# Patient Record
Sex: Female | Born: 1940
Health system: Southern US, Community
[De-identification: ages and names within clinical notes are randomized; demographics above are authoritative.]

## PROBLEM LIST (undated history)

## (undated) DIAGNOSIS — E119 Type 2 diabetes mellitus without complications: Secondary | ICD-10-CM

## (undated) DIAGNOSIS — E785 Hyperlipidemia, unspecified: Secondary | ICD-10-CM

## (undated) DIAGNOSIS — I639 Cerebral infarction, unspecified: Secondary | ICD-10-CM

## (undated) DIAGNOSIS — I10 Essential (primary) hypertension: Secondary | ICD-10-CM

---

## 2021-07-10 DIAGNOSIS — Z23 Encounter for immunization: Secondary | ICD-10-CM | POA: Diagnosis not present

## 2021-09-13 ENCOUNTER — Other Ambulatory Visit: Payer: Self-pay

## 2021-09-13 ENCOUNTER — Emergency Department: Payer: Medicare HMO

## 2021-09-13 ENCOUNTER — Emergency Department
Admission: EM | Admit: 2021-09-13 | Discharge: 2021-09-13 | Disposition: A | Discharge status: Home / Self Care | Payer: Medicare HMO | Attending: Emergency Medicine | Admitting: Emergency Medicine

## 2021-09-13 DIAGNOSIS — R001 Bradycardia, unspecified: Secondary | ICD-10-CM | POA: Diagnosis not present

## 2021-09-13 DIAGNOSIS — R531 Weakness: Secondary | ICD-10-CM | POA: Diagnosis not present

## 2021-09-13 DIAGNOSIS — R4781 Slurred speech: Secondary | ICD-10-CM | POA: Diagnosis not present

## 2021-09-13 DIAGNOSIS — R0689 Other abnormalities of breathing: Secondary | ICD-10-CM | POA: Diagnosis not present

## 2021-09-13 DIAGNOSIS — Z20822 Contact with and (suspected) exposure to covid-19: Secondary | ICD-10-CM | POA: Diagnosis not present

## 2021-09-13 DIAGNOSIS — I10 Essential (primary) hypertension: Secondary | ICD-10-CM | POA: Insufficient documentation

## 2021-09-13 DIAGNOSIS — G9389 Other specified disorders of brain: Secondary | ICD-10-CM | POA: Diagnosis not present

## 2021-09-13 DIAGNOSIS — R0902 Hypoxemia: Secondary | ICD-10-CM | POA: Diagnosis not present

## 2021-09-13 DIAGNOSIS — J3489 Other specified disorders of nose and nasal sinuses: Secondary | ICD-10-CM | POA: Diagnosis not present

## 2021-09-13 DIAGNOSIS — Z9889 Other specified postprocedural states: Secondary | ICD-10-CM | POA: Diagnosis not present

## 2021-09-13 DIAGNOSIS — E119 Type 2 diabetes mellitus without complications: Secondary | ICD-10-CM | POA: Diagnosis not present

## 2021-09-13 DIAGNOSIS — R42 Dizziness and giddiness: Secondary | ICD-10-CM | POA: Insufficient documentation

## 2021-09-13 HISTORY — DX: Essential (primary) hypertension: I10

## 2021-09-13 HISTORY — DX: Type 2 diabetes mellitus without complications: E11.9

## 2021-09-13 LAB — HEPATIC FUNCTION PANEL
ALT: 11 U/L (ref 0–44)
AST: 16 U/L (ref 15–41)
Albumin: 3.2 g/dL — ABNORMAL LOW (ref 3.5–5.0)
Alkaline Phosphatase: 54 U/L (ref 38–126)
Bilirubin, Direct: 0.1 mg/dL (ref 0.0–0.2)
Indirect Bilirubin: 0.6 mg/dL (ref 0.3–0.9)
Total Bilirubin: 0.7 mg/dL (ref 0.3–1.2)
Total Protein: 7.3 g/dL (ref 6.5–8.1)

## 2021-09-13 LAB — BASIC METABOLIC PANEL
Anion gap: 6 (ref 5–15)
BUN: 31 mg/dL — ABNORMAL HIGH (ref 8–23)
CO2: 22 mmol/L (ref 22–32)
Calcium: 8.4 mg/dL — ABNORMAL LOW (ref 8.9–10.3)
Chloride: 110 mmol/L (ref 98–111)
Creatinine, Ser: 2.02 mg/dL — ABNORMAL HIGH (ref 0.44–1.00)
GFR, Estimated: 25 mL/min — ABNORMAL LOW (ref >60)
Glucose, Bld: 187 mg/dL — ABNORMAL HIGH (ref 70–99)
Potassium: 3.4 mmol/L — ABNORMAL LOW (ref 3.5–5.1)
Sodium: 138 mmol/L (ref 135–145)

## 2021-09-13 LAB — URINALYSIS, ROUTINE W REFLEX MICROSCOPIC
Bilirubin Urine: NEGATIVE
Glucose, UA: NEGATIVE mg/dL
Ketones, ur: NEGATIVE mg/dL
Nitrite: NEGATIVE
Protein, ur: >300 mg/dL — AB
Specific Gravity, Urine: 1.02 (ref 1.005–1.030)
pH: 5.5 (ref 5.0–8.0)

## 2021-09-13 LAB — RESP PANEL BY RT-PCR (FLU A&B, COVID) ARPGX2
Influenza A by PCR: NEGATIVE
Influenza B by PCR: NEGATIVE
SARS Coronavirus 2 by RT PCR: NEGATIVE

## 2021-09-13 LAB — CBC
HCT: 34.1 % — ABNORMAL LOW (ref 36.0–46.0)
Hemoglobin: 10.8 g/dL — ABNORMAL LOW (ref 12.0–15.0)
MCH: 29.5 pg (ref 26.0–34.0)
MCHC: 31.7 g/dL (ref 30.0–36.0)
MCV: 93.2 fL (ref 80.0–100.0)
Platelets: 146 10*3/uL — ABNORMAL LOW (ref 150–400)
RBC: 3.66 MIL/uL — ABNORMAL LOW (ref 3.87–5.11)
RDW: 14.9 % (ref 11.5–15.5)
WBC: 7.8 10*3/uL (ref 4.0–10.5)
nRBC: 0 % (ref 0.0–0.2)

## 2021-09-13 LAB — TROPONIN I (HIGH SENSITIVITY): Troponin I (High Sensitivity): 10 ng/L (ref <18)

## 2021-09-13 LAB — LIPASE, BLOOD: Lipase: 47 U/L (ref 11–51)

## 2021-09-13 LAB — CBG MONITORING, ED: Glucose-Capillary: 184 mg/dL — ABNORMAL HIGH (ref 70–99)

## 2021-09-13 MED ORDER — SODIUM CHLORIDE 0.9 % IV BOLUS
500.0000 mL | Freq: Once | INTRAVENOUS | Status: AC
  Administered 2021-09-13: 06:00:00 500 mL via INTRAVENOUS

## 2021-09-13 NOTE — ED Notes (Signed)
Patient transported to CT 

## 2021-09-13 NOTE — ED Provider Notes (Signed)
[facility] Emergency Department Provider Note   ____________________________________________   I have reviewed the triage vital signs and the nursing notes.   HISTORY  Chief Complaint Weakness   History limited by: Not Limited   HPI Alicia Garner is a 80 y.o. female who presents to the emergency department today because of concerns for dizziness and weakness that occurred while the patient was using the restroom this morning.  Patient states she felt okay prior to using the restroom. Family stated that when this happened they did notice some slurred speech. The patient had similar episode roughly 6 months previously. No recent illness. The patient denies any chest pain, shortness of breath or headache. States that she feels better at the time of my exam.   Records reviewed. Per medical record review patient has a history of DM, HTN.  Past Medical History:  Diagnosis Date   Diabetes mellitus without complication ([place])    Hypertension     There are no problems to display for this patient.   History reviewed. No pertinent surgical history.  Prior to Admission medications   Not on File    Allergies Patient has no known allergies.  History reviewed. No pertinent family history.  Social History Social History   Tobacco Use   Smoking status: Never   Smokeless tobacco: Never  Vaping Use   Vaping Use: Never used  Substance Use Topics   Alcohol use: Never   Drug use: Never    Review of Systems Constitutional: No fever/chills Eyes: No visual changes. ENT: No sore throat. Cardiovascular: Denies chest pain. Respiratory: Denies shortness of breath. Gastrointestinal: No abdominal pain.  No nausea, no vomiting.  No diarrhea.   Genitourinary: Negative for dysuria. Musculoskeletal: Negative for back pain. Skin: Negative for rash. Neurological: Positive for dizziness, generalized  weakness.  ____________________________________________   PHYSICAL EXAM:  VITAL SIGNS: ED Triage Vitals  Enc Vitals Group     BP 09/13/21 0428 (!) 154/84     Pulse Rate 09/13/21 0428 93     Resp 09/13/21 0428 18     Temp 09/13/21 0428 98.4 F (36.9 C)     Temp Source 09/13/21 0428 Oral     SpO2 09/13/21 0428 96 %     Weight 09/13/21 0429 160 lb (72.6 kg)     Height 09/13/21 0429 5\' 4"  (1.626 m)     Head Circumference --      Peak Flow --      Pain Score 09/13/21 0429 0   Constitutional: Alert and oriented.  Eyes: Conjunctivae are normal.  ENT      Head: Normocephalic and atraumatic.      Nose: No congestion/rhinnorhea.      Mouth/Throat: Mucous membranes are moist.      Neck: No stridor. Hematological/Lymphatic/Immunilogical: No cervical lymphadenopathy. Cardiovascular: Normal rate, regular rhythm.  No murmurs, rubs, or gallops.  Respiratory: Normal respiratory effort without tachypnea nor retractions. Breath sounds are clear and equal bilaterally. No wheezes/rales/rhonchi. Gastrointestinal: Soft and non tender. No rebound. No guarding.  Genitourinary: Deferred Musculoskeletal: Normal range of motion in all extremities. No lower extremity edema. Neurologic:  Normal speech and language. No gross focal neurologic deficits are appreciated.  Skin:  Skin is warm, dry and intact. No rash noted. Psychiatric: Mood and affect are normal. Speech and behavior are normal. Patient exhibits appropriate insight and judgment.  ____________________________________________    LABS (pertinent positives/negatives)  Trop hs 10 Lipase 47 BMP na 138, k 3.4, glu 187, cr 2.02  CBC wbc 7.8, hgb 10.8, plt 146  ____________________________________________   EKG  I, Nance Pear, attending physician, personally viewed and interpreted this EKG  EKG Time: 0430 Rate: 92 Rhythm: normal sinus rhythm Axis: left axis deviation Intervals: qtc 479 QRS: narrow, LVH ST changes: no st  elevation Impression: abnormal ekg  ____________________________________________    RADIOLOGY  CXR Low lung volumes, no active disease  ____________________________________________   PROCEDURES  Procedures  ____________________________________________   INITIAL IMPRESSION / ASSESSMENT AND PLAN / ED COURSE  Pertinent labs & imaging results that were available during my care of the patient were reviewed by me and considered in my medical decision making (see chart for details).   Patient presented to the emergency department today because of concerns for an episode of lightheadedness dizziness and family stated they noticed some slurred speech.  Had similar episode roughly 6 months ago.  At time of my exam patient states she does feel better.  Blood work here without any concerning abnormalities.  Potassium is very minimally low but not to the level that I would think would explain the patient's symptoms.  Given family reported history of some slurred speech did check an MRI to evaluate for any acute stroke.  This was negative.  Do wonder if patient had an episode of vasovagal syncope.  Troponin was negative.  Awaiting urine at time of signout.  Additionally patient will will be given IV fluids.  After fluids will have patient ambulate and I do think if patient feels okay would be reasonable for patient be discharged to follow-up with primary care.   ____________________________________________   FINAL CLINICAL IMPRESSION(S) / ED DIAGNOSES  Final diagnoses:  Weakness     Note: This dictation was prepared with Dragon dictation. Any transcriptional errors that result from this process are unintentional     Nance Pear, MD 09/13/21 0730
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
## 2021-09-13 NOTE — ED Notes (Signed)
Patient in MRI 

## 2021-09-13 NOTE — Discharge Instructions (Addendum)
Please seek medical attention for any high fevers, chest pain, shortness of breath, change in behavior, persistent vomiting, bloody stool or any other new or concerning symptoms.  

## 2021-09-13 NOTE — ED Provider Notes (Signed)
----------------------------------------- °  9:50 AM on 09/13/2021 ----------------------------------------- Patient is ambulated well.  Urinalysis does not appear to show urinary tract infection.  Urine culture has been added on as a precaution.  Given the patient's reassuring work-up including a negative MRI we will discharge from the emergency department with PCP follow-up.  Discussed with the patient plenty of fluids and rest of neck several days.  Patient agreeable to plan of care.   Harvest Dark, MD 09/13/21 [phone]

## 2021-09-13 NOTE — ED Notes (Signed)
Urine sent to lab- pt walked to toilet with steady gait

## 2021-09-14 LAB — URINE CULTURE: Culture: 40000 — AB

## 2021-10-07 DIAGNOSIS — R55 Syncope and collapse: Secondary | ICD-10-CM | POA: Diagnosis not present

## 2021-10-07 DIAGNOSIS — R42 Dizziness and giddiness: Secondary | ICD-10-CM | POA: Diagnosis not present

## 2021-10-07 DIAGNOSIS — F0390 Unspecified dementia without behavioral disturbance: Secondary | ICD-10-CM | POA: Diagnosis not present

## 2021-10-07 DIAGNOSIS — R8290 Unspecified abnormal findings in urine: Secondary | ICD-10-CM | POA: Diagnosis not present

## 2021-10-07 DIAGNOSIS — E119 Type 2 diabetes mellitus without complications: Secondary | ICD-10-CM | POA: Diagnosis not present

## 2021-10-14 ENCOUNTER — Emergency Department: Payer: Medicare HMO

## 2021-10-14 ENCOUNTER — Encounter: Payer: Self-pay | Admitting: Emergency Medicine

## 2021-10-14 ENCOUNTER — Emergency Department
Admission: EM | Admit: 2021-10-14 | Discharge: 2021-10-14 | Disposition: A | Discharge status: Home / Self Care | Payer: Medicare HMO | Attending: Emergency Medicine | Admitting: Emergency Medicine

## 2021-10-14 ENCOUNTER — Other Ambulatory Visit: Payer: Self-pay

## 2021-10-14 ENCOUNTER — Ambulatory Visit: Admission: EM | Admit: 2021-10-14 | Discharge: 2021-10-14 | Payer: Medicare HMO

## 2021-10-14 DIAGNOSIS — I10 Essential (primary) hypertension: Secondary | ICD-10-CM

## 2021-10-14 DIAGNOSIS — R410 Disorientation, unspecified: Secondary | ICD-10-CM | POA: Diagnosis present

## 2021-10-14 DIAGNOSIS — E1140 Type 2 diabetes mellitus with diabetic neuropathy, unspecified: Secondary | ICD-10-CM | POA: Insufficient documentation

## 2021-10-14 DIAGNOSIS — T59811A Toxic effect of smoke, accidental (unintentional), initial encounter: Secondary | ICD-10-CM | POA: Insufficient documentation

## 2021-10-14 DIAGNOSIS — F0390 Unspecified dementia without behavioral disturbance: Secondary | ICD-10-CM | POA: Diagnosis not present

## 2021-10-14 DIAGNOSIS — E860 Dehydration: Secondary | ICD-10-CM | POA: Diagnosis not present

## 2021-10-14 DIAGNOSIS — N189 Chronic kidney disease, unspecified: Secondary | ICD-10-CM | POA: Diagnosis not present

## 2021-10-14 DIAGNOSIS — J705 Respiratory conditions due to smoke inhalation: Secondary | ICD-10-CM | POA: Insufficient documentation

## 2021-10-14 DIAGNOSIS — I129 Hypertensive chronic kidney disease with stage 1 through stage 4 chronic kidney disease, or unspecified chronic kidney disease: Secondary | ICD-10-CM | POA: Diagnosis not present

## 2021-10-14 DIAGNOSIS — R0602 Shortness of breath: Secondary | ICD-10-CM | POA: Diagnosis not present

## 2021-10-14 LAB — CBC WITH DIFFERENTIAL/PLATELET
Abs Immature Granulocytes: 0.02 10*3/uL (ref 0.00–0.07)
Basophils Absolute: 0 10*3/uL (ref 0.0–0.1)
Basophils Relative: 0 %
Eosinophils Absolute: 0.1 10*3/uL (ref 0.0–0.5)
Eosinophils Relative: 1 %
HCT: 34.5 % — ABNORMAL LOW (ref 36.0–46.0)
Hemoglobin: 11.1 g/dL — ABNORMAL LOW (ref 12.0–15.0)
Immature Granulocytes: 0 %
Lymphocytes Relative: 57 %
Lymphs Abs: 3.9 10*3/uL (ref 0.7–4.0)
MCH: 29.8 pg (ref 26.0–34.0)
MCHC: 32.2 g/dL (ref 30.0–36.0)
MCV: 92.7 fL (ref 80.0–100.0)
Monocytes Absolute: 0.7 10*3/uL (ref 0.1–1.0)
Monocytes Relative: 10 %
Neutro Abs: 2.1 10*3/uL (ref 1.7–7.7)
Neutrophils Relative %: 32 %
Platelets: 168 10*3/uL (ref 150–400)
RBC: 3.72 MIL/uL — ABNORMAL LOW (ref 3.87–5.11)
RDW: 14.7 % (ref 11.5–15.5)
WBC: 6.7 10*3/uL (ref 4.0–10.5)
nRBC: 0 % (ref 0.0–0.2)

## 2021-10-14 LAB — BASIC METABOLIC PANEL
Anion gap: 10 (ref 5–15)
BUN: 35 mg/dL — ABNORMAL HIGH (ref 8–23)
CO2: 22 mmol/L (ref 22–32)
Calcium: 9.1 mg/dL (ref 8.9–10.3)
Chloride: 108 mmol/L (ref 98–111)
Creatinine, Ser: 2.46 mg/dL — ABNORMAL HIGH (ref 0.44–1.00)
GFR, Estimated: 19 mL/min — ABNORMAL LOW (ref >60)
Glucose, Bld: 123 mg/dL — ABNORMAL HIGH (ref 70–99)
Potassium: 3.7 mmol/L (ref 3.5–5.1)
Sodium: 140 mmol/L (ref 135–145)

## 2021-10-14 LAB — COOXEMETRY PANEL
Carboxyhemoglobin: 1.3 % (ref 0.5–1.5)
Methemoglobin: 0.2 % (ref 0.0–1.5)
O2 Saturation: 98.1 %
Total oxygen content: 98.1 mL/dL

## 2021-10-14 LAB — LACTIC ACID, PLASMA
Lactic Acid, Venous: 2.5 mmol/L (ref 0.5–1.9)
Lactic Acid, Venous: 1.7 mmol/L (ref 0.5–1.9)

## 2021-10-14 MED ORDER — LACTATED RINGERS IV BOLUS
1000.0000 mL | Freq: Once | INTRAVENOUS | Status: AC
  Administered 2021-10-14: 21:00:00 1000 mL via INTRAVENOUS
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
## 2021-10-28 DIAGNOSIS — T59811D Toxic effect of smoke, accidental (unintentional), subsequent encounter: Secondary | ICD-10-CM | POA: Diagnosis not present

## 2021-10-28 DIAGNOSIS — R8290 Unspecified abnormal findings in urine: Secondary | ICD-10-CM | POA: Diagnosis not present

## 2021-10-28 DIAGNOSIS — I10 Essential (primary) hypertension: Secondary | ICD-10-CM | POA: Diagnosis not present

## 2021-10-28 DIAGNOSIS — T59811A Toxic effect of smoke, accidental (unintentional), initial encounter: Secondary | ICD-10-CM | POA: Diagnosis not present

## 2021-10-28 DIAGNOSIS — R7989 Other specified abnormal findings of blood chemistry: Secondary | ICD-10-CM | POA: Diagnosis not present

## 2021-12-04 ENCOUNTER — Encounter: Payer: Self-pay | Admitting: Internal Medicine

## 2021-12-04 ENCOUNTER — Emergency Department: Payer: Medicare HMO

## 2021-12-04 ENCOUNTER — Other Ambulatory Visit: Payer: Self-pay

## 2021-12-04 ENCOUNTER — Inpatient Hospital Stay
Admission: EM | Admit: 2021-12-04 | Discharge: 2021-12-08 | DRG: 683 | Disposition: A | Discharge status: Home Health | Payer: Medicare HMO | Attending: Internal Medicine | Admitting: Internal Medicine

## 2021-12-04 DIAGNOSIS — Z66 Do not resuscitate: Secondary | ICD-10-CM

## 2021-12-04 DIAGNOSIS — Z8249 Family history of ischemic heart disease and other diseases of the circulatory system: Secondary | ICD-10-CM | POA: Diagnosis not present

## 2021-12-04 DIAGNOSIS — R0602 Shortness of breath: Secondary | ICD-10-CM | POA: Diagnosis not present

## 2021-12-04 DIAGNOSIS — M2550 Pain in unspecified joint: Secondary | ICD-10-CM | POA: Diagnosis not present

## 2021-12-04 DIAGNOSIS — N184 Chronic kidney disease, stage 4 (severe): Secondary | ICD-10-CM | POA: Diagnosis present

## 2021-12-04 DIAGNOSIS — Z833 Family history of diabetes mellitus: Secondary | ICD-10-CM | POA: Diagnosis not present

## 2021-12-04 DIAGNOSIS — R410 Disorientation, unspecified: Secondary | ICD-10-CM | POA: Diagnosis not present

## 2021-12-04 DIAGNOSIS — Z79899 Other long term (current) drug therapy: Secondary | ICD-10-CM | POA: Diagnosis not present

## 2021-12-04 DIAGNOSIS — N179 Acute kidney failure, unspecified: Secondary | ICD-10-CM | POA: Diagnosis not present

## 2021-12-04 DIAGNOSIS — R945 Abnormal results of liver function studies: Secondary | ICD-10-CM | POA: Diagnosis not present

## 2021-12-04 DIAGNOSIS — R918 Other nonspecific abnormal finding of lung field: Secondary | ICD-10-CM | POA: Diagnosis not present

## 2021-12-04 DIAGNOSIS — Z8673 Personal history of transient ischemic attack (TIA), and cerebral infarction without residual deficits: Secondary | ICD-10-CM | POA: Diagnosis not present

## 2021-12-04 DIAGNOSIS — M1990 Unspecified osteoarthritis, unspecified site: Secondary | ICD-10-CM | POA: Diagnosis present

## 2021-12-04 DIAGNOSIS — N3001 Acute cystitis with hematuria: Secondary | ICD-10-CM | POA: Diagnosis not present

## 2021-12-04 DIAGNOSIS — D631 Anemia in chronic kidney disease: Secondary | ICD-10-CM | POA: Diagnosis present

## 2021-12-04 DIAGNOSIS — M47814 Spondylosis without myelopathy or radiculopathy, thoracic region: Secondary | ICD-10-CM | POA: Diagnosis not present

## 2021-12-04 DIAGNOSIS — R262 Difficulty in walking, not elsewhere classified: Secondary | ICD-10-CM | POA: Diagnosis present

## 2021-12-04 DIAGNOSIS — M109 Gout, unspecified: Secondary | ICD-10-CM | POA: Diagnosis present

## 2021-12-04 DIAGNOSIS — Z7984 Long term (current) use of oral hypoglycemic drugs: Secondary | ICD-10-CM

## 2021-12-04 DIAGNOSIS — B954 Other streptococcus as the cause of diseases classified elsewhere: Secondary | ICD-10-CM | POA: Diagnosis present

## 2021-12-04 DIAGNOSIS — N189 Chronic kidney disease, unspecified: Secondary | ICD-10-CM | POA: Diagnosis not present

## 2021-12-04 DIAGNOSIS — E785 Hyperlipidemia, unspecified: Secondary | ICD-10-CM | POA: Diagnosis present

## 2021-12-04 DIAGNOSIS — E860 Dehydration: Secondary | ICD-10-CM | POA: Diagnosis not present

## 2021-12-04 DIAGNOSIS — D638 Anemia in other chronic diseases classified elsewhere: Secondary | ICD-10-CM | POA: Diagnosis not present

## 2021-12-04 DIAGNOSIS — Z888 Allergy status to other drugs, medicaments and biological substances status: Secondary | ICD-10-CM

## 2021-12-04 DIAGNOSIS — R000 Tachycardia, unspecified: Secondary | ICD-10-CM

## 2021-12-04 DIAGNOSIS — I10 Essential (primary) hypertension: Secondary | ICD-10-CM

## 2021-12-04 DIAGNOSIS — D649 Anemia, unspecified: Secondary | ICD-10-CM | POA: Diagnosis not present

## 2021-12-04 DIAGNOSIS — E1122 Type 2 diabetes mellitus with diabetic chronic kidney disease: Secondary | ICD-10-CM | POA: Diagnosis present

## 2021-12-04 DIAGNOSIS — M7121 Synovial cyst of popliteal space [Baker], right knee: Secondary | ICD-10-CM | POA: Diagnosis not present

## 2021-12-04 DIAGNOSIS — K5900 Constipation, unspecified: Secondary | ICD-10-CM | POA: Diagnosis not present

## 2021-12-04 DIAGNOSIS — I129 Hypertensive chronic kidney disease with stage 1 through stage 4 chronic kidney disease, or unspecified chronic kidney disease: Secondary | ICD-10-CM | POA: Diagnosis not present

## 2021-12-04 DIAGNOSIS — K760 Fatty (change of) liver, not elsewhere classified: Secondary | ICD-10-CM | POA: Diagnosis present

## 2021-12-04 DIAGNOSIS — E1169 Type 2 diabetes mellitus with other specified complication: Secondary | ICD-10-CM

## 2021-12-04 DIAGNOSIS — R531 Weakness: Secondary | ICD-10-CM | POA: Diagnosis not present

## 2021-12-04 DIAGNOSIS — M7989 Other specified soft tissue disorders: Secondary | ICD-10-CM | POA: Diagnosis not present

## 2021-12-04 DIAGNOSIS — R7989 Other specified abnormal findings of blood chemistry: Secondary | ICD-10-CM

## 2021-12-04 HISTORY — DX: Cerebral infarction, unspecified: I63.9

## 2021-12-04 HISTORY — DX: Hyperlipidemia, unspecified: E78.5

## 2021-12-04 LAB — CK: Total CK: 178 U/L (ref 38–234)

## 2021-12-04 LAB — CBC WITH DIFFERENTIAL/PLATELET
Abs Immature Granulocytes: 0.02 10*3/uL (ref 0.00–0.07)
Basophils Absolute: 0 10*3/uL (ref 0.0–0.1)
Basophils Relative: 0 %
Eosinophils Absolute: 0 10*3/uL (ref 0.0–0.5)
Eosinophils Relative: 0 %
HCT: 34.4 % — ABNORMAL LOW (ref 36.0–46.0)
Hemoglobin: 10.9 g/dL — ABNORMAL LOW (ref 12.0–15.0)
Immature Granulocytes: 0 %
Lymphocytes Relative: 24 %
Lymphs Abs: 1.9 10*3/uL (ref 0.7–4.0)
MCH: 29.2 pg (ref 26.0–34.0)
MCHC: 31.7 g/dL (ref 30.0–36.0)
MCV: 92.2 fL (ref 80.0–100.0)
Monocytes Absolute: 0.5 10*3/uL (ref 0.1–1.0)
Monocytes Relative: 6 %
Neutro Abs: 5.5 10*3/uL (ref 1.7–7.7)
Neutrophils Relative %: 70 %
Platelets: 233 10*3/uL (ref 150–400)
RBC: 3.73 MIL/uL — ABNORMAL LOW (ref 3.87–5.11)
RDW: 14.2 % (ref 11.5–15.5)
WBC: 7.9 10*3/uL (ref 4.0–10.5)
nRBC: 0 % (ref 0.0–0.2)

## 2021-12-04 LAB — URIC ACID: Uric Acid, Serum: 8.4 mg/dL — ABNORMAL HIGH (ref 2.5–7.1)

## 2021-12-04 LAB — URINALYSIS, COMPLETE (UACMP) WITH MICROSCOPIC
Bilirubin Urine: NEGATIVE
Glucose, UA: NEGATIVE mg/dL
Ketones, ur: NEGATIVE mg/dL
Nitrite: NEGATIVE
Protein, ur: 100 mg/dL — AB
Specific Gravity, Urine: 1.014 (ref 1.005–1.030)
pH: 5 (ref 5.0–8.0)

## 2021-12-04 LAB — COMPREHENSIVE METABOLIC PANEL
ALT: 173 U/L — ABNORMAL HIGH (ref 0–44)
AST: 92 U/L — ABNORMAL HIGH (ref 15–41)
Albumin: 2.8 g/dL — ABNORMAL LOW (ref 3.5–5.0)
Alkaline Phosphatase: 258 U/L — ABNORMAL HIGH (ref 38–126)
Anion gap: 12 (ref 5–15)
BUN: 50 mg/dL — ABNORMAL HIGH (ref 8–23)
CO2: 22 mmol/L (ref 22–32)
Calcium: 8.9 mg/dL (ref 8.9–10.3)
Chloride: 103 mmol/L (ref 98–111)
Creatinine, Ser: 3.37 mg/dL — ABNORMAL HIGH (ref 0.44–1.00)
GFR, Estimated: 13 mL/min — ABNORMAL LOW (ref >60)
Glucose, Bld: 220 mg/dL — ABNORMAL HIGH (ref 70–99)
Potassium: 3.9 mmol/L (ref 3.5–5.1)
Sodium: 137 mmol/L (ref 135–145)
Total Bilirubin: 0.8 mg/dL (ref 0.3–1.2)
Total Protein: 8.8 g/dL — ABNORMAL HIGH (ref 6.5–8.1)

## 2021-12-04 LAB — GLUCOSE, CAPILLARY
Glucose-Capillary: 191 mg/dL — ABNORMAL HIGH (ref 70–99)
Glucose-Capillary: 234 mg/dL — ABNORMAL HIGH (ref 70–99)

## 2021-12-04 LAB — LACTIC ACID, PLASMA: Lactic Acid, Venous: 1.1 mmol/L (ref 0.5–1.9)

## 2021-12-04 MED ORDER — SODIUM CHLORIDE 0.9 % IV SOLN
1000.0000 mL | Freq: Once | INTRAVENOUS | Status: AC
  Administered 2021-12-04: 1000 mL via INTRAVENOUS

## 2021-12-04 MED ORDER — METHYLPREDNISOLONE SODIUM SUCC 40 MG IJ SOLR
40.0000 mg | INTRAMUSCULAR | Status: AC
  Administered 2021-12-04: 40 mg via INTRAVENOUS
  Filled 2021-12-04: qty 1

## 2021-12-04 MED ORDER — SODIUM CHLORIDE 0.9 % IV SOLN
1.0000 g | INTRAVENOUS | Status: DC
  Administered 2021-12-04 – 2021-12-08 (×5): 1 g via INTRAVENOUS
  Filled 2021-12-04: qty 10
  Filled 2021-12-04: qty 1
  Filled 2021-12-04 (×2): qty 10
  Filled 2021-12-04: qty 1

## 2021-12-04 MED ORDER — PREDNISONE 20 MG PO TABS
20.0000 mg | ORAL_TABLET | Freq: Every day | ORAL | Status: DC
  Administered 2021-12-05 – 2021-12-07 (×3): 20 mg via ORAL
  Filled 2021-12-04 (×4): qty 1

## 2021-12-04 MED ORDER — ACETAMINOPHEN 325 MG PO TABS: 650.0000 mg | ORAL_TABLET | Freq: Four times a day (QID) | ORAL | Status: DC | PRN

## 2021-12-04 MED ORDER — AMLODIPINE BESYLATE 5 MG PO TABS
10.0000 mg | ORAL_TABLET | Freq: Every day | ORAL | Status: DC
  Administered 2021-12-05 – 2021-12-08 (×4): 10 mg via ORAL
  Filled 2021-12-04 (×4): qty 2

## 2021-12-04 MED ORDER — ONDANSETRON HCL 4 MG/2ML IJ SOLN: 4.0000 mg | Freq: Four times a day (QID) | INTRAMUSCULAR | Status: DC | PRN

## 2021-12-04 MED ORDER — COLCHICINE 0.6 MG PO TABS
0.6000 mg | ORAL_TABLET | Freq: Once | ORAL | Status: AC
  Administered 2021-12-04: 0.6 mg via ORAL
  Filled 2021-12-04: qty 1

## 2021-12-04 MED ORDER — ACETAMINOPHEN 650 MG RE SUPP: 650.0000 mg | Freq: Four times a day (QID) | RECTAL | Status: DC | PRN

## 2021-12-04 MED ORDER — HEPARIN SODIUM (PORCINE) 5000 UNIT/ML IJ SOLN
5000.0000 [IU] | Freq: Three times a day (TID) | INTRAMUSCULAR | Status: DC
  Administered 2021-12-04 – 2021-12-08 (×11): 5000 [IU] via SUBCUTANEOUS
  Filled 2021-12-04 (×11): qty 1

## 2021-12-04 MED ORDER — LINAGLIPTIN 5 MG PO TABS
5.0000 mg | ORAL_TABLET | Freq: Every day | ORAL | Status: DC
  Administered 2021-12-05 – 2021-12-08 (×4): 5 mg via ORAL
  Filled 2021-12-04 (×4): qty 1

## 2021-12-04 MED ORDER — INSULIN ASPART 100 UNIT/ML IJ SOLN
0.0000 [IU] | Freq: Every day | INTRAMUSCULAR | Status: DC
  Administered 2021-12-04 – 2021-12-05 (×2): 2 [IU] via SUBCUTANEOUS
  Administered 2021-12-06 – 2021-12-07 (×2): 3 [IU] via SUBCUTANEOUS
  Filled 2021-12-04 (×4): qty 1

## 2021-12-04 MED ORDER — ASPIRIN EC 81 MG PO TBEC
81.0000 mg | DELAYED_RELEASE_TABLET | Freq: Every day | ORAL | Status: DC
  Administered 2021-12-04 – 2021-12-08 (×5): 81 mg via ORAL
  Filled 2021-12-04 (×5): qty 1

## 2021-12-04 MED ORDER — INSULIN ASPART 100 UNIT/ML IJ SOLN
0.0000 [IU] | Freq: Three times a day (TID) | INTRAMUSCULAR | Status: DC
  Administered 2021-12-04: 1 [IU] via SUBCUTANEOUS
  Administered 2021-12-05 (×2): 2 [IU] via SUBCUTANEOUS
  Administered 2021-12-05 – 2021-12-06 (×2): 1 [IU] via SUBCUTANEOUS
  Administered 2021-12-06 – 2021-12-07 (×2): 2 [IU] via SUBCUTANEOUS
  Filled 2021-12-04 (×7): qty 1

## 2021-12-04 MED ORDER — SODIUM CHLORIDE 0.9 % IV SOLN
INTRAVENOUS | Status: DC
Start: 2021-12-04 — End: 2021-12-06

## 2021-12-04 MED ORDER — ONDANSETRON HCL 4 MG PO TABS: 4.0000 mg | ORAL_TABLET | Freq: Four times a day (QID) | ORAL | Status: DC | PRN

## 2021-12-04 MED ORDER — COLCHICINE 0.3 MG HALF TABLET
0.3000 mg | ORAL_TABLET | Freq: Every evening | ORAL | Status: DC
  Filled 2021-12-04: qty 1

## 2021-12-04 NOTE — ED Notes (Signed)
I&O cath patient for urine ?

## 2021-12-04 NOTE — Assessment & Plan Note (Deleted)
Patient made a DNR ?

## 2021-12-04 NOTE — ED Triage Notes (Signed)
BIB EMS from home fo rincreased weakness x 5 days. Home health care person on scene called EMS. Pt is oriented at baseline due to HX of dementia. denies any pain. Wheelchair bound at home.  ?194 BGL - diabetic ?98.7 T ?149/89 ?101 ?96% on RA. ?

## 2021-12-04 NOTE — Assessment & Plan Note (Addendum)
Patient does have a history of arthritis but with uric acid level being 8.4 could be gout.  Patient given Solu-Medrol on admission and given 20 mg of prednisone for the last 2 days.  Will taper down to 10 mg of prednisone.  Colchicine dosed every 3 days secondary to kidney function. ?

## 2021-12-04 NOTE — Assessment & Plan Note (Addendum)
Continue Norvasc.  Hold Cozaar with acute kidney injury.  Added Toprol-XL with fast heart rate with ambulation. ?

## 2021-12-04 NOTE — Assessment & Plan Note (Signed)
Hold Crestor at this point.  Continue sliding scale insulin.  Continue Januvia substitute while here. ?

## 2021-12-04 NOTE — ED Notes (Signed)
Informed RN bed assigned 

## 2021-12-04 NOTE — Assessment & Plan Note (Addendum)
Acute kidney injury on chronic kidney disease stage IV.  Baseline creatinine around 2.46.  Creatinine on admission 3.37.  Creatinine has come down to 2.59. ?

## 2021-12-04 NOTE — Progress Notes (Signed)
Admission profile updated. ?

## 2021-12-04 NOTE — Assessment & Plan Note (Addendum)
Continue to hold Crestor.  Ultrasound right upper quadrant shows fatty infiltration and gallbladder normal.  Hepatitis profile is negative.  LFTs still elevated today. ?

## 2021-12-04 NOTE — H&P (Signed)
?History and Physical  ? ? ?Patient: Alicia Garner VZD:[id number] DOB: 1941-05-18 ?DOA: 12/04/2021 ?DOS: the patient was seen and examined on 12/04/2021 ?PCP: [facility]  ?Patient coming from: Home ? ?Chief Complaint:  ?Chief Complaint  ?Patient presents with  ? Weakness  ? ?HPI: Alicia Garner is a 81 y.o. female with medical history significant of type 2 diabetes mellitus, essential hypertension, chronic kidney disease arthritis and prior history of stroke.  She presents with 8 days of trouble walking.  Daughter states that she has been having some swelling in her joints and some joint pain.  She has been having decreased appetite.  Not eating or drinking very much.  The patient is a poor historian and states she was brought in by her family.  Hospitalist group was contacted for admission secondary to worsening kidney function than her usual.  Creatinine up at 3.37. ? ?Review of Systems: Review of Systems  ?Constitutional:  Positive for malaise/fatigue. Negative for chills and fever.  ?HENT:  Negative for sore throat.   ?Eyes:  Negative for blurred vision.  ?Respiratory:  Negative for shortness of breath.   ?Cardiovascular:  Negative for chest pain.  ?Gastrointestinal:  Negative for abdominal pain, constipation, diarrhea, nausea and vomiting.  ?Genitourinary:  Negative for dysuria and hematuria.  ?Musculoskeletal:  Positive for joint pain.  ?Skin:  Negative for rash.  ?Neurological:  Negative for dizziness.  ?Psychiatric/Behavioral:  Negative for depression.    ?Past Medical History:  ?Diagnosis Date  ? Diabetes mellitus without complication ([place])   ? Hyperlipidemia   ? Hypertension   ? Stroke [facility])   ? ?History reviewed. No pertinent surgical history. ?Social History:  reports that she has never smoked. She has never used smokeless tobacco. She reports that she does not drink alcohol and does not use drugs. ? ?Allergies  ?Allergen Reactions  ? Lisinopril Swelling  ? ? ?Family History  ?Problem Relation Age of  Onset  ? Hypertension Mother   ? Diabetes Mother   ? Diabetes Father   ? Hypertension Father   ? ? ?Prior to Admission medications   ?Medication Sig Start Date End Date Taking? Authorizing Provider  ?amLODipine (NORVASC) 10 MG tablet Take 10 mg by mouth daily. 10/10/21  Yes [provider]  ?Cholecalciferol 25 MCG (1000 UT) capsule Take by mouth. 12/22/18  Yes [provider]  ?losartan (COZAAR) 50 MG tablet Take 50 mg by mouth daily. 10/10/21  Yes [provider]  ?rosuvastatin (CRESTOR) 20 MG tablet Take by mouth. 09/18/21 09/18/22 Yes [provider]  ?sitaGLIPtin (JANUVIA) 25 MG tablet Take 1 tablet (25 mg total) by mouth once daily For diabetes 04/30/21  Yes [provider]  ?Medication reconciliation still undergoing. ? ?Physical Exam: ?Vitals:  ? 12/04/21 1130 12/04/21 1200 12/04/21 1230 12/04/21 1300  ?BP: (!) 143/80 131/77 (!) 144/97 134/75  ?Pulse: 94 95 95 90  ?Resp: 16 19 19 15   ?Temp:      ?TempSrc:      ?SpO2: 98% 98% 99% 96%  ?Weight:      ?Height:      ? ?Physical Exam ?HENT:  ?   Head: Normocephalic.  ?   Mouth/Throat:  ?   Pharynx: No oropharyngeal exudate.  ?Eyes:  ?   General: Lids are normal.  ?   Conjunctiva/sclera: Conjunctivae normal.  ?Cardiovascular:  ?   Rate and Rhythm: Normal rate and regular rhythm.  ?   Heart sounds: Normal heart sounds, S1 normal and S2 normal.  ?  Pulmonary:  ?   Breath sounds: No decreased breath sounds, wheezing, rhonchi or rales.  ?Abdominal:  ?   Palpations: Abdomen is soft.  ?   Tenderness: There is no abdominal tenderness.  ?Musculoskeletal:  ?   Right ankle: Swelling present.  ?   Left ankle: Swelling present.  ?Skin: ?   General: Skin is warm.  ?   Findings: No rash.  ?Neurological:  ?   Mental Status: She is alert.  ?   Comments: Difficulty with straight leg raise.  ?  ?Data Reviewed: ?Laboratory and radiological data reviewed during this hospital course.  Urine analysis positive.  EKG interpreted by me normal sinus  rhythm 95 bpm LVH, LAFB.  Chest x-ray negative.  Hemoglobin 10.9 BUN 15 creatinine 3.37 AST and ALT slightly elevated at 92 and 173, alkaline phosphatase up at 258.  Total bilirubin normal range. ? ?Assessment and Plan: ?* Acute kidney injury superimposed on CKD ([place]) ?Acute kidney injury on chronic kidney disease stage IV.  Baseline creatinine around 2.46.  Creatinine on admission 3.37.  We will give gentle IV fluid hydration. ? ?Acute cystitis with hematuria ?Send off urine culture and start IV Rocephin. ? ?Elevated liver function tests ?Hold Crestor and check LFTs tomorrow. ? ?Pain in joint, multiple sites ?We will give a dose of Solu-Medrol, physical therapy consultation.  Add on a CPK and uric acid. ? ?Type 2 diabetes mellitus with hyperlipidemia ([place]) ?Hold Crestor at this point.  Continue sliding scale insulin.  Continue Januvia substitute while here. ? ?Essential hypertension ?Continue Norvasc.  Hold Cozaar with acute kidney injury. ? ?DNR (do not resuscitate) ?Patient made a DNR ? ? ?Advance Care Planning:   Code Status: DNR  ? ?Family Communication: Spoke with daughter at the bedside ? ?Severity of Illness: ?The appropriate patient status for this patient is INPATIENT. Inpatient status is judged to be reasonable and necessary in order to provide the required intensity of service to ensure the patient's safety. The patient's presenting symptoms, physical exam findings, and initial radiographic and laboratory data in the context of their chronic comorbidities is felt to place them at high risk for further clinical deterioration. Furthermore, it is not anticipated that the patient will be medically stable for discharge from the hospital within 2 midnights of admission.  ? ?* I certify that at the point of admission it is my clinical judgment that the patient will require inpatient hospital care spanning beyond 2 midnights from the point of admission due to high intensity of service, high risk for further  deterioration and high frequency of surveillance required.* ? ?Author: ?Loletha Grayer, MD ?12/04/2021 2:40 PM ? ?For on call review [email or web address].  ?

## 2021-12-04 NOTE — Assessment & Plan Note (Addendum)
Streptococcus anginosus growing out of urine culture.  Continue Rocephin.   ?

## 2021-12-04 NOTE — ED Provider Notes (Signed)
? ?[facility] ?Provider Note ? ? ? Event Date/Time  ? First MD Initiated Contact with Patient 12/04/21 1110   ?  (approximate) ? ? ?History  ? ?Weakness ? ? ?HPI ? ?Alicia Garner is a 81 y.o. female with a history of diabetes, hypertension, CVA who presents with weakness.  Caregiver reports the patient typically is able to ambulate to the bathroom but over the last several days has been unable to get out of the bed.  No fevers reported but has noted foul-smelling urine.  No new acute weakness ?  ? ? ?Physical Exam  ? ?Triage Vital Signs: ?ED Triage Vitals  ?Enc Vitals Group  ?   BP 12/04/21 1110 (!) 143/78  ?   Pulse Rate 12/04/21 1107 100  ?   Resp 12/04/21 1107 18  ?   Temp 12/04/21 1107 99 ?F (37.2 ?C)  ?   Temp Source 12/04/21 1107 Oral  ?   SpO2 12/04/21 1107 99 %  ?   Weight 12/04/21 1108 72.6 kg (160 lb 0.9 oz)  ?   Height 12/04/21 1108 1.626 m (5\' 4" )  ?   Head Circumference --   ?   Peak Flow --   ?   Pain Score --   ?   Pain Loc --   ?   Pain Edu? --   ?   Excl. in [place]? --   ? ? ?Most recent vital signs: ?Vitals:  ? 12/04/21 1330 12/04/21 1430  ?BP: 132/80 (!) 136/91  ?Pulse: 90 88  ?Resp: 16 16  ?Temp:  99 ?F (37.2 ?C)  ?SpO2: 98% 98%  ? ? ? ?General: Awake, no distress.  ?CV:  Good peripheral perfusion.  ?Resp:  Normal effort.  ?Abd:  No distention.  ?Other:   ? ? ?ED Results / Procedures / Treatments  ? ?Labs ?(all labs ordered are listed, but only abnormal results are displayed) ?Labs Reviewed  ?COMPREHENSIVE METABOLIC PANEL - Abnormal; Notable for the following components:  ?    Result Value  ? Glucose, Bld 220 (*)   ? BUN 50 (*)   ? Creatinine, Ser 3.37 (*)   ? Total Protein 8.8 (*)   ? Albumin 2.8 (*)   ? AST 92 (*)   ? ALT 173 (*)   ? Alkaline Phosphatase 258 (*)   ? GFR, Estimated 13 (*)   ? All other components within normal limits  ?CBC WITH DIFFERENTIAL/PLATELET - Abnormal; Notable for the following components:  ? RBC 3.73 (*)   ? Hemoglobin 10.9 (*)   ? HCT 34.4 (*)   ? All  other components within normal limits  ?URINALYSIS, COMPLETE (UACMP) WITH MICROSCOPIC - Abnormal; Notable for the following components:  ? Color, Urine YELLOW (*)   ? APPearance CLOUDY (*)   ? Hgb urine dipstick SMALL (*)   ? Protein, ur 100 (*)   ? Leukocytes,Ua LARGE (*)   ? Bacteria, UA FEW (*)   ? All other components within normal limits  ?URIC ACID - Abnormal; Notable for the following components:  ? Uric Acid, Serum 8.4 (*)   ? All other components within normal limits  ?URINE CULTURE  ?LACTIC ACID, PLASMA  ?CK  ?HEMOGLOBIN A1C  ? ? ? ?EKG ? ?ED ECG REPORT ?I, Lavonia Drafts, the attending physician, personally viewed and interpreted this ECG. ? ?Date: 12/04/2021 ? ?Rhythm: normal sinus rhythm ?QRS Axis: normal ?Intervals: normal ?ST/T Wave abnormalities: normal ?Narrative Interpretation: no evidence of acute  ischemia ? ? ? ?RADIOLOGY ?Chest x-ray viewed interpreted by me, no acute abnormality ? ? ? ?PROCEDURES: ? ?Critical Care performed:  ? ?Procedures ? ? ?MEDICATIONS ORDERED IN ED: ?Medications  ?methylPREDNISolone sodium succinate (SOLU-MEDROL) 40 mg/mL injection 40 mg (has no administration in time range)  ?cefTRIAXone (ROCEPHIN) 1 g in sodium chloride 0.9 % 100 mL IVPB (has no administration in time range)  ?amLODipine (NORVASC) tablet 10 mg (has no administration in time range)  ?linagliptin (TRADJENTA) tablet 5 mg (has no administration in time range)  ?aspirin EC tablet 81 mg (has no administration in time range)  ?insulin aspart (novoLOG) injection 0-6 Units (has no administration in time range)  ?insulin aspart (novoLOG) injection 0-5 Units (has no administration in time range)  ?heparin injection 5,000 Units (has no administration in time range)  ?acetaminophen (TYLENOL) tablet 650 mg (has no administration in time range)  ?  Or  ?acetaminophen (TYLENOL) suppository 650 mg (has no administration in time range)  ?ondansetron (ZOFRAN) tablet 4 mg (has no administration in time range)  ?  Or   ?ondansetron (ZOFRAN) injection 4 mg (has no administration in time range)  ?0.9 %  sodium chloride infusion (has no administration in time range)  ?0.9 %  sodium chloride infusion (1,000 mLs Intravenous New Bag/Given 12/04/21 1430)  ? ? ? ?IMPRESSION / MDM / ASSESSMENT AND PLAN / ED COURSE  ?I reviewed the triage vital signs and the nursing notes. ? ? ?Patient presents with generalized weakness as detailed above.  Overall well-appearing and in no acute distress, will obtain labs, urine, chest x-ray ? ?Lab work is notable for elevated BUN of 50, creatinine of 3.37, this is significantly increased from her baseline of about 2 ? ? ?Lactic acid is normal, white blood cell count is normal. ? ?Patient will require admission for acute on chronic kidney disease, IV fluids have been started.  I have discussed with the hospitalist for admission ? ?  ? ? ?FINAL CLINICAL IMPRESSION(S) / ED DIAGNOSES  ? ?Final diagnoses:  ?AKI (acute kidney injury) ([place])  ?Generalized weakness  ?Dehydration  ? ? ? ?Rx / DC Orders  ? ?ED Discharge Orders   ? ? None  ? ?  ? ? ? ?Note:  This document was prepared using Dragon voice recognition software and may include unintentional dictation errors. ?  ?Lavonia Drafts, MD ?12/04/21 1505 ? ?

## 2021-12-05 DIAGNOSIS — M2550 Pain in unspecified joint: Secondary | ICD-10-CM | POA: Diagnosis not present

## 2021-12-05 DIAGNOSIS — D638 Anemia in other chronic diseases classified elsewhere: Secondary | ICD-10-CM

## 2021-12-05 DIAGNOSIS — N179 Acute kidney failure, unspecified: Secondary | ICD-10-CM | POA: Diagnosis not present

## 2021-12-05 DIAGNOSIS — N3001 Acute cystitis with hematuria: Secondary | ICD-10-CM | POA: Diagnosis not present

## 2021-12-05 DIAGNOSIS — R7989 Other specified abnormal findings of blood chemistry: Secondary | ICD-10-CM | POA: Diagnosis not present

## 2021-12-05 DIAGNOSIS — D649 Anemia, unspecified: Secondary | ICD-10-CM

## 2021-12-05 LAB — GLUCOSE, CAPILLARY
Glucose-Capillary: 170 mg/dL — ABNORMAL HIGH (ref 70–99)
Glucose-Capillary: 221 mg/dL — ABNORMAL HIGH (ref 70–99)
Glucose-Capillary: 224 mg/dL — ABNORMAL HIGH (ref 70–99)
Glucose-Capillary: 210 mg/dL — ABNORMAL HIGH (ref 70–99)

## 2021-12-05 LAB — COMPREHENSIVE METABOLIC PANEL
ALT: 178 U/L — ABNORMAL HIGH (ref 0–44)
AST: 107 U/L — ABNORMAL HIGH (ref 15–41)
Albumin: 2.2 g/dL — ABNORMAL LOW (ref 3.5–5.0)
Alkaline Phosphatase: 249 U/L — ABNORMAL HIGH (ref 38–126)
Anion gap: 9 (ref 5–15)
BUN: 51 mg/dL — ABNORMAL HIGH (ref 8–23)
CO2: 22 mmol/L (ref 22–32)
Calcium: 8.9 mg/dL (ref 8.9–10.3)
Chloride: 109 mmol/L (ref 98–111)
Creatinine, Ser: 2.99 mg/dL — ABNORMAL HIGH (ref 0.44–1.00)
GFR, Estimated: 15 mL/min — ABNORMAL LOW (ref >60)
Glucose, Bld: 191 mg/dL — ABNORMAL HIGH (ref 70–99)
Potassium: 4.5 mmol/L (ref 3.5–5.1)
Sodium: 140 mmol/L (ref 135–145)
Total Bilirubin: 0.5 mg/dL (ref 0.3–1.2)
Total Protein: 7.6 g/dL (ref 6.5–8.1)

## 2021-12-05 LAB — CBC
HCT: 29.8 % — ABNORMAL LOW (ref 36.0–46.0)
Hemoglobin: 9.6 g/dL — ABNORMAL LOW (ref 12.0–15.0)
MCH: 29.4 pg (ref 26.0–34.0)
MCHC: 32.2 g/dL (ref 30.0–36.0)
MCV: 91.1 fL (ref 80.0–100.0)
Platelets: 207 10*3/uL (ref 150–400)
RBC: 3.27 MIL/uL — ABNORMAL LOW (ref 3.87–5.11)
RDW: 13.9 % (ref 11.5–15.5)
WBC: 7.9 10*3/uL (ref 4.0–10.5)
nRBC: 0 % (ref 0.0–0.2)

## 2021-12-05 LAB — IRON AND TIBC
Iron: 34 ug/dL (ref 28–170)
Saturation Ratios: 23 % (ref 10.4–31.8)
TIBC: 147 ug/dL — ABNORMAL LOW (ref 250–450)
UIBC: 113 ug/dL

## 2021-12-05 LAB — FERRITIN: Ferritin: 695 ng/mL — ABNORMAL HIGH (ref 11–307)

## 2021-12-05 MED ORDER — COLCHICINE 0.6 MG PO TABS
0.3000 mg | ORAL_TABLET | ORAL | Status: DC
  Administered 2021-12-07: 0.3 mg via ORAL
  Filled 2021-12-05: qty 0.5

## 2021-12-05 NOTE — Evaluation (Addendum)
Physical Therapy Evaluation ?Patient Details ?Name: Alicia Garner ?MRN: [id number] ?DOB: 1941/09/15 ?Today's Date: 12/05/2021 ? ?History of Present Illness ? Pepper Kerrick is a 81 y.o. female with medical history significant of type 2 diabetes mellitus, essential hypertension, chronic kidney disease arthritis and prior history of stroke. Patient came to hospital due to 8 days of trouble walking. Patient is poor historian. Patient admited for acute kidney injury superimposed on CKD stage IV as well as acute cystitis with hematuria and generalized weakness. ?  ?Clinical Impression ? Patient working with OT at edge of bed upon arrival. Proceeded with Co-treat with OT during transfers away from bed and with ambulation due to complexity of condition and safety with transfers. Patient with dementia at baseline and history obtained from daughter Elwin Sleight who is at bedside throughout session. Patient lives with her daughter who works nights. Patient usually sleeps during the night and is assisted by her daughter during the day. She has had no falls in the last 6 months but has gotten weaker and unable to ambulate without assistance in the last week. Prior to hospitalization she was able to ambulate in the home with no AD or with her rollator and use the bathroom. She needed assistance with ADLs and IADLs. She has a low bed and low toilet. Upon PT eval, patient required min A +2 for lines management and safety to complete sit <> stand transfer from bed to chair. She needs assistance to prevent falls when on her feet due to backwards lean. She required cuing for AD/body placement. She ambulated ~ 35 feet with min A +2 (PT assisting patient with balance and RW use, OT with close chair follow and managing lines). Patient takes very small steps and slow gait and it at risk for falling backwards. It appears that patient has experienced a decline in functional mobility and independence and would benefit from short term rehab prior to return  home or would need constant attendance at home with physical assist for transfers and gait. Patient would benefit from skilled physical therapy to address impairments and functional limitations (see PT Problem List below) to work towards stated goals and return to PLOF or maximal functional independence.  ? ?   ? ?Recommendations for follow up therapy are one component of a multi-disciplinary discharge planning process, led by the attending physician.  Recommendations may be updated based on patient status, additional functional criteria and insurance authorization. ? ?Follow Up Recommendations Skilled nursing-short term rehab (<3 hours/day) ? ?  ?Assistance Recommended at Discharge Frequent or constant Supervision/Assistance  ?Patient can return home with the following ? A lot of help with walking and/or transfers;A lot of help with bathing/dressing/bathroom;Assist for transportation;Assistance with cooking/housework ? ?  ?Equipment Recommendations BSC/3in1;Rolling walker (2 wheels)  ?Recommendations for Other Services ?    ?  ?Functional Status Assessment Patient has had a recent decline in their functional status and demonstrates the ability to make significant improvements in function in a reasonable and predictable amount of time.  ? ?  ?Precautions / Restrictions Precautions ?Precautions: Fall ?Restrictions ?Weight Bearing Restrictions: No  ? ?  ? ?Mobility ? Bed Mobility ?  ?  ?  ?  ?  ?  ?  ?General bed mobility comments: patient sitting at EOB with OT upon PT arrival. ?  ? ?Transfers ?Overall transfer level: Needs assistance ?Equipment used: Rolling walker (2 wheels) ?Transfers: Sit to/from Stand ?Sit to Stand: Min assist, +2 safety/equipment ?  ?  ?  ?  ?  ?  General transfer comment: sit <> stand bed to chair with min A+2 for safely and management of lines. Patient demo backwards lean and very small steps. Requires assistance moving RW at times. ?  ? ?Ambulation/Gait ?Ambulation/Gait assistance: Min  assist ?Gait Distance (Feet): 35 Feet ?Assistive device: Rolling walker (2 wheels) ?Gait Pattern/deviations: Decreased stride length, Leaning posteriorly ?  ?  ?  ?General Gait Details: Patient ambulated about 35 feet with min A +2 with one person assisting patient to maintain balance and advance RW at times and 2nd person managing lines and with very close chair follow due to backwards instabiltiy. Patient with weight backwards and at risk for losing balance backwards at any moment and with intermittant assistance from PT to prevent backwards fall. Pateint ambulates very slowly with stooped posture and very short step length, Has difficulty backing up with heavy cuing to touch back of legs to chair before sitting. ? ?Stairs ?  ?  ?  ?  ?  ? ?Wheelchair Mobility ?  ? ?Modified Rankin (Stroke Patients Only) ?  ? ?  ? ?Balance Overall balance assessment: Needs assistance ?Sitting-balance support: Feet supported, Bilateral upper extremity supported ?Sitting balance-Leahy Scale: Poor ?Sitting balance - Comments: patient starts to lean backwards when sitting edge of bed without cuing. ?Postural control: Posterior lean ?Standing balance support: During functional activity, Reliant on assistive device for balance, Bilateral upper extremity supported ?Standing balance-Leahy Scale: Poor ?Standing balance comment: Patient leans backwards while standing/ambulating at RW without cuing. ?  ?  ?  ?  ?  ?  ?  ?  ?  ?  ?  ?   ? ? ? ?Pertinent Vitals/Pain Pain Assessment ?Pain Assessment: Faces ?Faces Pain Scale: No hurt  ? ? ?Home Living Family/patient expects to be discharged to:: Unsure ?Living Arrangements: Children ?Available Help at Discharge: Family;Available PRN/intermittently ?Type of Home: House ?Home Access: Stairs to enter ?  ?Entrance Stairs-Number of Steps: 2 ?  ?Home Layout: One level ?Home Equipment: Rollator (4 wheels) ?Additional Comments: Patient lives with daughter who works nights.  ?  ?Prior Function Prior  Level of Function : Needs assist ?  ?  ?  ?  ?  ?  ?Mobility Comments: Patient ambulates in the home to the bathrom independently using Rollator or no AD. She uses a rollator when going out into the community. ?ADLs Comments: Help with ADLs and IADLs. ?  ? ? ?Hand Dominance  ?   ? ?  ?Extremity/Trunk Assessment  ? Upper Extremity Assessment ?Upper Extremity Assessment: Defer to OT evaluation ?  ? ?Lower Extremity Assessment ?Lower Extremity Assessment: Generalized weakness ?  ? ?Cervical / Trunk Assessment ?Cervical / Trunk Assessment: Kyphotic  ?Communication  ? Communication: No difficulties  ?Cognition Arousal/Alertness: Awake/alert ?Behavior During Therapy: [facility] for tasks assessed/performed ?Overall Cognitive Status: History of cognitive impairments - at baseline ?  ?  ?  ?  ?  ?  ?  ?  ?  ?  ?  ?  ?  ?  ?  ?  ?General Comments: Daughter Elwin Sleight provides history. ?  ?  ? ?  ?General Comments General comments (skin integrity, edema, etc.): HR up to 145 bpm during mobility. ? ?  ?Exercises    ? ?Assessment/Plan  ?  ?PT Assessment Patient needs continued PT services  ?PT Problem List Decreased strength;Cardiopulmonary status limiting activity;Decreased coordination;Decreased cognition;Decreased activity tolerance;Decreased knowledge of use of DME;Decreased balance;Decreased safety awareness;Decreased mobility ? ?   ?  ?PT Treatment Interventions DME  instruction;Balance training;Gait training;Neuromuscular re-education;Cognitive remediation;Stair training;Functional mobility training;Patient/family education;Therapeutic activities;Therapeutic exercise   ? ?PT Goals (Current goals can be found in the Care Plan section)  ?Acute Rehab PT Goals ?Patient Stated Goal: to get better ?PT Goal Formulation: With patient/family ?Time For Goal Achievement: 12/19/21 ?Potential to Achieve Goals: Good ? ?  ?Frequency Min 2X/week ?  ? ? ?Co-evaluation PT/OT/SLP Co-Evaluation/Treatment: Yes ?Reason for Co-Treatment: For  patient/therapist safety;To address functional/ADL transfers ?  ?  ?  ? ? ?  ?AM-PAC PT "6 Clicks" Mobility  ?Outcome Measure Help needed turning from your back to your side while in a flat bed without using bedrails?: A Lit

## 2021-12-05 NOTE — Progress Notes (Signed)
?  Progress Note ? ? ?Patient: Alicia Garner PPI:[id number] DOB: [birth year] DOA: 12/04/2021     1 ?DOS: the patient was seen and examined on 12/05/2021 ?  ? ? ?Assessment and Plan: ?* Acute kidney injury superimposed on CKD ([place]) ?Acute kidney injury on chronic kidney disease stage IV.  Baseline creatinine around 2.46.  Creatinine on admission 3.37.  We will give gentle IV fluid hydration.  Creatinine has come down to 2.99. ? ?Acute cystitis with hematuria ?Send off urine culture and continue IV Rocephin. ? ?Elevated liver function tests ?Hold Crestor.  LFTs still elevated alkaline phosphatase 249 AST 107 and ALT 178.  Will check right upper quadrant sonogram tomorrow morning.  Check hepatitis profiles. ? ?Pain in joint, multiple sites ?Patient does have a history of arthritis but with uric acid level being 8.4 could be gout.  Patient given Solu-Medrol yesterday and will continue prednisone on a daily basis for right now.  Patient given 1 dose of colchicine yesterday we will continue on a daily basis for now.  Physical therapy recommending rehab. ? ?Type 2 diabetes mellitus with hyperlipidemia ([place]) ?Hold Crestor at this point.  Continue sliding scale insulin.  Continue Januvia substitute while here. ? ?Essential hypertension ?Continue Norvasc.  Hold Cozaar with acute kidney injury. ? ?DNR (do not resuscitate) ?DNR ? ?Anemia, unspecified ?We will add on iron studies ? ? ? ? ?  ? ?Subjective: Patient still has some joint pain.  Still with some weakness.  Was not walking for 8 days prior to coming into the hospital.  Poor appetite. ? ?Physical Exam: ?Vitals:  ? 12/04/21 1948 12/05/21 0400 12/05/21 0800 12/05/21 1136  ?BP: (!) 175/90 (!) 149/85 139/89 136/78  ?Pulse: 93 80 73 68  ?Resp:      ?Temp: 99.2 ?F (37.3 ?C) 99 ?F (37.2 ?C) 97.7 ?F (36.5 ?C) 98.2 ?F (36.8 ?C)  ?TempSrc: Oral  Oral Oral  ?SpO2: 100% 100% 100% 100%  ?Weight:      ?Height:      ? ?Physical Exam ?HENT:  ?   Head: Normocephalic.  ?   Mouth/Throat:  ?    Pharynx: No oropharyngeal exudate.  ?Eyes:  ?   General: Lids are normal.  ?   Conjunctiva/sclera: Conjunctivae normal.  ?Cardiovascular:  ?   Rate and Rhythm: Normal rate and regular rhythm.  ?   Heart sounds: Normal heart sounds, S1 normal and S2 normal.  ?Pulmonary:  ?   Breath sounds: No decreased breath sounds, wheezing, rhonchi or rales.  ?Abdominal:  ?   Palpations: Abdomen is soft.  ?   Tenderness: There is no abdominal tenderness.  ?Musculoskeletal:  ?   Right ankle: Swelling present.  ?   Left ankle: Swelling present.  ?Skin: ?   General: Skin is warm.  ?   Findings: No rash.  ?Neurological:  ?   Mental Status: She is alert.  ?   Comments: Difficulty with straight leg raise.  ?  ?Data Reviewed: ?Creatinine down to 2.99, alkaline phosphatase 249, AST 107, ALT 178, total bilirubin 0.5, hemoglobin 9.6 ? ?Family Communication: Spoke with daughter at the bedside ? ?Disposition: ?Status is: Inpatient ?Remains inpatient appropriate because: Physical therapy recommending rehab and treating joint pains with prednisone and colchicine and treating UTI with IV antibiotics and treating acute kidney injury with IV fluids. ? ?Planned Discharge Destination: Rehab ? ?Author: ?Loletha Grayer, MD ?12/05/2021 2:44 PM ? ?For on call review [email or web address].  ?

## 2021-12-05 NOTE — TOC Initial Note (Signed)
Transition of Care (TOC) - Initial/Assessment Note  ? ? ?Patient Details  ?Name: Alicia Garner ?MRN: [id number] ?Date of Birth: [birth year] ? ?Transition of Care (TOC) CM/SW Contact:    ?Quinne Pires E Narissa Beaufort, LCSW ?Phone Number: ?12/05/2021, 3:20 PM ? ?Clinical Narrative:                Call to daughter [name] to discuss SNF rec. ?Patient is from home with [name] who provides transportation. PCP is [facility] in [place] but [name] is trying to switch her to one closer to home. [facility] [place].  ?She stated patient does not want SNF so she will take patient back home, is agreeable to [place], OT, RN, and Aide services. No agency preference. Confirmed home address. Referral made to [facility] with [facility] for [facility]. ?[name] is also agreeable to recs for 3 in 1 and RW. DME ordered through [facility] for delivery to bedside tomorrow 3/19. ? ?Expected Discharge Plan: [place] ?Barriers to Discharge: Continued Medical Work up ? ? ?Patient Goals and CMS Choice ?Patient states their goals for this hospitalization and ongoing recovery are:: home with home health ?CMS Medicare.gov Compare Post Acute Care list provided to:: Patient Represenative (must comment) ?Choice offered to / list presented to : Adult Children ? ?Expected Discharge Plan and Services ?Expected Discharge Plan: [place] ?  ?  ?  ?Living arrangements for the past 2 months: [place] ?                ?DME Arranged: 3-N-1, Walker rolling ?DME Agency: [facility] ?Date DME Agency Contacted: 12/05/21 ?  ?Representative spoke with at DME Agency: Maudie Mercury ?HH Arranged: PT, OT, RN, Nurse's Aide ?[place] Agency: [place] ?Date HH Agency Contacted: 12/05/21 ?  ?Representative spoke with at [place]: Tommi Rumps ? ?Prior Living Arrangements/Services ?Living arrangements for the past 2 months: [place] ?Lives with:: Adult Children ?Patient language and need for interpreter reviewed:: Yes ?Do you  feel safe going back to the place where you live?: Yes      ?Need for Family Participation in Patient Care: Yes (Comment) ?Care giver support system in place?: Yes (comment) ?Current home services: DME ?Criminal Activity/Legal Involvement Pertinent to Current Situation/Hospitalization: No - Comment as needed ? ?Activities of Daily Living ?Home Assistive Devices/Equipment: Gilford Rile (specify type), Dentures (specify type), Eyeglasses, Shower chair with back, Grab bars around toilet, Grab bars in shower ?ADL Screening (condition at time of admission) ?Patient's cognitive ability adequate to safely complete daily activities?: Yes ?Is the patient deaf or have difficulty hearing?: No ?Does the patient have difficulty seeing, even when wearing glasses/contacts?: No ?Does the patient have difficulty concentrating, remembering, or making decisions?: No ?Patient able to express need for assistance with ADLs?: Yes ?Does the patient have difficulty dressing or bathing?: No ?Independently performs ADLs?: No ?Communication: Independent ?Dressing (OT): Independent ?Grooming: Independent ?Feeding: Independent ?Bathing: Needs assistance ?Is this a change from baseline?: Pre-admission baseline ?Toileting: Dependent ?Is this a change from baseline?: Pre-admission baseline ?In/Out Bed: Dependent ?Is this a change from baseline?: Pre-admission baseline ?Walks in Home: Needs assistance ?Is this a change from baseline?: Pre-admission baseline ?Does the patient have difficulty walking or climbing stairs?: Yes ?Weakness of Legs: Both ?Weakness of Arms/Hands: Both ? ?Permission Sought/Granted ?Permission sought to share information with : Customer service manager ?Permission granted to share information with : Yes, Verbal Permission Granted ?   ? Permission granted to share info  w AGENCY: HH, DME agencies ?   ?   ? ?Emotional Assessment ?  ?  ?  ?Orientation: : Fluctuating Orientation (Suspected and/or reported Sundowners) ?Alcohol /  Substance Use: Not Applicable ?Psych Involvement: No (comment) ? ?Admission diagnosis:  Dehydration [E86.0] ?Generalized weakness [R53.1] ?AKI (acute kidney injury) ([place]) [N17.9] ?Acute kidney injury superimposed on CKD ([place]) [N17.9, N18.9] ?Patient Active Problem List  ? Diagnosis Date Noted  ? Anemia, unspecified 12/05/2021  ? Acute kidney injury superimposed on CKD ([place]) 12/04/2021  ? Acute cystitis with hematuria 12/04/2021  ? Elevated liver function tests 12/04/2021  ? Pain in joint, multiple sites 12/04/2021  ? Type 2 diabetes mellitus with hyperlipidemia ([place]) 12/04/2021  ? Essential hypertension 12/04/2021  ? DNR (do not resuscitate) 12/04/2021  ? ?PCP:  [facility] ?Pharmacy:   ?[id number] [place] [place] ?[place] [address] ?Phone: [phone] Fax: [phone] ? ? ? ? ?Social Determinants of Health (SDOH) Interventions ?  ? ?Readmission Risk Interventions ?Readmission Risk Prevention Plan 12/05/2021  ?Transportation Screening Complete  ?PCP or Specialist Appt within 5-7 Days Complete  ?Home Care Screening Complete  ?Medication Review (RN CM) Complete  ? ? ? ?

## 2021-12-05 NOTE — Evaluation (Signed)
Occupational Therapy Evaluation ?Patient Details ?Name: Alicia Garner ?MRN: [id number] ?DOB: Jun 24, 1941 ?Today's Date: 12/05/2021 ? ? ?History of Present Illness Alicia Garner is a 81 y.o. female with medical history significant of type 2 diabetes mellitus, essential hypertension, chronic kidney disease arthritis and prior history of stroke. Patient came to hospital due to 8 days of trouble walking. Patient is poor historian. Patient admited for acute kidney injury superimposed on CKD stage IV as well as acute cystitis with hematuria and generalized weakness.  ? ?Clinical Impression ?  ?[name] was seen for OT evaluation this date, PT joining for end of session. Prior to hospital admission, pt was MOD I for mobility using 4WW as needed. Pt lives with daughter who works night shift, reports assisting with bathing/dressing. Pt presents to acute OT demonstrating impaired ADL performance and functional mobility 2/2 decreased activity tolerance and functional strength/ROM/balance deficits. Pt currently requires MOD A for brief mgmt in standing. MIN A + RW sit<>stand, posterior LOB noted. MIN A x2 + RW for functional mobility ~20 ft, +2 for chair follow and lines mgmt. Requires BUE support standing, unable to progress to standing grooming tasks. Pt would benefit from skilled OT to address noted impairments and functional limitations (see below for any additional details). Upon hospital discharge, recommend STR to maximize pt safety and return to PLOF. ?   ? ?Recommendations for follow up therapy are one component of a multi-disciplinary discharge planning process, led by the attending physician.  Recommendations may be updated based on patient status, additional functional criteria and insurance authorization.  ? ?Follow Up Recommendations ? Skilled nursing-short term rehab (<3 hours/day)  ?  ?Assistance Recommended at Discharge Frequent or constant Supervision/Assistance  ?Patient can return home with the following A lot of  help with walking and/or transfers;A lot of help with bathing/dressing/bathroom;Help with stairs or ramp for entrance ? ?  ?Functional Status Assessment ? Patient has had a recent decline in their functional status and demonstrates the ability to make significant improvements in function in a reasonable and predictable amount of time.  ?Equipment Recommendations ? BSC/3in1  ?  ?Recommendations for Other Services   ? ? ?  ?Precautions / Restrictions Precautions ?Precautions: Fall ?Restrictions ?Weight Bearing Restrictions: No  ? ?  ? ?Mobility Bed Mobility ?Overal bed mobility: Needs Assistance ?Bed Mobility: Supine to Sit ?  ?  ?Supine to sit: Min assist, HOB elevated ?  ?  ?  ?  ? ?Transfers ?Overall transfer level: Needs assistance ?Equipment used: Rolling walker (2 wheels) ?Transfers: Sit to/from Stand ?Sit to Stand: Min assist ?  ?  ?  ?  ?  ?General transfer comment: posterior lean ?  ? ?  ?Balance Overall balance assessment: Needs assistance ?Sitting-balance support: Feet supported, Bilateral upper extremity supported ?Sitting balance-Leahy Scale: Poor ?  ?  ?Standing balance support: During functional activity, Reliant on assistive device for balance, Bilateral upper extremity supported ?Standing balance-Leahy Scale: Poor ?  ?  ?  ?  ?  ?  ?  ?  ?  ?  ?  ?  ?   ? ?ADL either performed or assessed with clinical judgement  ? ?ADL Overall ADL's : Needs assistance/impaired ?  ?  ?  ?  ?  ?  ?  ?  ?  ?  ?  ?  ?  ?  ?  ?  ?  ?  ?  ?General ADL Comments: MOD A for brief mgmt in standing. MIN A RW for ADL  t/f. Requires BUE support standing, unable to progress to standing grooming tasks  ? ? ? ? ?Pertinent Vitals/Pain Pain Assessment ?Pain Assessment: No/denies pain  ? ? ? ?Hand Dominance Right ?  ?Extremity/Trunk Assessment Upper Extremity Assessment ?Upper Extremity Assessment: Generalized weakness ?  ?Lower Extremity Assessment ?Lower Extremity Assessment: Generalized weakness ?  ?Cervical / Trunk  Assessment ?Cervical / Trunk Assessment: Kyphotic ?  ?Communication Communication ?Communication: No difficulties ?  ?Cognition Arousal/Alertness: Awake/alert ?Behavior During Therapy: [facility] for tasks assessed/performed ?Overall Cognitive Status: History of cognitive impairments - at baseline ?  ?  ?  ?  ?  ?  ?  ?  ?  ?  ?  ?  ?  ?  ?  ?  ?General Comments: States name and location accurately. Follows step by step cueing ?  ?  ?General Comments  MAX HR 145 during mobility ? ?  ?   ?   ? ? ?Home Living Family/patient expects to be discharged to:: Private residence ?Living Arrangements: Children ?Available Help at Discharge: Family;Available PRN/intermittently ?Type of Home: House ?Home Access: Stairs to enter ?Entrance Stairs-Number of Steps: 2 ?  ?Home Layout: One level ?  ?  ?  ?  ?Bathroom Toilet: Standard ?  ?  ?Home Equipment: Rollator (4 wheels);Grab bars - toilet;Shower seat ?  ?Additional Comments: Daughter works nights ?  ? ?  ?Prior Functioning/Environment Prior Level of Function : Needs assist ?  ?  ?  ?  ?  ?  ?Mobility Comments: Patient ambulates in the home to the bathrom independently using Rollator or no AD. She uses a rollator when going out into the community. ?ADLs Comments: uses depends at baseline, assist for dressing/bathing ?  ? ?  ?  ?OT Problem List: Decreased strength;Decreased range of motion;Decreased activity tolerance;Impaired balance (sitting and/or standing);Decreased safety awareness ?  ?   ?OT Treatment/Interventions: Self-care/ADL training;Therapeutic exercise;Energy conservation;DME and/or AE instruction;Therapeutic activities;Patient/family education;Balance training  ?  ?OT Goals(Current goals can be found in the care plan section) Acute Rehab OT Goals ?Patient Stated Goal: to walk better ?OT Goal Formulation: With patient/family ?Time For Goal Achievement: 12/19/21 ?Potential to Achieve Goals: Good ?ADL Goals ?Pt Will Perform Grooming: with set-up;with supervision;standing ?Pt  Will Perform Lower Body Dressing: with min guard assist;sit to/from stand ?Pt Will Transfer to Toilet: with modified independence;ambulating;bedside commode  ?OT Frequency: Min 3X/week ?  ? ?Co-evaluation PT/OT/SLP Co-Evaluation/Treatment: Yes ?Reason for Co-Treatment: For patient/therapist safety;To address functional/ADL transfers ?PT goals addressed during session: Mobility/safety with mobility;Balance ?OT goals addressed during session: ADL's and self-care ?  ? ?  ?AM-PAC OT "6 Clicks" Daily Activity     ?Outcome Measure Help from another person eating meals?: None ?Help from another person taking care of personal grooming?: A Little ?Help from another person toileting, which includes using toliet, bedpan, or urinal?: A Lot ?Help from another person bathing (including washing, rinsing, drying)?: A Lot ?Help from another person to put on and taking off regular upper body clothing?: A Little ?Help from another person to put on and taking off regular lower body clothing?: A Lot ?6 Click Score: 16 ?  ?End of Session Equipment Utilized During Treatment: Rolling walker (2 wheels) ? ?Activity Tolerance: Patient tolerated treatment well ?Patient left: in chair;with call bell/phone within reach;with chair alarm set;with family/visitor present ? ?OT Visit Diagnosis: Other abnormalities of gait and mobility (R26.89);Muscle weakness (generalized) (M62.81)  ?              ?Time: 6948-5462 ?  OT Time Calculation (min): 31 min ?Charges:  OT General Charges ?$OT Visit: 1 Visit ?OT Evaluation ?$OT Eval Low Complexity: 1 Low ?OT Treatments ?$Self Care/Home Management : 23-37 mins ? ?Dessie Coma, M.S. OTR/L  ?12/05/21, 1:09 PM  ?ascom [phone] ? ?

## 2021-12-05 NOTE — Assessment & Plan Note (Deleted)
We will add on iron studies ?

## 2021-12-05 NOTE — Assessment & Plan Note (Signed)
DNR

## 2021-12-06 ENCOUNTER — Inpatient Hospital Stay: Payer: Medicare HMO

## 2021-12-06 DIAGNOSIS — R7989 Other specified abnormal findings of blood chemistry: Secondary | ICD-10-CM | POA: Diagnosis not present

## 2021-12-06 DIAGNOSIS — M2550 Pain in unspecified joint: Secondary | ICD-10-CM | POA: Diagnosis not present

## 2021-12-06 DIAGNOSIS — N179 Acute kidney failure, unspecified: Secondary | ICD-10-CM | POA: Diagnosis not present

## 2021-12-06 DIAGNOSIS — R531 Weakness: Secondary | ICD-10-CM

## 2021-12-06 DIAGNOSIS — N3001 Acute cystitis with hematuria: Secondary | ICD-10-CM | POA: Diagnosis not present

## 2021-12-06 DIAGNOSIS — D638 Anemia in other chronic diseases classified elsewhere: Secondary | ICD-10-CM

## 2021-12-06 LAB — CBC
HCT: 28.9 % — ABNORMAL LOW (ref 36.0–46.0)
Hemoglobin: 9.4 g/dL — ABNORMAL LOW (ref 12.0–15.0)
MCH: 29.5 pg (ref 26.0–34.0)
MCHC: 32.5 g/dL (ref 30.0–36.0)
MCV: 90.6 fL (ref 80.0–100.0)
Platelets: 236 10*3/uL (ref 150–400)
RBC: 3.19 MIL/uL — ABNORMAL LOW (ref 3.87–5.11)
RDW: 13.9 % (ref 11.5–15.5)
WBC: 10.3 10*3/uL (ref 4.0–10.5)
nRBC: 0 % (ref 0.0–0.2)

## 2021-12-06 LAB — COMPREHENSIVE METABOLIC PANEL
ALT: 279 U/L — ABNORMAL HIGH (ref 0–44)
AST: 206 U/L — ABNORMAL HIGH (ref 15–41)
Albumin: 2.3 g/dL — ABNORMAL LOW (ref 3.5–5.0)
Alkaline Phosphatase: 257 U/L — ABNORMAL HIGH (ref 38–126)
Anion gap: 10 (ref 5–15)
BUN: 60 mg/dL — ABNORMAL HIGH (ref 8–23)
CO2: 19 mmol/L — ABNORMAL LOW (ref 22–32)
Calcium: 8.5 mg/dL — ABNORMAL LOW (ref 8.9–10.3)
Chloride: 109 mmol/L (ref 98–111)
Creatinine, Ser: 2.76 mg/dL — ABNORMAL HIGH (ref 0.44–1.00)
GFR, Estimated: 17 mL/min — ABNORMAL LOW (ref >60)
Glucose, Bld: 148 mg/dL — ABNORMAL HIGH (ref 70–99)
Potassium: 4.2 mmol/L (ref 3.5–5.1)
Sodium: 138 mmol/L (ref 135–145)
Total Bilirubin: 0.5 mg/dL (ref 0.3–1.2)
Total Protein: 7.1 g/dL (ref 6.5–8.1)

## 2021-12-06 LAB — GLUCOSE, CAPILLARY
Glucose-Capillary: 120 mg/dL — ABNORMAL HIGH (ref 70–99)
Glucose-Capillary: 173 mg/dL — ABNORMAL HIGH (ref 70–99)
Glucose-Capillary: 248 mg/dL — ABNORMAL HIGH (ref 70–99)
Glucose-Capillary: 256 mg/dL — ABNORMAL HIGH (ref 70–99)

## 2021-12-06 LAB — URINE CULTURE: Culture: 70000 — AB

## 2021-12-06 LAB — HEPATITIS PANEL, ACUTE
HCV Ab: NONREACTIVE
Hep A IgM: NONREACTIVE
Hep B C IgM: NONREACTIVE
Hepatitis B Surface Ag: NONREACTIVE

## 2021-12-06 MED ORDER — POLYETHYLENE GLYCOL 3350 17 G PO PACK
17.0000 g | PACK | Freq: Every day | ORAL | Status: DC
  Administered 2021-12-06 – 2021-12-08 (×3): 17 g via ORAL
  Filled 2021-12-06 (×3): qty 1

## 2021-12-06 MED ORDER — SODIUM BICARBONATE 650 MG PO TABS
650.0000 mg | ORAL_TABLET | Freq: Two times a day (BID) | ORAL | Status: DC
  Administered 2021-12-06 – 2021-12-08 (×5): 650 mg via ORAL
  Filled 2021-12-06 (×5): qty 1

## 2021-12-06 NOTE — Assessment & Plan Note (Addendum)
Family would like to take home with home health ?

## 2021-12-06 NOTE — Progress Notes (Signed)
?Progress Note ? ? ?Patient: Alicia Garner FGH:[id number] DOB: 11/02/1940 DOA: 12/04/2021     2 ?DOS: the patient was seen and examined on 12/06/2021 ?  ? ? ?Assessment and Plan: ?* Acute kidney injury superimposed on CKD ([place]) ?Acute kidney injury on chronic kidney disease stage IV.  Baseline creatinine around 2.46.  Creatinine on admission 3.37.  Creatinine has come down to 2.76.  We will discontinue IV fluids ? ?Acute cystitis with hematuria ?Streptococcus anginosus growing out of urine culture.  Continue Rocephin.  Await sensitivities. ? ?Elevated liver function tests ?Continue to hold Crestor.  Ultrasound right upper quadrant shows fatty infiltration and gallbladder normal.  Hepatitis profile is negative.  AST and ALT rising at 206 and 279 respectively.  Total bilirubin 0.5 and alkaline phosphatase stable at 257.  Continue to monitor. ? ?Pain in joint, multiple sites ?Patient does have a history of arthritis but with uric acid level being 8.4 could be gout.  Patient given Solu-Medrol on admission we will continue 20 mg of prednisone daily until joint pains are better.  Colchicine dosed every 3 days secondary to kidney function. ? ?Type 2 diabetes mellitus with hyperlipidemia ([place]) ?Hold Crestor at this point.  Continue sliding scale insulin.  Continue Januvia substitute while here. ? ?Essential hypertension ?Continue Norvasc.  Hold Cozaar with acute kidney injury. ? ?DNR (do not resuscitate) ?DNR ? ?Weakness ?Physical therapy recommending rehab but family would like to take the patient home with home health ? ?Anemia of chronic disease ?Hemoglobin 9.4.  Ferritin up at 695. ? ? ? ? ?  ? ?Subjective: Patient feeling a little bit better.  Ate about a third of her breakfast this morning.  Still feeling weak.  Still having some joint pains.  Admitted with acute kidney injury on chronic kidney disease. ? ?Physical Exam: ?Vitals:  ? 12/05/21 1136 12/05/21 1923 12/06/21 0335 12/06/21 0807  ?BP: 136/78 139/78 (!) 147/91  136/81  ?Pulse: 68 86 80 (!) 109  ?Resp:  16 20   ?Temp: 98.2 ?F (36.8 ?C)  98.3 ?F (36.8 ?C) 98.4 ?F (36.9 ?C)  ?TempSrc: Oral     ?SpO2: 100% 100% 100% 100%  ?Weight:      ?Height:      ? ?Physical Exam ?HENT:  ?   Head: Normocephalic.  ?   Mouth/Throat:  ?   Pharynx: No oropharyngeal exudate.  ?Eyes:  ?   General: Lids are normal.  ?   Conjunctiva/sclera: Conjunctivae normal.  ?Cardiovascular:  ?   Rate and Rhythm: Normal rate and regular rhythm.  ?   Heart sounds: Normal heart sounds, S1 normal and S2 normal.  ?Pulmonary:  ?   Breath sounds: No decreased breath sounds, wheezing, rhonchi or rales.  ?Abdominal:  ?   Palpations: Abdomen is soft.  ?   Tenderness: There is no abdominal tenderness.  ?Musculoskeletal:  ?   Right ankle: Swelling present. Decreased range of motion.  ?   Left ankle: Swelling present. Decreased range of motion.  ?Skin: ?   General: Skin is warm.  ?   Findings: No rash.  ?Neurological:  ?   Mental Status: She is alert.  ?   Comments: Difficulty with straight leg raise.  ?  ?Data Reviewed: ?Creatinine 2.76 today, AST up at 206 ALT up at 279 hemoglobin 9.4 ? ?Family Communication: Spoke with daughter at the bedside ? ?Disposition: ?Status is: Inpatient ?Remains inpatient appropriate because: Still awaiting urine culture finalization and would like the patient to be a little  bit stronger prior to disposition.  Creatinine getting closer to baseline but not at baseline yet. ? ?Planned Discharge Destination: Home with Home Health ? ?Author: ?Loletha Grayer, MD ?12/06/2021 1:43 PM ? ?For on call review [email or web address].  ?

## 2021-12-06 NOTE — Assessment & Plan Note (Signed)
Hemoglobin 9.4.  Ferritin up at 695. ?

## 2021-12-07 ENCOUNTER — Inpatient Hospital Stay: Payer: Medicare HMO

## 2021-12-07 DIAGNOSIS — K5900 Constipation, unspecified: Secondary | ICD-10-CM

## 2021-12-07 DIAGNOSIS — N179 Acute kidney failure, unspecified: Secondary | ICD-10-CM | POA: Diagnosis not present

## 2021-12-07 DIAGNOSIS — R000 Tachycardia, unspecified: Secondary | ICD-10-CM

## 2021-12-07 DIAGNOSIS — R7989 Other specified abnormal findings of blood chemistry: Secondary | ICD-10-CM | POA: Diagnosis not present

## 2021-12-07 DIAGNOSIS — N3001 Acute cystitis with hematuria: Secondary | ICD-10-CM | POA: Diagnosis not present

## 2021-12-07 LAB — COMPREHENSIVE METABOLIC PANEL
ALT: 274 U/L — ABNORMAL HIGH (ref 0–44)
AST: 131 U/L — ABNORMAL HIGH (ref 15–41)
Albumin: 2.2 g/dL — ABNORMAL LOW (ref 3.5–5.0)
Alkaline Phosphatase: 236 U/L — ABNORMAL HIGH (ref 38–126)
Anion gap: 7 (ref 5–15)
BUN: 60 mg/dL — ABNORMAL HIGH (ref 8–23)
CO2: 22 mmol/L (ref 22–32)
Calcium: 8.5 mg/dL — ABNORMAL LOW (ref 8.9–10.3)
Chloride: 112 mmol/L — ABNORMAL HIGH (ref 98–111)
Creatinine, Ser: 2.59 mg/dL — ABNORMAL HIGH (ref 0.44–1.00)
GFR, Estimated: 18 mL/min — ABNORMAL LOW (ref >60)
Glucose, Bld: 165 mg/dL — ABNORMAL HIGH (ref 70–99)
Potassium: 4.5 mmol/L (ref 3.5–5.1)
Sodium: 141 mmol/L (ref 135–145)
Total Bilirubin: 0.5 mg/dL (ref 0.3–1.2)
Total Protein: 7 g/dL (ref 6.5–8.1)

## 2021-12-07 LAB — CBC
HCT: 28.9 % — ABNORMAL LOW (ref 36.0–46.0)
Hemoglobin: 9.4 g/dL — ABNORMAL LOW (ref 12.0–15.0)
MCH: 29.9 pg (ref 26.0–34.0)
MCHC: 32.5 g/dL (ref 30.0–36.0)
MCV: 92 fL (ref 80.0–100.0)
Platelets: 220 10*3/uL (ref 150–400)
RBC: 3.14 MIL/uL — ABNORMAL LOW (ref 3.87–5.11)
RDW: 13.8 % (ref 11.5–15.5)
WBC: 9.7 10*3/uL (ref 4.0–10.5)
nRBC: 0 % (ref 0.0–0.2)

## 2021-12-07 LAB — GLUCOSE, CAPILLARY
Glucose-Capillary: 122 mg/dL — ABNORMAL HIGH (ref 70–99)
Glucose-Capillary: 148 mg/dL — ABNORMAL HIGH (ref 70–99)
Glucose-Capillary: 224 mg/dL — ABNORMAL HIGH (ref 70–99)
Glucose-Capillary: 272 mg/dL — ABNORMAL HIGH (ref 70–99)

## 2021-12-07 LAB — D-DIMER, QUANTITATIVE: D-Dimer, Quant: 8.98 ug/mL-FEU — ABNORMAL HIGH (ref 0.00–0.50)

## 2021-12-07 LAB — HEMOGLOBIN A1C
Hgb A1c MFr Bld: 7.2 % — ABNORMAL HIGH (ref 4.8–5.6)
Mean Plasma Glucose: 160 mg/dL

## 2021-12-07 MED ORDER — METOPROLOL TARTRATE 5 MG/5ML IV SOLN
5.0000 mg | INTRAVENOUS | Status: DC | PRN
Start: 2021-12-07 — End: 2021-12-08

## 2021-12-07 MED ORDER — PREDNISONE 10 MG PO TABS
10.0000 mg | ORAL_TABLET | Freq: Every day | ORAL | Status: DC
  Administered 2021-12-08: 10 mg via ORAL
  Filled 2021-12-07: qty 1

## 2021-12-07 MED ORDER — LACTULOSE 10 GM/15ML PO SOLN
30.0000 g | Freq: Once | ORAL | Status: AC
  Administered 2021-12-07: 30 g via ORAL
  Filled 2021-12-07: qty 60

## 2021-12-07 MED ORDER — METOPROLOL SUCCINATE ER 50 MG PO TB24
50.0000 mg | ORAL_TABLET | Freq: Every day | ORAL | Status: DC
  Administered 2021-12-07 – 2021-12-08 (×2): 50 mg via ORAL
  Filled 2021-12-07 (×2): qty 1

## 2021-12-07 NOTE — Progress Notes (Addendum)
PT Cancellation Note ? ?Patient Details ?Name: Alicia Garner ?MRN: [id number] ?DOB: 10-Mar-1941 ? ? ?Cancelled Treatment:    Reason Eval/Treat Not Completed: Other (comment);Patient at procedure or test/unavailable ? ? ?Jonnie Kind, SPT ?12/07/2021, 3:07 PM ?

## 2021-12-07 NOTE — Progress Notes (Signed)
?Progress Note ? ? ?Patient: Alicia Garner BWL:[id number] DOB: Jul 29, 1941 DOA: 12/04/2021     3 ?DOS: the patient was seen and examined on 12/07/2021 ?  ? ?Assessment and Plan: ?* Acute kidney injury superimposed on CKD ([place]) ?Acute kidney injury on chronic kidney disease stage IV.  Baseline creatinine around 2.46.  Creatinine on admission 3.37.  Creatinine has come down to 2.59. ? ?Tachycardia ?Heart rate went up to 140 with physical therapy.  Came down in the 80s with rest.  Toprol XL started.  Watch overnight.  We will get an ultrasound of the lower extremities.  Send off a D-dimer. ? ?Acute cystitis with hematuria ?Streptococcus anginosus growing out of urine culture.  Continue Rocephin.   ? ?Elevated liver function tests ?Continue to hold Crestor.  Ultrasound right upper quadrant shows fatty infiltration and gallbladder normal.  Hepatitis profile is negative.  LFTs still elevated today. ? ?Pain in joint, multiple sites ?Patient does have a history of arthritis but with uric acid level being 8.4 could be gout.  Patient given Solu-Medrol on admission and given 20 mg of prednisone for the last 2 days.  Will taper down to 10 mg of prednisone.  Colchicine dosed every 3 days secondary to kidney function. ? ?Type 2 diabetes mellitus with hyperlipidemia ([place]) ?Hold Crestor at this point.  Continue sliding scale insulin.  Continue Januvia substitute while here. ? ?Essential hypertension ?Continue Norvasc.  Hold Cozaar with acute kidney injury.  Added Toprol-XL with fast heart rate with ambulation. ? ?DNR (do not resuscitate) ?DNR ? ?Constipation ?Dose of lactulose given today on MiraLAX daily already. ? ?Weakness ?Family would like to take home with home health ? ?Anemia of chronic disease ?Hemoglobin 9.4.  Ferritin up at 695. ? ? ? ? ?  ? ?Subjective: Patient feels her joint pains are little bit better.  Still with some decreased appetite.  Still with some weakness.  Has some constipation.  He had with acute kidney injury on  chronic kidney disease. ? ?Physical Exam: ?Vitals:  ? 12/06/21 1645 12/06/21 1942 12/07/21 0421 12/07/21 0815  ?BP: 132/79 (!) 139/92 (!) 156/87 (!) 157/81  ?Pulse: (!) 108 98 74 80  ?Resp:  20 20 16   ?Temp: 97.9 ?F (36.6 ?C) 98.1 ?F (36.7 ?C) (!) 97.5 ?F (36.4 ?C) 98.3 ?F (36.8 ?C)  ?TempSrc:  Oral Oral Oral  ?SpO2: 100% 100% 100% 98%  ?Weight:      ?Height:      ? ?Physical Exam ?HENT:  ?   Head: Normocephalic.  ?   Mouth/Throat:  ?   Pharynx: No oropharyngeal exudate.  ?Eyes:  ?   General: Lids are normal.  ?   Conjunctiva/sclera: Conjunctivae normal.  ?Cardiovascular:  ?   Rate and Rhythm: Normal rate and regular rhythm.  ?   Heart sounds: Normal heart sounds, S1 normal and S2 normal.  ?Pulmonary:  ?   Breath sounds: No decreased breath sounds, wheezing, rhonchi or rales.  ?Abdominal:  ?   Palpations: Abdomen is soft.  ?   Tenderness: There is no abdominal tenderness.  ?Musculoskeletal:  ?   Right ankle: Swelling present. Decreased range of motion.  ?   Left ankle: Swelling present. Decreased range of motion.  ?Skin: ?   General: Skin is warm.  ?   Findings: No rash.  ?Neurological:  ?   Mental Status: She is alert.  ?   Comments: Difficulty with straight leg raise.  ?  ?Data Reviewed: ?Creatinine 2.59 today, AST 131, ALT  274, hemoglobin 9.4 ? ?Family Communication: Spoke with daughter at the bedside ? ?Disposition: ?Status is: Inpatient ?Remains inpatient appropriate because: With faster heart rate with ambulation today we will try to control heart rate a little bit better. ? ?Planned Discharge Destination: Home with Home Health ? ?Author: ?Loletha Grayer, MD ?12/07/2021 1:54 PM ? ?For on call review [email or web address].  ?

## 2021-12-07 NOTE — TOC Progression Note (Signed)
Transition of Care (TOC) - Progression Note  ? ? ?Patient Details  ?Name: Alicia Garner ?MRN: [id number] ?Date of Birth: [birth year] ? ?Transition of Care (TOC) CM/SW Contact  ?Beverly Sessions, RN ?Phone Number: ?12/07/2021, 2:22 PM ? ?Clinical Narrative:    ? ?Confirmed with Danielle at [facility] that [facility] and RW were delivered to room  ? ?Expected Discharge Plan: [place] ?Barriers to Discharge: Continued Medical Work up ? ?Expected Discharge Plan and Services ?Expected Discharge Plan: [place] ?  ?  ?  ?Living arrangements for the past 2 months: [place] ?                ?DME Arranged: 3-N-1, Walker rolling ?DME Agency: [facility] ?Date DME Agency Contacted: 12/05/21 ?  ?Representative spoke with at DME Agency: Maudie Mercury ?HH Arranged: PT, OT, RN, Nurse's Aide ?[place] Agency: [place] ?Date HH Agency Contacted: 12/05/21 ?  ?Representative spoke with at [place]: Tommi Rumps ? ? ?Social Determinants of Health (SDOH) Interventions ?  ? ?Readmission Risk Interventions ?Readmission Risk Prevention Plan 12/05/2021  ?Transportation Screening Complete  ?PCP or Specialist Appt within 5-7 Days Complete  ?Home Care Screening Complete  ?Medication Review (RN CM) Complete  ? ? ?

## 2021-12-07 NOTE — Care Management Important Message (Signed)
Important Message ? ?Patient Details  ?Name: Alicia Garner ?MRN: [id number] ?Date of Birth: 10-28-1940 ? ? ?Medicare Important Message Given:  Yes ? ? ? ? ?Dannette Barbara ?12/07/2021, 11:09 AM ?

## 2021-12-07 NOTE — Assessment & Plan Note (Addendum)
Heart rate went up to 140 with physical therapy.  Came down in the 80s with rest.  Toprol XL started.  Watch overnight.  We will get an ultrasound of the lower extremities.  Send off a D-dimer. ?

## 2021-12-07 NOTE — Progress Notes (Addendum)
Mobility Specialist - Progress Note ? ? 12/07/21 1400  ?Mobility  ?Activity Contraindicated/medical hold  ? ? ? ?Per discussion with OT, pt's HR jumped to 140s with minimal exertion. Pt receiving [place] at this time. Will attempt session another date/time as appropriate.  ? ? ?Kathee Delton ?Mobility Specialist ?12/07/21, 2:46 PM ? ? ? ? ?

## 2021-12-07 NOTE — Progress Notes (Signed)
Mobility Specialist - Progress Note ? ? 12/07/21 1600  ?Mobility  ?Activity Ambulated with assistance in room  ?Level of Assistance Standby assist, set-up cues, supervision of patient - no hands on  ?Assistive Device Front wheel walker  ?Distance Ambulated (ft) 20 ft  ?Activity Response Tolerated well  ?$Mobility charge 1 Mobility  ? ? ?Pre-mobility: 78 HR, 96% SpO2 ?During mobility: 97 HR, 95% SpO2 ?Post-mobility: 110 HR, 96%  SpO2 ? ? ?Pt lying in bed upon arrival, utilizing RA. Min encouragement needed for OOB activity this date. Pt's daughter entered at beginning of session. Pt achieved EOB with minA, does present with fear of falling this date. Pt ambulated in room with supervision, no LOB. Slow gait with short steps. HR monitored throughout with max of 110 bpm. VC for hand placement to stand and retro-stepping to return EOB. Noted anterior lean (daughter reports pt using rollator PTA). Pt does attempt sitting before safe to do so. Fatigued post-activity. Pt returned supine with alarm set, needs in reach.  ? ? ?Kathee Delton ?Mobility Specialist ?12/07/21, 4:51 PM ? ?

## 2021-12-07 NOTE — Progress Notes (Signed)
Occupational Therapy Treatment ?Patient Details ?Name: Alicia Garner ?MRN: [id number] ?DOB: Mar 30, 1941 ?Today's Date: 12/07/2021 ? ? ?History of present illness Alicia Garner is a 81 y.o. female with medical history significant of type 2 diabetes mellitus, essential hypertension, chronic kidney disease arthritis and prior history of stroke. Patient came to hospital due to 8 days of trouble walking. Patient is poor historian. Patient admited for acute kidney injury superimposed on CKD stage IV as well as acute cystitis with hematuria and generalized weakness. ?  ?OT comments ? Pt in bed upon OT arrival, visiting with daughter at bedside.  Pt agreeable to ADL completion with OT, and completed supine to sit with HOB elevated and sequencing and hand placement cues to reach EOB.  Pt's HR jumped from low 80s to hight 140s with transition from supine to sit and scoot toward EOB.  Assisted pt to returned to supine with mod A to lift legs.  HR lowered to low 80s upon return to bed.  Elevated HOB to complete grooming activities at bed level; see below for details.  HR remained in low 80s for grooming activities at bed level.  Daughter having concerns about being able to meet pt's physical needs at home at pt's current level as daughter also has 2 young children of her own whom she cares for.  OT reinforced therapy recommendation for SNF and daughter was in agreement with this plan.  OT left pt in bed, all needs met, all needed supplies within reach, and bed alarm set.  Will continue to follow in the acute setting to work towards OT goals and poc.   ? ?Recommendations for follow up therapy are one component of a multi-disciplinary discharge planning process, led by the attending physician.  Recommendations may be updated based on patient status, additional functional criteria and insurance authorization. ?   ?Follow Up Recommendations ? Skilled nursing-short term rehab (<3 hours/day)  ?  ?Assistance Recommended at Discharge Frequent  or constant Supervision/Assistance  ?Patient can return home with the following ? A lot of help with walking and/or transfers;A lot of help with bathing/dressing/bathroom;Help with stairs or ramp for entrance ?  ?Equipment Recommendations ? BSC/3in1  ?  ?Recommendations for Other Services   ? ?  ?Precautions / Restrictions Precautions ?Precautions: Fall ?Restrictions ?Weight Bearing Restrictions: No ?Other Position/Activity Restrictions: was unable to tolerate OOB activity this day d/t HR jumped from low 80s to high 140s with transition from supine to sit.  ? ? ?  ? ?Mobility Bed Mobility ?Overal bed mobility: Needs Assistance ?Bed Mobility: Supine to Sit, Sit to Supine, Rolling ?Rolling: Mod assist ?  ?Supine to sit: Supervision, HOB elevated ?Sit to supine: Mod assist ?  ?General bed mobility comments: extra time and cues for sequencing and hand positioning to utilize bed rail during transition supine to sit; assisted mod A sit to supine d/t quick jump in HR ?Patient Response: Cooperative ? ?Transfers ?  ?  ?  ?  ?  ?  ?  ?  ?  ?General transfer comment: no OOB transfer attempts today d/t elevated HR ?  ?  ?Balance Overall balance assessment: Needs assistance ?Sitting-balance support: Feet supported, Bilateral upper extremity supported ?Sitting balance-Leahy Scale: Poor ?  ?  ?  ?  ?Standing balance comment: not attempted today d/t elevated HR ?  ?  ?  ?  ?  ?  ?  ?  ?  ?  ?  ?   ? ?ADL either performed or assessed with clinical  judgement  ? ?ADL Overall ADL's : Needs assistance/impaired ?  ?  ?Grooming: Wash/dry hands;Wash/dry face;Oral care;Bed level ?Grooming Details (indicate cue type and reason): had planned to attempt standing at sink but HR was too elevated, so completed in supine with HOB elevated ?  ?  ?  ?  ?  ?  ?  ?  ?  ?  ?  ?  ?  ?  ?  ?  ?  ? ?Extremity/Trunk Assessment Upper Extremity Assessment ?Upper Extremity Assessment: Generalized weakness ?  ?Lower Extremity Assessment ?Lower Extremity  Assessment: Generalized weakness ?  ?Cervical / Trunk Assessment ?Cervical / Trunk Assessment: Kyphotic ?  ? ?Vision Patient Visual Report: No change from baseline ?  ?  ?   ?  ?   ?  ? ?Cognition Arousal/Alertness: Awake/alert ?Behavior During Therapy: [facility] for tasks assessed/performed ?Overall Cognitive Status: History of cognitive impairments - at baseline ?  ?  ?  ?  ?  ?  ?  ?  ?  ?  ?  ?  ?  ?  ?  ?  ?General Comments: Daughter present and pt was unable to accurately verbalize who her daughter was, stating that she was her sister. ?  ?  ?   ?   ? ?  ?   ? ? ?  ?General Comments HR sitting EOB was high 140s; returned to supine and HR decreased to low 80s at rest, HOB elevated  ? ? ?Pertinent Vitals/ Pain       Pain Assessment ?Pain Assessment: No/denies pain ?Pain Score: 0-No pain ? ?Home Living   ?  ?  ?  ?  ?  ?  ?  ?  ?  ?  ?  ?  ?  ?  ?  ?  ?  ?  ? ?  ?Prior Functioning/Environment    ?  ?  ?  ?   ? ?Frequency ? Min 3X/week  ? ? ? ? ?  ?Progress Toward Goals ? ?OT Goals(current goals can now be found in the care plan section) ? Progress towards OT goals: OT to reassess next treatment ? ?Acute Rehab OT Goals ?Patient Stated Goal: To walk better ?OT Goal Formulation: With patient/family ?Time For Goal Achievement: 12/19/21 ?Potential to Achieve Goals: Good  ?Plan Discharge plan remains appropriate   ? ? ? ? ?   ?  ?  ?  ?  ? ?  ?AM-PAC OT "6 Clicks" Daily Activity     ?Outcome Measure ? ? Help from another person eating meals?: None ?Help from another person taking care of personal grooming?: A Little ?Help from another person toileting, which includes using toliet, bedpan, or urinal?: A Lot ?Help from another person bathing (including washing, rinsing, drying)?: A Lot ?Help from another person to put on and taking off regular upper body clothing?: A Little ?Help from another person to put on and taking off regular lower body clothing?: A Lot ?6 Click Score: 16 ? ?  ?End of Session   ? ?OT Visit Diagnosis:  Other abnormalities of gait and mobility (R26.89);Muscle weakness (generalized) (M62.81) ?  ?Activity Tolerance Treatment limited secondary to medical complications (Comment) (elevated HR high 140s) ?  ?Patient Left in bed;with call bell/phone within reach;with bed alarm set;with family/visitor present ?  ?Nurse Communication Other (comment) (RN present for elevated HR during bed mobility; RN stated she would message MD) ?  ? ?   ? ?Time: 1478-2956 ?OT Time Calculation (  min): 31 min ? ?Charges: OT General Charges ?$OT Visit: 1 Visit ?OT Treatments ?$Self Care/Home Management : 23-37 mins ? ?Leta Speller, MS, OTR/L ? ? ?Darleene Cleaver ?12/07/2021, 12:07 PM ?

## 2021-12-07 NOTE — Assessment & Plan Note (Signed)
Dose of lactulose given today on MiraLAX daily already. ?

## 2021-12-07 NOTE — Progress Notes (Signed)
Inpatient Diabetes Program Recommendations ? ?AACE/ADA: New Consensus Statement on Inpatient Glycemic Control (2015) ? ?Target Ranges:  Prepandial:   less than 140 mg/dL ?     Peak postprandial:   less than 180 mg/dL (1-2 hours) ?     Critically ill patients:  140 - 180 mg/dL  ? ? Latest Reference Range & Units 12/05/21 07:57 12/05/21 11:55 12/05/21 16:03 12/05/21 20:13  ?Glucose-Capillary 70 - 99 mg/dL 170 (H) ? ?1 unit Novolog ? 221 (H) ? ?2 units Novolog ? 224 (H) ? ?2 units Novolog ? 210 (H) ? ?2 units Novolog ?  ? ? Latest Reference Range & Units 12/06/21 08:06 12/06/21 11:26 12/06/21 16:43 12/06/21 21:51  ?Glucose-Capillary 70 - 99 mg/dL 120 (H) 173 (H) ? ?1 unit Novolog 248 (H) ? ?2 units Novolog 256 (H) ? ?3 units Novolog  ?(H): Data is abnormally high ? ? ? ?Home DM Meds: Januvia 25 mg daily  ? ? ?Current orders: Novolog 0-6 units TID ac/hs ?Tradjenta 5 mg daily ? ? ? ? ?MD- Note patient getting Prednisone 20 mg Daily.  Having elevated CBGs in the late afternoon likely due to the Prednisone. ? ?Please consider adding low dose Novolog Meal Coverage: ? ?Novolog 2 units TID with meals ? ?HOLD if pt eats <50% meals ? ? ? ?--Will follow patient during hospitalization-- ? ?Wyn Quaker RN, MSN, CDE ?Diabetes Coordinator ?Inpatient Glycemic Control Team ?Team Pager: [phone] (8a-5p) ? ? ? ? ? ?

## 2021-12-08 ENCOUNTER — Inpatient Hospital Stay: Payer: Medicare HMO

## 2021-12-08 ENCOUNTER — Other Ambulatory Visit: Payer: Self-pay | Admitting: Radiology

## 2021-12-08 DIAGNOSIS — N179 Acute kidney failure, unspecified: Secondary | ICD-10-CM | POA: Diagnosis not present

## 2021-12-08 DIAGNOSIS — R000 Tachycardia, unspecified: Secondary | ICD-10-CM | POA: Diagnosis not present

## 2021-12-08 DIAGNOSIS — R7989 Other specified abnormal findings of blood chemistry: Secondary | ICD-10-CM | POA: Diagnosis not present

## 2021-12-08 DIAGNOSIS — N3001 Acute cystitis with hematuria: Secondary | ICD-10-CM | POA: Diagnosis not present

## 2021-12-08 LAB — BASIC METABOLIC PANEL
Anion gap: 6 (ref 5–15)
BUN: 56 mg/dL — ABNORMAL HIGH (ref 8–23)
CO2: 23 mmol/L (ref 22–32)
Calcium: 8.6 mg/dL — ABNORMAL LOW (ref 8.9–10.3)
Chloride: 110 mmol/L (ref 98–111)
Creatinine, Ser: 2.29 mg/dL — ABNORMAL HIGH (ref 0.44–1.00)
GFR, Estimated: 21 mL/min — ABNORMAL LOW (ref >60)
Glucose, Bld: 122 mg/dL — ABNORMAL HIGH (ref 70–99)
Potassium: 4.4 mmol/L (ref 3.5–5.1)
Sodium: 139 mmol/L (ref 135–145)

## 2021-12-08 LAB — GLUCOSE, CAPILLARY
Glucose-Capillary: 120 mg/dL — ABNORMAL HIGH (ref 70–99)
Glucose-Capillary: 132 mg/dL — ABNORMAL HIGH (ref 70–99)

## 2021-12-08 MED ORDER — CEPHALEXIN 500 MG PO CAPS
500.0000 mg | ORAL_CAPSULE | Freq: Two times a day (BID) | ORAL | 0 refills | Status: AC
Start: 2021-12-09 — End: 2021-12-11

## 2021-12-08 MED ORDER — PREDNISONE 5 MG PO TABS: ORAL_TABLET | ORAL | 0 refills | Status: DC

## 2021-12-08 MED ORDER — COLCHICINE 0.6 MG PO TABS: 0.3000 mg | ORAL_TABLET | ORAL | 0 refills | Status: DC

## 2021-12-08 MED ORDER — POLYETHYLENE GLYCOL 3350 17 G PO PACK: 17.0000 g | PACK | Freq: Every day | ORAL | 0 refills | Status: AC | PRN

## 2021-12-08 MED ORDER — METOPROLOL SUCCINATE ER 25 MG PO TB24: 25.0000 mg | ORAL_TABLET | Freq: Every day | ORAL | 0 refills | Status: DC

## 2021-12-08 MED ORDER — TECHNETIUM TO 99M ALBUMIN AGGREGATED
4.1600 | Freq: Once | INTRAVENOUS | Status: AC | PRN
  Administered 2021-12-08: 4.16 via INTRAVENOUS

## 2021-12-08 MED ORDER — SODIUM BICARBONATE 650 MG PO TABS: 650.0000 mg | ORAL_TABLET | Freq: Two times a day (BID) | ORAL | 0 refills | Status: DC

## 2021-12-08 NOTE — TOC Transition Note (Signed)
Transition of Care (TOC) - CM/SW Discharge Note ? ? ?Patient Details  ?Name: Alicia Garner ?MRN: [id number] ?Date of Birth: October 22, 1940 ? ?Transition of Care (TOC) CM/SW Contact:  ?Beverly Sessions, RN ?Phone Number: ?12/08/2021, 2:11 PM ? ? ?Clinical Narrative:    ? ?Patient to discharge home today ?Daughter to transport at discharge ?Cory with [facility] notified of discharge ? ?  ?Barriers to Discharge: Continued Medical Work up ? ? ?Patient Goals and CMS Choice ?Patient states their goals for this hospitalization and ongoing recovery are:: home with home health ?CMS Medicare.gov Compare Post Acute Care list provided to:: Patient Represenative (must comment) ?Choice offered to / list presented to : Adult Children ? ?Discharge Placement ?  ?           ?  ?  ?  ?  ? ?Discharge Plan and Services ?  ?  ?           ?DME Arranged: 3-N-1, Walker rolling ?DME Agency: [employer] ?Date DME Agency Contacted: 12/05/21 ?  ?Representative spoke with at DME Agency: Maudie Mercury ?HH Arranged: PT, OT, RN, Nurse's Aide ?[place] Agency: [place] ?Date HH Agency Contacted: 12/05/21 ?  ?Representative spoke with at [place]: Tommi Rumps ? ?Social Determinants of Health (SDOH) Interventions ?  ? ? ?Readmission Risk Interventions ?Readmission Risk Prevention Plan 12/05/2021  ?Transportation Screening Complete  ?PCP or Specialist Appt within 5-7 Days Complete  ?Home Care Screening Complete  ?Medication Review (RN CM) Complete  ? ? ? ? ? ?

## 2021-12-08 NOTE — Progress Notes (Addendum)
Occupational Therapy Treatment ?Patient Details ?Name: Alicia Garner ?MRN: [id number] ?DOB: 18-Feb-1941 ?Today's Date: 12/08/2021 ? ? ?History of present illness Alicia Garner is a 81 y.o. female with medical history significant of type 2 diabetes mellitus, essential hypertension, chronic kidney disease arthritis and prior history of stroke. Patient came to hospital due to 8 days of trouble walking. Patient is poor historian. Patient admited for acute kidney injury superimposed on CKD stage IV as well as acute cystitis with hematuria and generalized weakness. ?  ?OT comments ? Ms Ra was seen for OT treatment on this date. Upon arrival to room pt reclined in bed, family at bedsdie, pt agreeable to tx. Pt requires MIN A + RW sit<>stand - multiple attempts without assist and unable to achieve lift off ultimately requiring MIN A. MIN A + RW for ~30 ft functional mobility - repeated cues for RW mgmt ultimately requiring MIN A to manage RW for safety. Max HE 91 bpm with mobility. SETUP + SUPERVISION seated grooming tasks. Family in room demonstrates good awareness of proper technique for safe transfer and RW use. Pt making good progress toward goals. Will continue to follow POC. Discharge recommendation remains appropriate, pt may d/c home safely with assist for all OOB mobility.  ?  ? ?Recommendations for follow up therapy are one component of a multi-disciplinary discharge planning process, led by the attending physician.  Recommendations may be updated based on patient status, additional functional criteria and insurance authorization. ?   ?Follow Up Recommendations ? Skilled nursing-short term rehab (<3 hours/day)  ?  ?Assistance Recommended at Discharge Frequent or constant Supervision/Assistance  ?Patient can return home with the following ? A lot of help with walking and/or transfers;A lot of help with bathing/dressing/bathroom;Help with stairs or ramp for entrance ?  ?Equipment Recommendations ? BSC/3in1  ?   ?Recommendations for Other Services   ? ?  ?Precautions / Restrictions Precautions ?Precautions: Fall ?Restrictions ?Weight Bearing Restrictions: No  ? ? ?  ? ?Mobility Bed Mobility ?Overal bed mobility: Needs Assistance ?Bed Mobility: Supine to Sit, Sit to Supine ?  ?  ?Supine to sit: Supervision ?Sit to supine: Supervision ?  ?  ?  ? ?Transfers ?Overall transfer level: Needs assistance ?Equipment used: Rolling walker (2 wheels) ?Transfers: Sit to/from Stand ?Sit to Stand: Min assist ?  ?  ?  ?  ?  ?General transfer comment: multiple attempts without assist and unable to achieve lift off ultimately requiring MIN A. ?  ?  ?Balance Overall balance assessment: Needs assistance ?Sitting-balance support: No upper extremity supported, Feet supported ?Sitting balance-Leahy Scale: Fair ?  ?  ?Standing balance support: During functional activity, Reliant on assistive device for balance, Bilateral upper extremity supported ?Standing balance-Leahy Scale: Fair ?Standing balance comment: constant cueing for safe RW use ?  ?  ?  ?  ?  ?  ?  ?  ?  ?  ?  ?   ? ?ADL either performed or assessed with clinical judgement  ? ?ADL Overall ADL's : Needs assistance/impaired ?  ?  ?  ?  ?  ?  ?  ?  ?  ?  ?  ?  ?  ?  ?  ?  ?  ?  ?  ?General ADL Comments: MIN A + RW for ADL t/f - repeated cues for RW mgmt. SETUP + SUPERVISION seated grooming tasks ?  ? ? ? ?Cognition Arousal/Alertness: Awake/alert ?Behavior During Therapy: [facility] for tasks assessed/performed ?Overall Cognitive Status: History of cognitive  impairments - at baseline ?  ?  ?  ?  ?  ?  ?  ?  ?  ?  ?  ?  ?  ?  ?  ?  ?General Comments: requires constant cues for safe RW use and sequencing transfers ?  ?  ?   ?Pertinent Vitals/ Pain       Pain Assessment ?Pain Assessment: No/denies pain ? ? ?Frequency ? Min 3X/week  ? ? ? ? ?  ?Progress Toward Goals ? ?OT Goals(current goals can now be found in the care plan section) ? Progress towards OT goals: Progressing toward goals ? ?Acute  Rehab OT Goals ?Patient Stated Goal: to go home ?OT Goal Formulation: With patient/family ?Time For Goal Achievement: 12/19/21 ?Potential to Achieve Goals: Good ?ADL Goals ?Pt Will Perform Grooming: with set-up;with supervision;standing ?Pt Will Perform Lower Body Dressing: with min guard assist;sit to/from stand ?Pt Will Transfer to Toilet: with modified independence;ambulating;bedside commode  ?Plan Discharge plan remains appropriate;Frequency remains appropriate   ? ?Co-evaluation ? ? ?   ?  ?  ?  ?  ? ?  ?AM-PAC OT "6 Clicks" Daily Activity     ?Outcome Measure ? ? Help from another person eating meals?: None ?Help from another person taking care of personal grooming?: A Little ?Help from another person toileting, which includes using toliet, bedpan, or urinal?: A Little ?Help from another person bathing (including washing, rinsing, drying)?: A Lot ?Help from another person to put on and taking off regular upper body clothing?: A Little ?Help from another person to put on and taking off regular lower body clothing?: A Lot ?6 Click Score: 17 ? ?  ?End of Session Equipment Utilized During Treatment: Rolling walker (2 wheels) ? ?OT Visit Diagnosis: Other abnormalities of gait and mobility (R26.89);Muscle weakness (generalized) (M62.81) ?  ?Activity Tolerance Patient tolerated treatment well ?  ?Patient Left in bed;with call bell/phone within reach;with nursing/sitter in room;with family/visitor present ?  ?Nurse Communication   ?  ? ?   ? ?Time: 6222-9798 ?OT Time Calculation (min): 12 min ? ?Charges: OT General Charges ?$OT Visit: 1 Visit ?OT Treatments ?$Self Care/Home Management : 8-22 mins ? ?Dessie Coma, M.S. OTR/L  ?12/08/21, 1:42 PM  ?ascom [phone] ? ?

## 2021-12-08 NOTE — Progress Notes (Signed)
Physical Therapy Treatment ?Patient Details ?Name: Alicia Garner ?MRN: [id number] ?DOB: [birth year] ?Today's Date: 12/08/2021 ? ? ?History of Present Illness Miral Hoopes is a 81 y.o. female with medical history significant of type 2 diabetes mellitus, essential hypertension, chronic kidney disease arthritis and prior history of stroke. Patient came to hospital due to 8 days of trouble walking. Patient is poor historian. Patient admited for acute kidney injury superimposed on CKD stage IV as well as acute cystitis with hematuria and generalized weakness. ? ?  ?PT Comments  ? ? Pt was resting in bed w/ family at bedside. She is willing to work w/ PT today, but when asked who is in the room w/ [place] states that her daughters are "they're my sisters". Pt is able to perform bed mobility w/ SUPERVISION to sit EOB. Once seated EOB she required minA using RW to progress to standing and was able to ambulate ~58ft w/ CGA using RW. Noticeable fatigue due to knee buckling once Pt standing at bedside prior to transferring back to supine. minA was provided for LE transfer back into bed due to weakness and increase fatigue following session. Pt will benefit from continued skilled PT to increase LE strength/endurance, improve mobility/gait, and restore PLOF. Current discharge recommendation remains appropriate due to the level of assistance required by the patient to ensure safety and improve overall function. ?  ?Recommendations for follow up therapy are one component of a multi-disciplinary discharge planning process, led by the attending physician.  Recommendations may be updated based on patient status, additional functional criteria and insurance authorization. ? ?Follow Up Recommendations ? Skilled nursing-short term rehab (<3 hours/day) ?  ?  ?Assistance Recommended at Discharge Frequent or constant Supervision/Assistance  ?Patient can return home with the following Assist for transportation;Assistance with cooking/housework;A little  help with walking and/or transfers;A little help with bathing/dressing/bathroom ?  ?Equipment Recommendations ? BSC/3in1;Rolling walker (2 wheels)  ?  ?Recommendations for Other Services   ? ? ?  ?Precautions / Restrictions Precautions ?Precautions: Fall ?Restrictions ?Weight Bearing Restrictions: No  ?  ? ?Mobility ? Bed Mobility ?Overal bed mobility: Needs Assistance ?Bed Mobility: Supine to Sit, Sit to Supine ?  ?  ?Supine to sit: Supervision, HOB elevated ?Sit to supine: Min assist ?  ?General bed mobility comments: minA for LE transfer back into bed ?  ? ?Transfers ?Overall transfer level: Needs assistance ?Equipment used: Rolling walker (2 wheels) ?Transfers: Sit to/from Stand ?Sit to Stand: Min assist ?  ?  ?  ?  ?  ?  ?  ? ?Ambulation/Gait ?Ambulation/Gait assistance: Min guard ?Gait Distance (Feet): 45 Feet ?Assistive device: Rolling walker (2 wheels) ?Gait Pattern/deviations: Decreased stride length, Decreased step length - right, Step-through pattern, Step-to pattern ?Gait velocity: decreased ?  ?  ?General Gait Details: requiring verbal cues to stay w/in RW BOS and "standing tall" ? ? ?Stairs ?  ?  ?  ?  ?  ? ? ?Wheelchair Mobility ?  ? ?Modified Rankin (Stroke Patients Only) ?  ? ? ?  ?Balance Overall balance assessment: Needs assistance ?Sitting-balance support: Feet supported, Bilateral upper extremity supported ?Sitting balance-Leahy Scale: Fair ?  ?  ?Standing balance support: During functional activity, Reliant on assistive device for balance, Bilateral upper extremity supported ?Standing balance-Leahy Scale: Poor ?  ?  ?  ?  ?  ?  ?  ?  ?  ?  ?  ?  ?  ? ?  ?Cognition Arousal/Alertness: Awake/alert ?Behavior During Therapy: [facility] for tasks assessed/performed ?  Overall Cognitive Status: History of cognitive impairments - at baseline ?  ?  ?  ?  ?  ?  ?  ?  ?  ?  ?  ?  ?  ?  ?  ?  ?General Comments: Both daughters present and pt was unable to accurately verbalize who her daughters were, stating that  they were her sisters ?  ?  ? ?  ?Exercises   ? ?  ?General Comments   ?  ?  ? ?Pertinent Vitals/Pain Pain Assessment ?Pain Assessment: No/denies pain  ? ? ?Home Living   ?  ?  ?  ?  ?  ?  ?  ?  ?  ?   ?  ?Prior Function    ?  ?  ?   ? ?PT Goals (current goals can now be found in the care plan section) Progress towards PT goals: Progressing toward goals ? ?  ?Frequency ? ? ? Min 2X/week ? ? ? ?  ?PT Plan Current plan remains appropriate  ? ? ?Co-evaluation   ?  ?  ?  ?  ? ?  ?AM-PAC PT "6 Clicks" Mobility   ?Outcome Measure ? Help needed turning from your back to your side while in a flat bed without using bedrails?: A Little ?Help needed moving from lying on your back to sitting on the side of a flat bed without using bedrails?: A Little ?Help needed moving to and from a bed to a chair (including a wheelchair)?: A Little ?Help needed standing up from a chair using your arms (e.g., wheelchair or bedside chair)?: A Little ?Help needed to walk in hospital room?: A Lot ?Help needed climbing 3-5 steps with a railing? : A Lot ?6 Click Score: 16 ? ?  ?End of Session Equipment Utilized During Treatment: Gait belt ?Activity Tolerance: Patient tolerated treatment well ?Patient left: in bed;with call bell/phone within reach;with bed alarm set;with family/visitor present ?Nurse Communication: Mobility status ?PT Visit Diagnosis: Difficulty in walking, not elsewhere classified (R26.2);Unsteadiness on feet (R26.81);Muscle weakness (generalized) (M62.81);Other abnormalities of gait and mobility (R26.89) ?  ? ? ?Time: 7408-1448 ?PT Time Calculation (min) (ACUTE ONLY): 17 min ? ?Charges:             ?          ? ?Jonnie Kind, SPT ?12/08/2021, 12:42 PM ? ?

## 2021-12-08 NOTE — Discharge Summary (Signed)
?Physician Discharge Summary ?  ?Patient: Alicia Garner MRN: [id number] DOB: 19-Jun-1941  ?Admit date:     12/04/2021  ?Discharge date: 12/08/21  ?Discharge Physician: Loletha Grayer  ? ?PCP: [facility]  ? ?Recommendations at discharge:  ? ?Follow-up PCP 5 days ?Follow-up your kidney doctor ? ?Discharge Diagnoses: ?Principal Problem: ?  Acute kidney injury superimposed on CKD ([place]) ?Active Problems: ?  Acute cystitis with hematuria ?  Tachycardia ?  Elevated liver function tests ?  Pain in joint, multiple sites ?  Type 2 diabetes mellitus with hyperlipidemia ([place]) ?  Essential hypertension ?  DNR (do not resuscitate) ?  Anemia of chronic disease ?  Weakness ?  Constipation ? ? ? ?Hospital Course: ?The patient was admitted to the hospital on 12/04/2021.  She was discharged on 12/08/2021.  Came in with weakness and not walking for the past 8 days prior to coming in.  She was having swelling and pain in her joints and decreased appetite and not eating or drinking very well.  Her creatinine was more elevated than usual at 3.37.  Patient was given IV fluid hydration.  Patient was given 1 dose of Solu-Medrol and started on prednisone.  Her joint pain has improved and she was able to ambulate with physical therapy.  She did have Streptococcus anginosus growing out of the urine culture.  She was given Rocephin while here and will give a few more days of Keflex upon going home.  With her elevated liver function test her Crestor was held.  Ultrasound of the right upper quadrant shows fatty infiltration of the liver.  Hepatitis profiles are negative. ? ?Assessment and Plan: ?* Acute kidney injury superimposed on CKD ([place]) ?Acute kidney injury on chronic kidney disease stage IV.  Baseline creatinine around 2.46.  Creatinine on admission 3.37.  Creatinine has come down to 2.29.  She was also started on sodium bicarb. ? ?Tachycardia ?Heart rate went up to 140 with physical therapy yesterday.  I started Toprol-XL.  Today  with ambulation heart rate only went up to 110.  D-dimer was high, so I ordered ultrasound and VQ scan.  Ultrasound the lower extremity negative.  VQ scan negative. ? ?Acute cystitis with hematuria ?Streptococcus anginosus growing out of urine culture.  Rocephin given in the hospital.  Switch over to Keflex upon discharge. ? ?Elevated liver function tests ?Continue to hold Crestor.  Ultrasound right upper quadrant shows fatty infiltration and gallbladder normal.  Hepatitis profile is negative.  LFTs still elevated with AST 131 and ALT 274.  Continue to hold Crestor upon disposition.  Recommend rechecking liver function test as outpatient. ? ?Pain in joint, multiple sites ?Patient does have a history of arthritis but with uric acid level being 8.4 could be gout.  Patient given Solu-Medrol on admission and a prednisone taper given upon disposition colchicine dosed every 3 days secondary to kidney function. ? ?Type 2 diabetes mellitus with hyperlipidemia ([place]) ?Hold Crestor at this point.  Continue sliding scale insulin.  Continue Januvia substitute while here. ? ?Essential hypertension ?Continue Norvasc and Toprol.  Can go back on losartan as outpatient ? ?DNR (do not resuscitate) ?DNR ? ?Constipation ?As needed MiraLAX ? ?Weakness ?Family would like to take home with home health ? ?Anemia of chronic disease ?Hemoglobin 9.4.  Ferritin up at 695. ? ? ? ? ?  ? ? ?Consultants: None ?Procedures performed: None ?Disposition: Home health ?Diet recommendation:  ?Cardiac and Carb modified diet ?DISCHARGE MEDICATION: ?Allergies as of 12/08/2021   ? ?  Reactions  ? Lisinopril Swelling  ? ?  ? ?  ?Medication List  ?  ? ?STOP taking these medications   ? ?rosuvastatin 20 MG tablet ?Commonly known as: CRESTOR ?  ? ?  ? ?TAKE these medications   ? ?amLODipine 10 MG tablet ?Commonly known as: NORVASC ?Take 10 mg by mouth daily. ?  ?cephALEXin 500 MG capsule ?Commonly known as: KEFLEX ?Take 1 capsule (500 mg total) by mouth 2 (two)  times daily for 2 days. ?Start taking on: December 09, 2021 ?  ?Cholecalciferol 25 MCG (1000 UT) capsule ?Take by mouth. ?  ?colchicine 0.6 MG tablet ?Take 0.5 tablets (0.3 mg total) by mouth every 3 (three) days. ?Start taking on: December 10, 2021 ?  ?losartan 50 MG tablet ?Commonly known as: COZAAR ?Take 50 mg by mouth daily. ?  ?metoprolol succinate 25 MG 24 hr tablet ?Commonly known as: TOPROL-XL ?Take 1 tablet (25 mg total) by mouth daily. Take with or immediately following a meal. ?Start taking on: December 09, 2021 ?  ?polyethylene glycol 17 g packet ?Commonly known as: MIRALAX / GLYCOLAX ?Take 17 g by mouth daily as needed for moderate constipation. ?  ?predniSONE 5 MG tablet ?Commonly known as: DELTASONE ?2 tabs po daily for two days, then 1 tab po daily for two days then 0.5 tabs po daily for two days ?  ?sitaGLIPtin 25 MG tablet ?Commonly known as: JANUVIA ?Take 1 tablet (25 mg total) by mouth once daily For diabetes ?  ?sodium bicarbonate 650 MG tablet ?Take 1 tablet (650 mg total) by mouth 2 (two) times daily. ?  ? ?  ? ?  ?  ? ? ?  ?Durable Medical Equipment  ?(From admission, onward)  ?  ? ? ?  ? ?  Start     Ordered  ? 12/05/21 1527  For home use only DME 3 n 1  Once       ? 12/05/21 1527  ? 12/05/21 1527  For home use only DME Walker rolling  Once       ?Question Answer Comment  ?Walker: With 5 Inch Wheels   ?Patient needs a walker to treat with the following condition Unsteady gait when walking   ?  ? 12/05/21 1527  ? ?  ?  ? ?  ? ? ?Discharge Exam: ?Filed Weights  ? 12/04/21 1108  ?Weight: 72.6 kg  ? ?Physical Exam ?HENT:  ?   Head: Normocephalic.  ?   Mouth/Throat:  ?   Pharynx: No oropharyngeal exudate.  ?Eyes:  ?   General: Lids are normal.  ?   Conjunctiva/sclera: Conjunctivae normal.  ?Cardiovascular:  ?   Rate and Rhythm: Normal rate and regular rhythm.  ?   Heart sounds: Normal heart sounds, S1 normal and S2 normal.  ?Pulmonary:  ?   Breath sounds: No decreased breath sounds, wheezing, rhonchi or  rales.  ?Abdominal:  ?   Palpations: Abdomen is soft.  ?   Tenderness: There is no abdominal tenderness.  ?Musculoskeletal:  ?   Right ankle: Swelling present.  ?   Left ankle: Swelling present.  ?Skin: ?   General: Skin is warm.  ?   Findings: No rash.  ?Neurological:  ?   Mental Status: She is alert.  ?   Comments: Difficulty with straight leg raise.  ?  ? ?Condition at discharge: stable ? ?The results of significant diagnostics from this hospitalization (including imaging, microbiology, ancillary and laboratory) are listed below for reference.  ? ?  Imaging Studies: ?DG Chest 2 View ? ?Result Date: 12/08/2021 ?CLINICAL DATA:  Shortness of breath EXAM: CHEST - 2 VIEW COMPARISON:  12/04/2021 FINDINGS: There is poor inspiration. Transverse diameter of heart is increased. There are no signs of pulmonary edema or focal pulmonary consolidation. There is no significant pleural effusion or pneumothorax. IMPRESSION: No active cardiopulmonary disease. Electronically Signed   By: Elmer Picker M.D.   On: 12/08/2021 08:16  ? ?NM Pulmonary Perfusion ? ?Result Date: 12/08/2021 ?CLINICAL DATA:  Elevated D-dimer.  Concern for pulmonary embolism. EXAM: NUCLEAR MEDICINE PERFUSION LUNG SCAN TECHNIQUE: Perfusion images were obtained in multiple projections after intravenous injection of radiopharmaceutical. RADIOPHARMACEUTICALS:  4.2 mCi Tc-3m MAA COMPARISON:  Chest radiograph 12/08/2021 FINDINGS: No wedge-shaped peripheral perfusion defects to suggest acute pulmonary embolism. Normal perfusion pattern. IMPRESSION: No evidence acute pulmonary embolism. Electronically Signed   By: Suzy Bouchard M.D.   On: 12/08/2021 11:44  ? ?US Venous Img Lower Bilateral (DVT) ? ?Result Date: 12/07/2021 ?CLINICAL DATA:  Lower extremity swelling EXAM: BILATERAL LOWER EXTREMITY VENOUS DOPPLER ULTRASOUND TECHNIQUE: Gray-scale sonography with graded compression, as well as color Doppler and duplex ultrasound were performed to evaluate the lower  extremity deep venous systems from the level of the common femoral vein and including the common femoral, femoral, profunda femoral, popliteal and calf veins including the posterior tibial, peroneal and gastr

## 2021-12-08 NOTE — Progress Notes (Signed)
Pt discharged per MD order. IV removed. Discharge instructions reviewed with pt. Pt verbalized understanding. All questions answered to pt satisfaction. Pt taken to care in wheelchair with all DME via staff.  ?

## 2021-12-22 DIAGNOSIS — R7989 Other specified abnormal findings of blood chemistry: Secondary | ICD-10-CM | POA: Diagnosis not present

## 2021-12-22 DIAGNOSIS — E119 Type 2 diabetes mellitus without complications: Secondary | ICD-10-CM | POA: Diagnosis not present

## 2021-12-22 DIAGNOSIS — R748 Abnormal levels of other serum enzymes: Secondary | ICD-10-CM | POA: Diagnosis not present

## 2021-12-22 DIAGNOSIS — E1122 Type 2 diabetes mellitus with diabetic chronic kidney disease: Secondary | ICD-10-CM | POA: Diagnosis not present

## 2021-12-22 DIAGNOSIS — R569 Unspecified convulsions: Secondary | ICD-10-CM | POA: Diagnosis not present

## 2021-12-22 DIAGNOSIS — N183 Chronic kidney disease, stage 3 unspecified: Secondary | ICD-10-CM | POA: Diagnosis not present

## 2022-02-03 DIAGNOSIS — F0390 Unspecified dementia without behavioral disturbance: Secondary | ICD-10-CM | POA: Diagnosis not present

## 2022-02-03 DIAGNOSIS — I680 Cerebral amyloid angiopathy: Secondary | ICD-10-CM | POA: Diagnosis not present

## 2022-02-03 DIAGNOSIS — M109 Gout, unspecified: Secondary | ICD-10-CM | POA: Diagnosis not present

## 2022-02-03 DIAGNOSIS — M25522 Pain in left elbow: Secondary | ICD-10-CM | POA: Diagnosis not present

## 2022-02-03 DIAGNOSIS — E1122 Type 2 diabetes mellitus with diabetic chronic kidney disease: Secondary | ICD-10-CM | POA: Diagnosis not present

## 2022-02-03 DIAGNOSIS — S91301A Unspecified open wound, right foot, initial encounter: Secondary | ICD-10-CM | POA: Diagnosis not present

## 2022-02-03 DIAGNOSIS — N189 Chronic kidney disease, unspecified: Secondary | ICD-10-CM | POA: Diagnosis not present

## 2022-02-18 DIAGNOSIS — E11621 Type 2 diabetes mellitus with foot ulcer: Secondary | ICD-10-CM | POA: Diagnosis not present

## 2022-02-18 DIAGNOSIS — L97411 Non-pressure chronic ulcer of right heel and midfoot limited to breakdown of skin: Secondary | ICD-10-CM | POA: Diagnosis not present

## 2022-02-18 DIAGNOSIS — Z87891 Personal history of nicotine dependence: Secondary | ICD-10-CM | POA: Diagnosis not present

## 2022-03-18 DIAGNOSIS — Z09 Encounter for follow-up examination after completed treatment for conditions other than malignant neoplasm: Secondary | ICD-10-CM | POA: Diagnosis not present

## 2022-03-18 DIAGNOSIS — Z872 Personal history of diseases of the skin and subcutaneous tissue: Secondary | ICD-10-CM | POA: Diagnosis not present

## 2022-04-16 ENCOUNTER — Other Ambulatory Visit: Payer: Self-pay

## 2022-04-16 NOTE — Patient Outreach (Signed)
[facility]) Care Management  04/16/2022  Alicia Garner Mar 28, 1941 [id number]   Telephone Screen    Outreach call to patient to introduce [facility] services and assess care needs as part of benefit of PCP office and insurance plan. No answer. RN CM left HIPAA compliant voicemail message along with contact info.    Plan: RN CM will make outreach attempt to patient within 4 business days. RN CM will send unsuccessful outreach letter to patient.   Enzo Montgomery, RN,BSN,CCM [facility] Telephonic Care Management Coordinator Direct Phone: [phone] Toll Free: [phone] Fax: [phone]

## 2022-04-20 ENCOUNTER — Other Ambulatory Visit: Payer: Self-pay

## 2022-04-20 NOTE — Patient Outreach (Signed)
[facility]) Care Management  04/20/2022  Memory Alicia Garner Jan 05, 1941 [id number]   Telephone Screen       Outreach call to patient to introduce [facility] services and assess care needs as part of benefit of PCP office and insurance plan. Spoke with caregiver/daughter-[name]. She reports tat patient is doing well and has made signs of improvement. She confirms that there are no issues regarding meds. Patient has [employer] and Medicaid. No issues with transportation-family takes patient to appt. Discussed that patient is eligible and should be assigned case worker through [employer] program and instructed caregiver on how to reach out to them for any needs/concerns. She voiced understanding.       Plan: RN CM will close case.   Enzo Montgomery, RN,BSN,CCM [place] [facility] Telephonic Care Management Coordinator Direct Phone: [phone] Toll Free: [phone] Fax: [phone]

## 2022-10-11 ENCOUNTER — Observation Stay: Payer: Medicare HMO

## 2022-10-11 ENCOUNTER — Inpatient Hospital Stay
Admission: EM | Admit: 2022-10-11 | Discharge: 2022-10-13 | DRG: 065 | Disposition: A | Discharge status: Home Health | Payer: Medicare HMO | Attending: Internal Medicine | Admitting: Internal Medicine

## 2022-10-11 ENCOUNTER — Emergency Department: Payer: Medicare HMO

## 2022-10-11 ENCOUNTER — Other Ambulatory Visit: Payer: Self-pay

## 2022-10-11 ENCOUNTER — Encounter: Payer: Self-pay | Admitting: Internal Medicine

## 2022-10-11 DIAGNOSIS — Z888 Allergy status to other drugs, medicaments and biological substances status: Secondary | ICD-10-CM | POA: Diagnosis not present

## 2022-10-11 DIAGNOSIS — I10 Essential (primary) hypertension: Secondary | ICD-10-CM | POA: Diagnosis present

## 2022-10-11 DIAGNOSIS — I6300 Cerebral infarction due to thrombosis of unspecified precerebral artery: Secondary | ICD-10-CM | POA: Diagnosis not present

## 2022-10-11 DIAGNOSIS — I6389 Other cerebral infarction: Secondary | ICD-10-CM | POA: Diagnosis not present

## 2022-10-11 DIAGNOSIS — F03A0 Unspecified dementia, mild, without behavioral disturbance, psychotic disturbance, mood disturbance, and anxiety: Secondary | ICD-10-CM | POA: Diagnosis not present

## 2022-10-11 DIAGNOSIS — R0902 Hypoxemia: Secondary | ICD-10-CM | POA: Diagnosis not present

## 2022-10-11 DIAGNOSIS — E1169 Type 2 diabetes mellitus with other specified complication: Secondary | ICD-10-CM | POA: Diagnosis not present

## 2022-10-11 DIAGNOSIS — E1122 Type 2 diabetes mellitus with diabetic chronic kidney disease: Secondary | ICD-10-CM | POA: Diagnosis not present

## 2022-10-11 DIAGNOSIS — Z79899 Other long term (current) drug therapy: Secondary | ICD-10-CM

## 2022-10-11 DIAGNOSIS — I6381 Other cerebral infarction due to occlusion or stenosis of small artery: Principal | ICD-10-CM | POA: Diagnosis present

## 2022-10-11 DIAGNOSIS — Z7982 Long term (current) use of aspirin: Secondary | ICD-10-CM | POA: Diagnosis not present

## 2022-10-11 DIAGNOSIS — Z8249 Family history of ischemic heart disease and other diseases of the circulatory system: Secondary | ICD-10-CM | POA: Diagnosis not present

## 2022-10-11 DIAGNOSIS — I129 Hypertensive chronic kidney disease with stage 1 through stage 4 chronic kidney disease, or unspecified chronic kidney disease: Secondary | ICD-10-CM | POA: Diagnosis present

## 2022-10-11 DIAGNOSIS — I6339 Cerebral infarction due to thrombosis of other cerebral artery: Secondary | ICD-10-CM | POA: Diagnosis not present

## 2022-10-11 DIAGNOSIS — I639 Cerebral infarction, unspecified: Secondary | ICD-10-CM | POA: Diagnosis not present

## 2022-10-11 DIAGNOSIS — I771 Stricture of artery: Secondary | ICD-10-CM | POA: Diagnosis not present

## 2022-10-11 DIAGNOSIS — N184 Chronic kidney disease, stage 4 (severe): Secondary | ICD-10-CM | POA: Diagnosis present

## 2022-10-11 DIAGNOSIS — I7789 Other specified disorders of arteries and arterioles: Secondary | ICD-10-CM | POA: Diagnosis not present

## 2022-10-11 DIAGNOSIS — D631 Anemia in chronic kidney disease: Secondary | ICD-10-CM | POA: Diagnosis present

## 2022-10-11 DIAGNOSIS — R29710 NIHSS score 10: Secondary | ICD-10-CM | POA: Diagnosis present

## 2022-10-11 DIAGNOSIS — R29818 Other symptoms and signs involving the nervous system: Secondary | ICD-10-CM | POA: Diagnosis not present

## 2022-10-11 DIAGNOSIS — G8194 Hemiplegia, unspecified affecting left nondominant side: Secondary | ICD-10-CM | POA: Diagnosis present

## 2022-10-11 DIAGNOSIS — I6523 Occlusion and stenosis of bilateral carotid arteries: Secondary | ICD-10-CM | POA: Diagnosis not present

## 2022-10-11 DIAGNOSIS — Z833 Family history of diabetes mellitus: Secondary | ICD-10-CM | POA: Diagnosis not present

## 2022-10-11 DIAGNOSIS — G319 Degenerative disease of nervous system, unspecified: Secondary | ICD-10-CM | POA: Diagnosis not present

## 2022-10-11 DIAGNOSIS — I672 Cerebral atherosclerosis: Secondary | ICD-10-CM | POA: Diagnosis not present

## 2022-10-11 DIAGNOSIS — R0600 Dyspnea, unspecified: Secondary | ICD-10-CM | POA: Diagnosis not present

## 2022-10-11 DIAGNOSIS — E785 Hyperlipidemia, unspecified: Secondary | ICD-10-CM | POA: Diagnosis not present

## 2022-10-11 DIAGNOSIS — R531 Weakness: Secondary | ICD-10-CM | POA: Diagnosis not present

## 2022-10-11 LAB — COMPREHENSIVE METABOLIC PANEL
ALT: 11 U/L (ref 0–44)
AST: 17 U/L (ref 15–41)
Albumin: 3.5 g/dL (ref 3.5–5.0)
Alkaline Phosphatase: 61 U/L (ref 38–126)
Anion gap: 9 (ref 5–15)
BUN: 35 mg/dL — ABNORMAL HIGH (ref 8–23)
CO2: 20 mmol/L — ABNORMAL LOW (ref 22–32)
Calcium: 8.9 mg/dL (ref 8.9–10.3)
Chloride: 114 mmol/L — ABNORMAL HIGH (ref 98–111)
Creatinine, Ser: 1.98 mg/dL — ABNORMAL HIGH (ref 0.44–1.00)
GFR, Estimated: 25 mL/min — ABNORMAL LOW (ref >60)
Glucose, Bld: 118 mg/dL — ABNORMAL HIGH (ref 70–99)
Potassium: 4.1 mmol/L (ref 3.5–5.1)
Sodium: 143 mmol/L (ref 135–145)
Total Bilirubin: 0.5 mg/dL (ref 0.3–1.2)
Total Protein: 7.6 g/dL (ref 6.5–8.1)

## 2022-10-11 LAB — CBC
HCT: 34.2 % — ABNORMAL LOW (ref 36.0–46.0)
Hemoglobin: 10.9 g/dL — ABNORMAL LOW (ref 12.0–15.0)
MCH: 30.3 pg (ref 26.0–34.0)
MCHC: 31.9 g/dL (ref 30.0–36.0)
MCV: 95 fL (ref 80.0–100.0)
Platelets: 145 10*3/uL — ABNORMAL LOW (ref 150–400)
RBC: 3.6 MIL/uL — ABNORMAL LOW (ref 3.87–5.11)
RDW: 15.6 % — ABNORMAL HIGH (ref 11.5–15.5)
WBC: 6 10*3/uL (ref 4.0–10.5)
nRBC: 0 % (ref 0.0–0.2)

## 2022-10-11 LAB — DIFFERENTIAL
Abs Immature Granulocytes: 0.01 10*3/uL (ref 0.00–0.07)
Basophils Absolute: 0 10*3/uL (ref 0.0–0.1)
Basophils Relative: 0 %
Eosinophils Absolute: 0 10*3/uL (ref 0.0–0.5)
Eosinophils Relative: 0 %
Immature Granulocytes: 0 %
Lymphocytes Relative: 39 %
Lymphs Abs: 2.3 10*3/uL (ref 0.7–4.0)
Monocytes Absolute: 0.4 10*3/uL (ref 0.1–1.0)
Monocytes Relative: 7 %
Neutro Abs: 3.2 10*3/uL (ref 1.7–7.7)
Neutrophils Relative %: 54 %

## 2022-10-11 LAB — PROTIME-INR
INR: 1.1 (ref 0.8–1.2)
Prothrombin Time: 14.3 seconds (ref 11.4–15.2)

## 2022-10-11 LAB — VITAMIN B12: Vitamin B-12: 302 pg/mL (ref 180–914)

## 2022-10-11 LAB — APTT: aPTT: 30 seconds (ref 24–36)

## 2022-10-11 LAB — CBG MONITORING, ED: Glucose-Capillary: 91 mg/dL (ref 70–99)

## 2022-10-11 LAB — ETHANOL: Alcohol, Ethyl (B): <10 mg/dL (ref <10)

## 2022-10-11 MED ORDER — HEPARIN SODIUM (PORCINE) 5000 UNIT/ML IJ SOLN
5000.0000 [IU] | Freq: Three times a day (TID) | INTRAMUSCULAR | Status: DC
  Administered 2022-10-12 – 2022-10-13 (×4): 5000 [IU] via SUBCUTANEOUS
  Filled 2022-10-11 (×4): qty 1

## 2022-10-11 MED ORDER — SENNOSIDES-DOCUSATE SODIUM 8.6-50 MG PO TABS: 1.0000 | ORAL_TABLET | Freq: Every evening | ORAL | Status: DC | PRN

## 2022-10-11 MED ORDER — SODIUM CHLORIDE 0.9% FLUSH: 3.0000 mL | Freq: Once | INTRAVENOUS | Status: DC

## 2022-10-11 MED ORDER — ACETAMINOPHEN 650 MG RE SUPP: 650.0000 mg | RECTAL | Status: DC | PRN

## 2022-10-11 MED ORDER — ROSUVASTATIN CALCIUM 20 MG PO TABS
20.0000 mg | ORAL_TABLET | Freq: Every day | ORAL | Status: DC
  Administered 2022-10-12: 20 mg via ORAL
  Filled 2022-10-11: qty 1

## 2022-10-11 MED ORDER — INSULIN ASPART 100 UNIT/ML IJ SOLN: 0.0000 [IU] | Freq: Every day | INTRAMUSCULAR | Status: DC

## 2022-10-11 MED ORDER — DICLOFENAC SODIUM 1 % EX GEL: 2.0000 g | Freq: Four times a day (QID) | CUTANEOUS | Status: DC

## 2022-10-11 MED ORDER — STROKE: EARLY STAGES OF RECOVERY BOOK
Freq: Once | Status: DC
  Filled 2022-10-11: qty 1

## 2022-10-11 MED ORDER — ASPIRIN 81 MG PO TBEC
81.0000 mg | DELAYED_RELEASE_TABLET | Freq: Every day | ORAL | Status: DC
  Administered 2022-10-12 – 2022-10-13 (×2): 81 mg via ORAL
  Filled 2022-10-11 (×2): qty 1

## 2022-10-11 MED ORDER — POLYETHYLENE GLYCOL 3350 17 G PO PACK: 17.0000 g | PACK | Freq: Every day | ORAL | Status: DC | PRN

## 2022-10-11 MED ORDER — INSULIN ASPART 100 UNIT/ML IJ SOLN
0.0000 [IU] | Freq: Three times a day (TID) | INTRAMUSCULAR | Status: DC
  Administered 2022-10-12: 2 [IU] via SUBCUTANEOUS
  Filled 2022-10-11: qty 1

## 2022-10-11 MED ORDER — ACETAMINOPHEN 160 MG/5ML PO SOLN: 650.0000 mg | ORAL | Status: DC | PRN

## 2022-10-11 MED ORDER — HYDRALAZINE HCL 20 MG/ML IJ SOLN: 5.0000 mg | Freq: Three times a day (TID) | INTRAMUSCULAR | Status: DC | PRN

## 2022-10-11 MED ORDER — ACETAMINOPHEN 325 MG PO TABS: 650.0000 mg | ORAL_TABLET | ORAL | Status: DC | PRN

## 2022-10-11 NOTE — Assessment & Plan Note (Addendum)
MRI with acute infarct of right posterior basal ganglia. Neurology was consulted-pending recommendations Completed stroke workup with normal echocardiogram, only showing grade 1 diastolic dysfunction.  A1c of 6.1 and lipid panel normal.  B12 at 302 PT is recommending SNF. -Start her on Plavix as she was already on aspirin-will need DAPT for 3 weeks followed by Plavix only -Continue with statin

## 2022-10-11 NOTE — Assessment & Plan Note (Addendum)
A1c of 6.1 - Home Sitagliptin not resumed on admission - Rosuvastatin 20 mg daily resumed - Insulin SSI with at bedtime coverage ordered

## 2022-10-11 NOTE — H&P (Signed)
History and Physical   Alicia Garner ZWC:[id number] DOB: January 09, 1941 DOA: 10/11/2022  PCP: [facility]  Patient coming from: Home  I have personally briefly reviewed patient's old medical records in [place].  Chief Concern: Left-sided weakness and difficulty walking  HPI: Alicia Garner is an 82 year old female with history of hypertension, hyperlipidemia, anemia of chronic kidney disease, CKD stage IV, who presents emergency department for chief concerns of left-sided weakness and difficulty walking per daughter.  Initial vitals in the emergency department showed temperature of 98.2, respiration rate of 16, heart rate 95, blood pressure 134/81, SpO2 95% on room air.  Serum sodium is 143, potassium 4.1, chloride 114, bicarb 20, BUN 35, serum creatinine 1.98, EGFR 25, nonfasting blood glucose 118, WBC 6.0, hemoglobin 10.9, platelets of 145.  CT of the head without contrast: Was read as no acute intracranial findings.  Advance chronic microvascular ischemic change and mild cerebral volume loss.  ED treatment: None ----------------------- At bedside, she is able to tell me her first and last name. She was not able to tell her age (she says she's 4), current year, current month (she says it's June). She was able to tell me she's in the hospital. Initially, patient states that it is her sister in the room. Per daughter, this is patient's baseline.  On Sunday, she woke up, patient could hardly stand up. He rdaughter got a walker, and patient was noted to lean on the walker towards her right side. Daughter noted that as patient was ambulating with the walker, she was dragging her right leg.   On Saturday, patient was at her baseline self, ambulating easily without a walker.   Social history: She lives at home with her daughter, Alicia Garner. She denies history tobacco, etoh, and recreational drug use. She is retired and formerly worked in [employer].  ROS: Unable to complete as  patient has moderate to advanced dementia  ED Course: Discussed with emergency medicine provider, patient requiring hospitalization for chief concerns of left-sided weakness and difficulty walking.  Assessment/Plan  Principal Problem:   Left-sided weakness Active Problems:   Type 2 diabetes mellitus with hyperlipidemia ([name])   Essential hypertension   Weakness   CKD (chronic kidney disease) stage 4, GFR 15-29 ml/min ([name])   Assessment and Plan:  * Left-sided weakness Workup in progress, differential diagnosis include TIA versus stroke versus B12 deficiency - MRI of the brain without contrast has been ordered - Check B12 - If MRI is positive for new acute stroke, will order complete echo and consult neurology - AM team to consult neurology pending MRI - Permissive hypertension in place, pending MRI - Frequent neurochecks - Admit to telemetry medical, observation  Type 2 diabetes mellitus with hyperlipidemia ([name]) - Home Sitagliptin not resumed on admission - Rosuvastatin 20 mg daily resumed - Insulin SSI with at bedtime coverage ordered  Essential hypertension - Home amlodipine 10 mg daily, losartan 50 mg daily, metoprolol succinate 25 mg daily not resumed on admission pending MRI of the brain - Hydralazine 5 mg IV every 3 hours as needed for SBP greater than 180, 4 days ordered  CKD (chronic kidney disease) stage 4, GFR 15-29 ml/min ([name]) - At baseline  Weakness - Fall precautions - Check B12  Chart reviewed.   DVT prophylaxis: Heparin 5000 units subcutaneous every 8 hours Code Status: full code, discussed with Alicia Garner, daughter at bedside Diet: N.p.o. except for sips with meds Family Communication: Updated Alicia Garner, how is one of patient's healthcare power of  attorney. Alicia Garner states the other HPOA is an elder sister. Disposition Plan: Pending clinical course, pending MRI Consults called: None at this time Admission status: Telemetry medical, observation  Past Medical  History:  Diagnosis Date   Diabetes mellitus without complication ([place])    Hyperlipidemia    Hypertension    Stroke [facility])    History reviewed. No pertinent surgical history.  Social History:  reports that she has never smoked. She has never used smokeless tobacco. She reports that she does not drink alcohol and does not use drugs.  Allergies  Allergen Reactions   Lisinopril Swelling   Family History  Problem Relation Age of Onset   Hypertension Mother    Diabetes Mother    Diabetes Father    Hypertension Father    Family history: Family history reviewed and not pertinent.  Prior to Admission medications   Medication Sig Start Date End Date Taking? Authorizing Provider  amLODipine (NORVASC) 10 MG tablet Take 10 mg by mouth daily. 10/10/21  Yes [provider]  aspirin EC 81 MG tablet Take 81 mg by mouth daily. Swallow whole.   Yes [provider]  Cholecalciferol 25 MCG (1000 UT) capsule Take by mouth. 12/22/18  Yes [provider]  colchicine 0.6 MG tablet Take 0.5 tablets (0.3 mg total) by mouth every 3 (three) days. 12/10/21  Yes Wieting, Richard, MD  diclofenac Sodium (VOLTAREN) 1 % GEL Apply 2 g topically 4 (four) times daily.   Yes [provider]  losartan (COZAAR) 50 MG tablet Take 50 mg by mouth daily. 10/10/21  Yes [provider]  polyethylene glycol (MIRALAX / GLYCOLAX) 17 g packet Take 17 g by mouth daily as needed for moderate constipation. 12/08/21  Yes Wieting, Richard, MD  rosuvastatin (CRESTOR) 20 MG tablet Take 20 mg by mouth daily. 01/20/22 01/20/23 Yes [provider]  sitaGLIPtin (JANUVIA) 25 MG tablet Take 1 tablet (25 mg total) by mouth once daily For diabetes 04/30/21  Yes [provider]  metoprolol succinate (TOPROL-XL) 25 MG 24 hr tablet Take 1 tablet (25 mg total) by mouth daily. Take with or immediately following a meal. Patient not taking: Reported on 10/11/2022 12/09/21   Loletha Grayer, MD   predniSONE (DELTASONE) 5 MG tablet 2 tabs po daily for two days, then 1 tab po daily for two days then 0.5 tabs po daily for two days Patient not taking: Reported on 10/11/2022 12/08/21   Loletha Grayer, MD  sodium bicarbonate 650 MG tablet Take 1 tablet (650 mg total) by mouth 2 (two) times daily. Patient not taking: Reported on 10/11/2022 12/08/21   Loletha Grayer, MD   Physical Exam: Vitals:   10/11/22 1201 10/11/22 1459 10/11/22 1616  BP: 134/81    Pulse: 95    Resp: 16    Temp: 98.2 F (36.8 C)  98.1 F (36.7 C)  TempSrc: Oral  Oral  SpO2: 95%    Weight:  72.6 kg   Height:  5\' 4"  (1.626 m)    Constitutional: appears age-appropriate, NAD, calm, comfortable Eyes: PERRL, lids and conjunctivae normal HENMT: Mucous membranes are moist. Posterior pharynx clear of any exudate or lesions. Age-appropriate dentition. Hearing appropriate. Right sided mouth edge droop Neck: normal, supple, no masses, no thyromegaly Respiratory: clear to auscultation bilaterally, no wheezing, no crackles. Normal respiratory effort. No accessory muscle use.  Cardiovascular: Regular rate and rhythm, no murmurs / rubs / gallops. No extremity edema. 2+ pedal pulses. No carotid bruits.  Abdomen: no tenderness, no  masses palpated, no hepatosplenomegaly. Bowel sounds positive.  Musculoskeletal: no clubbing / cyanosis. No joint deformity upper and lower extremities. Good ROM, no contractures, no atrophy. Normal muscle tone.  Skin: no rashes, lesions, ulcers. No induration Neurologic: Sensation intact. Strength 5/5 in all 4.  Psychiatric: Normal judgment and insight. Alert and oriented x self and location. Normal mood.   EKG: independently reviewed, showing sinus rhythm with rate of 96, QTc 459  Chest x-ray on Admission: I personally reviewed and I agree with radiologist reading as below.  DG Chest Port 1 View  Result Date: 10/11/2022 CLINICAL DATA:  Dyspnea on exertion EXAM: PORTABLE CHEST 1 VIEW COMPARISON:   12/08/2021 FINDINGS: The heart size and mediastinal contours are within normal limits. Both lungs are clear. The visualized skeletal structures are unremarkable. IMPRESSION: No active disease. Electronically Signed   By: Donavan Foil M.D.   On: 10/11/2022 19:00   CT HEAD WO CONTRAST  Result Date: 10/11/2022 CLINICAL DATA:  Neuro deficit, acute, stroke suspected EXAM: CT HEAD WITHOUT CONTRAST TECHNIQUE: Contiguous axial images were obtained from the base of the skull through the vertex without intravenous contrast. RADIATION DOSE REDUCTION: This exam was performed according to the departmental dose-optimization program which includes automated exposure control, adjustment of the mA and/or kV according to patient size and/or use of iterative reconstruction technique. COMPARISON:  09/13/2021 FINDINGS: Brain: No evidence of acute infarction, hemorrhage, hydrocephalus, extra-axial collection or mass lesion/mass effect. Extensive low-density changes within the periventricular and subcortical white matter compatible with chronic microvascular ischemic change. Mild diffuse cerebral volume loss. Vascular: Atherosclerotic calcifications involving the large vessels of the skull base. No unexpected hyperdense vessel. Skull: Normal. Negative for fracture or focal lesion. Sinuses/Orbits: No acute finding. Other: None. IMPRESSION: 1. No acute intracranial findings. 2. Advanced chronic microvascular ischemic change and mild cerebral volume loss. Electronically Signed   By: Davina Poke D.O.   On: 10/11/2022 12:59    Labs on Admission: I have personally reviewed following labs  CBC: Recent Labs  Lab 10/11/22 1207  WBC 6.0  NEUTROABS 3.2  HGB 10.9*  HCT 34.2*  MCV 95.0  PLT 818*   Basic Metabolic Panel: Recent Labs  Lab 10/11/22 1207  NA 143  K 4.1  CL 114*  CO2 20*  GLUCOSE 118*  BUN 35*  CREATININE 1.98*  CALCIUM 8.9   GFR: Estimated Creatinine Clearance: 22.1 mL/min (A) (by C-G formula based  on SCr of 1.98 mg/dL (H)).  Liver Function Tests: Recent Labs  Lab 10/11/22 1207  AST 17  ALT 11  ALKPHOS 61  BILITOT 0.5  PROT 7.6  ALBUMIN 3.5   Coagulation Profile: Recent Labs  Lab 10/11/22 1207  INR 1.1   Urine analysis:    Component Value Date/Time   COLORURINE YELLOW (A) 12/04/2021 1351   APPEARANCEUR CLOUDY (A) 12/04/2021 1351   LABSPEC 1.014 12/04/2021 1351   PHURINE 5.0 12/04/2021 1351   GLUCOSEU NEGATIVE 12/04/2021 1351   HGBUR SMALL (A) 12/04/2021 1351   BILIRUBINUR NEGATIVE 12/04/2021 1351   KETONESUR NEGATIVE 12/04/2021 1351   PROTEINUR 100 (A) 12/04/2021 1351   NITRITE NEGATIVE 12/04/2021 1351   LEUKOCYTESUR LARGE (A) 12/04/2021 1351   This document was prepared using Dragon Voice Recognition software and may include unintentional dictation errors.  Dr. Tobie Poet [facility]  If 7PM-7AM, please contact overnight-coverage provider If 7AM-7PM, please contact day coverage provider [email or web address]  10/11/2022, 7:13 PM

## 2022-10-11 NOTE — Hospital Course (Addendum)
Ms. Gelene Recktenwald is an 82 year old female with history of hypertension, hyperlipidemia, anemia of chronic kidney disease, CKD stage IV, who presents emergency department for chief concerns of left-sided weakness and difficulty walking per daughter.  Initial vitals in the emergency department showed temperature of 98.2, respiration rate of 16, heart rate 95, blood pressure 134/81, SpO2 95% on room air.  Serum sodium is 143, potassium 4.1, chloride 114, bicarb 20, BUN 35, serum creatinine 1.98, EGFR 25, nonfasting blood glucose 118, WBC 6.0, hemoglobin 10.9, platelets of 145.  CT of the head without contrast: Was read as no acute intracranial findings.  Advance chronic microvascular ischemic change and mild cerebral volume loss.  ED treatment: None  1/23: Blood pressure mildly elevated at 164/86.  MRI with acute ischemic nonhemorrhagic posterior right basal ganglia infarct.  Neurology was consulted and patient will complete stroke workup.  MRI also shows age-related cerebellar atrophy with advanced chronic microvascular ischemic disease with innumerable chronic microhemorrhages, most pronounced about the brainstem and deep gray nuclei.  Overall pattern favored to reflect changes of chronic poorly controlled hypertension, although amyloid angiopathy remains a consideration.  Echocardiogram with normal EF and grade 1 diastolic dysfunction, otherwise normal study. Lipid panel normal.  B12 at 302, would like to see above 400, started on p.o. supplement. Also started on Plavix, patient was on aspirin at home.  DAPT for 3 weeks followed by Plavix only. PT is recommending SNF.  1/24: Hemodynamically stable, mildly elevated blood pressure at 142/74, Crestor dose was increased to 40 by neurology.  We can slowly add her antihypertensives to normalize her blood pressure over the next week.  Adding home amlodipine at a lower dose of 5 mg daily.  Patient was seen and examined today.  No new complaints.  She was  insisting on going home.  Daughter agrees to provide extra help along with home health services which were ordered.  She does not want to go to a rehab facility.  [facility] home health services ordered.  She is being discharged on DAPT with aspirin and Plavix for 3 weeks followed by Plavix only.  Patient need to see outpatient neurology for her stroke workup.  Patient was instructed to hold lisinopril for 3 days and restart amlodipine from tomorrow.  Patient has to follow-up with outpatient neurology for stroke follow-up.  Patient will continue with the rest of her home medications and need to have a close follow-up with providers for further management.

## 2022-10-11 NOTE — Assessment & Plan Note (Addendum)
-  Home amlodipine 10 mg daily, losartan 50 mg daily, metoprolol succinate 25 mg daily not resumed on admission for permissive hypertension. - Hydralazine 5 mg IV every 3 hours as needed for SBP greater than 180, 4 days ordered

## 2022-10-11 NOTE — ED Triage Notes (Addendum)
Pt to ED via [facility] from home. Family called dueto pt having left sided weakness and trouble walking. Pt was fine going to bed Saturday. Pt started having weakness Sunday. Symptoms are still present today. Family called PCP and advised to come to ED.   Pt alert to self only. Pt with hx CVA and TIAs- no known deficits.   Ems VS:  BP 154/88 Cbg 119 HR 75 99% RA

## 2022-10-11 NOTE — Assessment & Plan Note (Signed)
-  At baseline 

## 2022-10-11 NOTE — Assessment & Plan Note (Addendum)
-  Fall precautions - B12 at 302, preferably should be above 400 -Starting on B12 supplement -Check vitamin D levels -PT/OT recommending SNF

## 2022-10-11 NOTE — ED Provider Notes (Signed)
[facility] Provider Note    Event Date/Time   First MD Initiated Contact with Patient 10/11/22 1458     (approximate)   History   Weakness   HPI  Alicia Garner is a 82 y.o. female with a history of chronic kidney disease, diabetes, CVA who presents with left-sided weakness.  Daughter reports yesterday morning patient woke up, could not get out of bed on her own which is atypical, patient is typically able to ambulate by herself occasionally with a walker.  Patient cannot bear weight on the left leg and her left arm was weak as well.  This continued until today.  Called her family doctor who recommend ED evaluation.  Patient has a history of mild dementia, history as per daughter     Physical Exam   Triage Vital Signs: ED Triage Vitals  Enc Vitals Group     BP 10/11/22 1201 134/81     Pulse Rate 10/11/22 1201 95     Resp 10/11/22 1201 16     Temp 10/11/22 1201 98.2 F (36.8 C)     Temp Source 10/11/22 1201 Oral     SpO2 10/11/22 1201 95 %     Weight 10/11/22 1459 72.6 kg (160 lb 0.9 oz)     Height 10/11/22 1459 1.626 m (5\' 4" )     Head Circumference --      Peak Flow --      Pain Score 10/11/22 1203 0     Pain Loc --      Pain Edu? --      Excl. in [place]? --     Most recent vital signs: Vitals:   10/11/22 1201 10/11/22 1616  BP: 134/81   Pulse: 95   Resp: 16   Temp: 98.2 F (36.8 C) 98.1 F (36.7 C)  SpO2: 95%      General: Awake, no distress.  CV:  Good peripheral perfusion.  Resp:  Normal effort.  bd:  No distention.  Other:  Cranial nerves II through XII appear normal, patient does seem to have drift on the left arm and some mild weakness with flexion of the left arm, patient is able to lift the left leg against gravity   ED Results / Procedures / Treatments   Labs (all labs ordered are listed, but only abnormal results are displayed) Labs Reviewed  CBC - Abnormal; Notable for the following components:      Result Value   RBC  3.60 (*)    Hemoglobin 10.9 (*)    HCT 34.2 (*)    RDW 15.6 (*)    Platelets 145 (*)    All other components within normal limits  COMPREHENSIVE METABOLIC PANEL - Abnormal; Notable for the following components:   Chloride 114 (*)    CO2 20 (*)    Glucose, Bld 118 (*)    BUN 35 (*)    Creatinine, Ser 1.98 (*)    GFR, Estimated 25 (*)    All other components within normal limits  PROTIME-INR  APTT  DIFFERENTIAL  ETHANOL  VITAMIN B12  CBG MONITORING, ED     EKG     RADIOLOGY CT head no acute abnormality reported    PROCEDURES:  Critical Care performed:   Procedures   MEDICATIONS ORDERED IN ED: Medications  sodium chloride flush (NS) 0.9 % injection 3 mL ( Intravenous Canceled Entry 10/11/22 1217)   stroke: early stages of recovery book (0 each Does not apply Hold 10/11/22  1553)  acetaminophen (TYLENOL) tablet 650 mg (has no administration in time range)    Or  acetaminophen (TYLENOL) 160 MG/5ML solution 650 mg (has no administration in time range)    Or  acetaminophen (TYLENOL) suppository 650 mg (has no administration in time range)  senna-docusate (Senokot-S) tablet 1 tablet (has no administration in time range)  heparin injection 5,000 Units (has no administration in time range)     IMPRESSION / MDM / ASSESSMENT AND PLAN / ED COURSE  I reviewed the triage vital signs and the nursing notes. Patient's presentation is most consistent with acute presentation with potential threat to life or bodily function.  Patient presents with left-sided weakness in the setting of diabetes hypertension chronic kidney disease and a prior history of stroke.  Highly concerning for CVA versus TIA  Does have left-sided drift, some weakness as above.  CT scan is overall reassuring, lab work demonstrates chronic kidney disease, no other significant abnormalities.  Feel patient will require admission for MRI, neurology evaluation, will discuss with the hospitalist  Discussed with  hospitalist Dr. Tobie Poet who will admit the patient      FINAL CLINICAL IMPRESSION(S) / ED DIAGNOSES   Final diagnoses:  Cerebrovascular accident (CVA), unspecified mechanism ([place])     Rx / DC Orders   ED Discharge Orders     None        Note:  This document was prepared using Dragon voice recognition software and may include unintentional dictation errors.   Lavonia Drafts, MD 10/11/22 1655

## 2022-10-11 NOTE — ED Triage Notes (Signed)
Pt to ED via [facility] from home for left sided weakness. Per family pt was normal when she went to bed on Saturday night and then symptoms started on Sunday. Pt states that she is not sure why she is here. Pt has hx/o CVA and TIA.

## 2022-10-12 ENCOUNTER — Inpatient Hospital Stay: Payer: Medicare HMO

## 2022-10-12 ENCOUNTER — Observation Stay (HOSPITAL_COMMUNITY)
Admit: 2022-10-12 | Discharge: 2022-10-12 | Disposition: A | Discharge status: Home / Self Care | Payer: Medicare HMO | Attending: Internal Medicine | Admitting: Internal Medicine

## 2022-10-12 DIAGNOSIS — G319 Degenerative disease of nervous system, unspecified: Secondary | ICD-10-CM | POA: Diagnosis present

## 2022-10-12 DIAGNOSIS — R531 Weakness: Secondary | ICD-10-CM | POA: Diagnosis present

## 2022-10-12 DIAGNOSIS — I6381 Other cerebral infarction due to occlusion or stenosis of small artery: Secondary | ICD-10-CM | POA: Diagnosis present

## 2022-10-12 DIAGNOSIS — E1122 Type 2 diabetes mellitus with diabetic chronic kidney disease: Secondary | ICD-10-CM | POA: Diagnosis present

## 2022-10-12 DIAGNOSIS — E785 Hyperlipidemia, unspecified: Secondary | ICD-10-CM | POA: Diagnosis present

## 2022-10-12 DIAGNOSIS — I129 Hypertensive chronic kidney disease with stage 1 through stage 4 chronic kidney disease, or unspecified chronic kidney disease: Secondary | ICD-10-CM | POA: Diagnosis present

## 2022-10-12 DIAGNOSIS — D631 Anemia in chronic kidney disease: Secondary | ICD-10-CM | POA: Diagnosis present

## 2022-10-12 DIAGNOSIS — G8194 Hemiplegia, unspecified affecting left nondominant side: Secondary | ICD-10-CM | POA: Diagnosis present

## 2022-10-12 DIAGNOSIS — R29710 NIHSS score 10: Secondary | ICD-10-CM | POA: Diagnosis present

## 2022-10-12 DIAGNOSIS — N184 Chronic kidney disease, stage 4 (severe): Secondary | ICD-10-CM | POA: Diagnosis present

## 2022-10-12 DIAGNOSIS — Z8249 Family history of ischemic heart disease and other diseases of the circulatory system: Secondary | ICD-10-CM | POA: Diagnosis not present

## 2022-10-12 DIAGNOSIS — Z833 Family history of diabetes mellitus: Secondary | ICD-10-CM | POA: Diagnosis not present

## 2022-10-12 DIAGNOSIS — Z7982 Long term (current) use of aspirin: Secondary | ICD-10-CM | POA: Diagnosis not present

## 2022-10-12 DIAGNOSIS — I10 Essential (primary) hypertension: Secondary | ICD-10-CM | POA: Diagnosis not present

## 2022-10-12 DIAGNOSIS — F03A0 Unspecified dementia, mild, without behavioral disturbance, psychotic disturbance, mood disturbance, and anxiety: Secondary | ICD-10-CM | POA: Diagnosis present

## 2022-10-12 DIAGNOSIS — I6300 Cerebral infarction due to thrombosis of unspecified precerebral artery: Secondary | ICD-10-CM

## 2022-10-12 DIAGNOSIS — Z888 Allergy status to other drugs, medicaments and biological substances status: Secondary | ICD-10-CM | POA: Diagnosis not present

## 2022-10-12 DIAGNOSIS — I6389 Other cerebral infarction: Secondary | ICD-10-CM | POA: Diagnosis not present

## 2022-10-12 DIAGNOSIS — I6339 Cerebral infarction due to thrombosis of other cerebral artery: Secondary | ICD-10-CM | POA: Diagnosis not present

## 2022-10-12 DIAGNOSIS — E1169 Type 2 diabetes mellitus with other specified complication: Secondary | ICD-10-CM | POA: Diagnosis not present

## 2022-10-12 DIAGNOSIS — Z79899 Other long term (current) drug therapy: Secondary | ICD-10-CM | POA: Diagnosis not present

## 2022-10-12 LAB — LIPID PANEL
Cholesterol: 166 mg/dL (ref 0–200)
HDL: 69 mg/dL (ref >40)
LDL Cholesterol: 86 mg/dL (ref 0–99)
Total CHOL/HDL Ratio: 2.4 RATIO
Triglycerides: 54 mg/dL (ref <150)
VLDL: 11 mg/dL (ref 0–40)

## 2022-10-12 LAB — ECHOCARDIOGRAM COMPLETE
AR max vel: 2.93 cm2
AV Area VTI: 2.71 cm2
AV Area mean vel: 2.78 cm2
AV Mean grad: 4 mmHg
AV Peak grad: 7 mmHg
Ao pk vel: 1.32 m/s
Area-P 1/2: 3.19 cm2
Calc EF: 61.8 %
Height: 64 in
MV M vel: 2.47 m/s
MV Peak grad: 24.5 mmHg
S' Lateral: 2.7 cm
Single Plane A2C EF: 62.5 %
Single Plane A4C EF: 60 %
Weight: 2560.86 oz

## 2022-10-12 LAB — CBG MONITORING, ED
Glucose-Capillary: 92 mg/dL (ref 70–99)
Glucose-Capillary: 85 mg/dL (ref 70–99)
Glucose-Capillary: 179 mg/dL — ABNORMAL HIGH (ref 70–99)
Glucose-Capillary: 86 mg/dL (ref 70–99)

## 2022-10-12 LAB — HEMOGLOBIN A1C
Hgb A1c MFr Bld: 6.1 % — ABNORMAL HIGH (ref 4.8–5.6)
Mean Plasma Glucose: 128.37 mg/dL

## 2022-10-12 LAB — GLUCOSE, CAPILLARY: Glucose-Capillary: 100 mg/dL — ABNORMAL HIGH (ref 70–99)

## 2022-10-12 MED ORDER — VITAMIN B-12 1000 MCG PO TABS
1000.0000 ug | ORAL_TABLET | Freq: Every day | ORAL | Status: DC
  Administered 2022-10-12 – 2022-10-13 (×2): 1000 ug via ORAL
  Filled 2022-10-12: qty 1
  Filled 2022-10-12: qty 2

## 2022-10-12 MED ORDER — CLOPIDOGREL BISULFATE 75 MG PO TABS
75.0000 mg | ORAL_TABLET | Freq: Every day | ORAL | Status: DC
  Administered 2022-10-12 – 2022-10-13 (×2): 75 mg via ORAL
  Filled 2022-10-12 (×2): qty 1

## 2022-10-12 MED ORDER — ROSUVASTATIN CALCIUM 20 MG PO TABS
40.0000 mg | ORAL_TABLET | Freq: Every day | ORAL | Status: DC
  Administered 2022-10-13: 40 mg via ORAL
  Filled 2022-10-12: qty 2

## 2022-10-12 MED ORDER — ORAL CARE MOUTH RINSE: 15.0000 mL | OROMUCOSAL | Status: DC | PRN

## 2022-10-12 NOTE — ED Notes (Addendum)
Charting error: note made under wrong pt's chart.

## 2022-10-12 NOTE — Consult Note (Signed)
Neurology Consultation Reason for Consult: Stroke Referring Physician: Reesa Chew, S  CC: Left-sided weakness  History is obtained from: Patient, daughter  HPI: Alicia Garner is a 82 y.o. female with a history of dementia as well as hypertension, hyperlipidemia, diabetes who presents with left-sided weakness that started on Sunday.  She waited over a day to bring her in, but when she did she was found to have a subcortical stroke and is admitted for workup of the same.  She states that she is improving, but her daughter thinks she is just saying whatever she thinks will get her home faster.   LKW: Sunday tnk given?: no, out of window   Past Medical History:  Diagnosis Date   Diabetes mellitus without complication ([place])    Hyperlipidemia    Hypertension    Stroke [facility])      Family History  Problem Relation Age of Onset   Hypertension Mother    Diabetes Mother    Diabetes Father    Hypertension Father      Social History:  reports that she has never smoked. She has never used smokeless tobacco. She reports that she does not drink alcohol and does not use drugs.   Exam: Current vital signs: BP 135/75   Pulse 100   Temp 98.6 F (37 C) (Oral)   Resp 17   Ht 5\' 4"  (1.626 m)   Wt 72.6 kg   LMP  (LMP Unknown)   SpO2 96%   BMI 27.47 kg/m  Vital signs in last 24 hours: Temp:  [98 F (36.7 C)-98.6 F (37 C)] 98.6 F (37 C) (01/23 1257) Pulse Rate:  [62-100] 100 (01/23 1257) Resp:  [14-18] 17 (01/23 1257) BP: (123-164)/(61-90) 135/75 (01/23 1257) SpO2:  [96 %-100 %] 96 % (01/23 1257) Weight:  [72.6 kg] 72.6 kg (01/22 1459)   Physical Exam  Appears well-developed and well-nourished.   Neuro: Mental Status: Patient is awake, alert, oriented to person, but unable to give the month or year.  Cranial Nerves: II: Visual Fields are full. Pupils are equal, round, and reactive to light.   III,IV, VI: EOMI without ptosis or diploplia.  V: Facial sensation is symmetric to  temperature VII: Facial movement is symmetric.  VIII: hearing is intact to voice X: Uvula elevates symmetrically XI: Shoulder shrug is symmetric. XII: tongue is midline without atrophy or fasciculations.  Motor: Tone is normal. Bulk is normal. 5/5 strength was present on the right, she has 4/5 weakness of the left arm and leg Sensory: Sensation is symmetric to light touch and temperature in the arms and legs. Cerebellar: She has impairment of finger-nose-finger and heel-knee-shin on the left consistent with weakness     I have reviewed labs in epic and the results pertinent to this consultation are: LDL 86 A1c pending Echo - no embolic source Carotids-negative  I have reviewed the images obtained: MRI brain-subcortical stroke on the right  Impression: 82 year old female with a history of diabetes, hypertension, hyperlipidemia who presents with left-sided weakness and discoordination with subcortical stroke.  This is likely due to small vessel risk factors and her small vessel risk factors should be managed aggressively.  Recommendations: 1) aspirin and Plavix for 3 weeks followed by aspirin monotherapy 2) increase Crestor to 40 mg nightlye crestor to 40mg  daily target LDL less than 70 3) DM management with goal A1c less than seven 4) can begin gently lowering blood pressure with a goal of normalizing over the course of a couple of weeks 5)  PT, OT, ST 6) neurology will be available on an as-needed basis.  Roland Rack, MD [facility] [phone]  If 7pm- 7am, please page neurology on call as listed in [place].

## 2022-10-12 NOTE — Evaluation (Addendum)
Clinical/Bedside Swallow Evaluation Patient Details  Name: Alicia Garner MRN: [id number] Date of Birth: Apr 16, 1941  Today's Date: 10/12/2022 Time: SLP Start Time (ACUTE ONLY): 1400 SLP Stop Time (ACUTE ONLY): 1500 SLP Time Calculation (min) (ACUTE ONLY): 60 min  Past Medical History:  Past Medical History:  Diagnosis Date   Diabetes mellitus without complication ([place])    Hyperlipidemia    Hypertension    Stroke [facility])    Past Surgical History: History reviewed. No pertinent surgical history. HPI:  Per chart review, pt is an 82 year old female with history of hypertension, hyperlipidemia, anemia of chronic kidney disease, CKD stage IV, who presents emergency department for chief concerns of left-sided weakness and difficulty walking per daughter. CT of the head without contrast: Was read as no acute intracranial findings. Advance chronic microvascular ischemic change and mild cerebral volume loss. At bedside, she is able to tell me her first and last name. She was not able to tell her age (she says she's 55), current year, current month (she says it's June). She was able to tell me she's in the hospital. Initially, patient states that it is her sister in the room. Per daughter, this is patient's baseline. On Sunday, she woke up, patient could hardly stand up. Her daughter got a walker, and patient was noted to lean on the walker towards her right side. Daughter noted that as patient was ambulating with the walker, she was dragging her right leg. On Saturday, patient was at her baseline self, ambulating easily without a walker. She lives at home with her daughter.   Assessment / Plan / Recommendation  Clinical Impression  Pt seen for BSE. Pt appeared in pleasant mood, cooperative, and mildly confused throughout eval. Pt's daughter present upon arrival and left during eval. Neurologist arrived throughout eval to assess pt and answer questions. Pt's daughter reported baseline Dementia (years). Pt left  sitting up right in bed with PT present, call button in reach, and bed alarm set.   Pt on room air, afebrile, WBC WFL. Oral mech exam completed and WFL. Noted several missing dentition.  Pt does not appear to present with oropharyngeal dysphagia- oropharyngeal swallowing appeared functional. Trials of ice chips (x4), thin liquids (~6 oz water and apple juice), and solids (half graham cracker). During oral phase, pt demonstrated no overt s/s of oral dysphagia c/b appropriate and timely mastication, timely bolus A/P transit, and clear oral cavity post-swallow. During pharyngeal phase, pt demonstrated seemingly timely swallow, seemingly adequate hyolaryngeal elevation, and clear vocal quality post-swallow. No decline in respiratory presentation was noted post trials.   SLP provided pt education on general aspiration precautions including small sips/bites, breaks between sips/bites, and eating slowly. Pt appears at mild risk of aspiration d/t baseline Dementia, but reduced when given support in following general aspiration precautions.  Recommend regular diet (cut meats, moistened food for ease of swallowing) w/ thin liquids-monitor any straw use. Recommend assistance w/ set up of tray, intermittent supervision during meals to provide cues as needed, sitting upright during meals, small bites/sips, eating slowly, and breaks between bites/sips. Recommend medications whole w/ puree d/t cognitive decline for swallowing overall.  No further skilled ST services indicated at this time as patient appears at her baseline per this evaluation, daughter's agreement, and nursing report as well. Pt's daughter and nursing stated pt has been eating and drinking without deficits noted today. Nursing to reconsult if any new needs arise during admit.  Addendum regarding cognitive/linguistic assessment: Pt was verbal, followed command, answered daughter/SLP/neurology's questions  in the context of basic communication. Pt has  baseline of cognitive decline (diagnosed Dementia per daughter), but is able to engage in functional communication in ADL with cues. Pt often looked to daughter to answer more complex questions, which appeared baseline for pt to do. Daughter denied any new change in pt's speech/communication at this time. No further skilled ST services indicated as pt appears at baseline.  SLP Visit Diagnosis: Dysphagia, unspecified (R13.10)    Aspiration Risk  Mild aspiration risk (d/t baseline dementia)    Diet Recommendation  Recommend regular diet (cut meats, moistened food for ease of swallowing) w/ thin liquids-monitor any straw use. Recommend assistance w/ set up of tray, intermittent supervision during meals to provide cues as needed, sitting upright during meals, small bites/sips, eating slowly, and breaks between bites/sips.   Medication Administration: Whole meds with puree    Other  Recommendations Recommended Consults:  (N/A) Oral Care Recommendations: Oral care QID;Oral care before and after PO    Recommendations for follow up therapy are one component of a multi-disciplinary discharge planning process, led by the attending physician.  Recommendations may be updated based on patient status, additional functional criteria and insurance authorization.  Follow up Recommendations No SLP follow up      Assistance Recommended at Discharge  Full  Functional Status Assessment Patient has had a recent decline in their functional status and/or demonstrates limited ability to make significant improvements in function in a reasonable and predictable amount of time  Frequency and Duration     N/A       Prognosis Prognosis for Safe Diet Advancement: Fair Barriers to Reach Goals: Cognitive deficits;Severity of deficits      Swallow Study   General Date of Onset: 10/11/22 HPI: Per chart review, pt is an 82 year old female with history of hypertension, hyperlipidemia, anemia of chronic kidney disease,  CKD stage IV, who presents emergency department for chief concerns of left-sided weakness and difficulty walking per daughter. CT of the head without contrast: Was read as no acute intracranial findings. Advance chronic microvascular ischemic change and mild cerebral volume loss. At bedside, she is able to tell me her first and last name. She was not able to tell her age (she says she's 76), current year, current month (she says it's June). She was able to tell me she's in the hospital. Initially, patient states that it is her sister in the room. Per daughter, this is patient's baseline. On Sunday, she woke up, patient could hardly stand up. Her daughter got a walker, and patient was noted to lean on the walker towards her right side. Daughter noted that as patient was ambulating with the walker, she was dragging her right leg. On Saturday, patient was at her baseline self, ambulating easily without a walker. She lives at home with her daughter, Type of Study: Bedside Swallow Evaluation Previous Swallow Assessment: N/A Diet Prior to this Study: Regular;Thin liquids Temperature Spikes Noted: No (WBC WFL) Respiratory Status: Room air History of Recent Intubation: No Behavior/Cognition: Cooperative;Pleasant mood;Confused;Distractible;Requires cueing;Alert Oral Cavity Assessment: Within Functional Limits Oral Care Completed by SLP: No Oral Cavity - Dentition: Missing dentition;Adequate natural dentition Vision: Functional for self-feeding Self-Feeding Abilities: Able to feed self;Needs set up;Needs assist Patient Positioning: Upright in bed Baseline Vocal Quality: Normal    Oral/Motor/Sensory Function Overall Oral Motor/Sensory Function: Within functional limits   Ice Chips Ice chips: Within functional limits Presentation: Spoon (x4)   Thin Liquid Thin Liquid: Within functional limits Presentation: Cup;Self Fed;Straw (~6 oz)  Nectar Thick Nectar Thick Liquid: Not tested   Honey Thick Honey Thick  Liquid: Not tested   Puree Puree: Within functional limits Presentation: Spoon;Self Fed (~2 oz)   Solid      Solid: Within functional limits Presentation: Spoon;Self Fed (w/ applesauce to moisten texture)      Randall Hiss Graduate Clinician [place], Speech Pathology   Randall Hiss 10/12/2022,3:20 PM   ADDENDUM: The information in this patient note, response to treatment, and overall treatment plan developed has been reviewed and agreed upon after reviewing documentation. This session was performed under the supervision of this licensed clinician.    Orinda Kenner, MS, CCC-SLP Speech Language Pathologist Re[facility] Services; [place] [phone] (ascom) 10/12/2022, 1652PM

## 2022-10-12 NOTE — Progress Notes (Signed)
Patient arrived from ER

## 2022-10-12 NOTE — Evaluation (Signed)
Occupational Therapy Evaluation Patient Details Name: Alicia Garner MRN: [id number] DOB: [birth year] Today's Date: 10/12/2022   History of Present Illness Alicia Garner is an 65yoF who comes to [facility] on 10/11/22 c acute onset Left sided weakness and difficulty walking. PMH: CKD, DM2, CVA, dementia. MRI brain revealing of "1 cm acute ischemic nonhemorrhagic posterior right basal ganglia  infarct."   Clinical Impression   [name] presents with generalized weakness, limited endurance, impaired balance, and cognitive deficits. Pt is aware she is at a hospital but is not oriented to time or situation. She lives at home with her daughter and has moderate cognitive impairment at baseline. Pt states that she ambulates with a cane at home and does her own dressing, toileting, and showering; however, notes in pt's medical records indicate that she walks with a [phone] and that daughter assists with ADLs. During today's evaluation, pt requires Mod-Max A for bed mobility, for repositioning b/l LE and for trunk control. Pt is unable to come up into full upright standing with Max A and appears weak overall. Little appreciable difference in b/l UE and LE strength. Pt does display increased ROM in R UE compared to L. With no family member present, this therapist cannot determine how far off pt is at present from her PLOF. Given her impaired balance, weakness, and considerable care needs for bed mobility and all OOB mobility, however, DC to a SNF is warranted.    Recommendations for follow up therapy are one component of a multi-disciplinary discharge planning process, led by the attending physician.  Recommendations may be updated based on patient status, additional functional criteria and insurance authorization.   Follow Up Recommendations  Skilled nursing-short term rehab (<3 hours/day)     Assistance Recommended at Discharge Frequent or constant Supervision/Assistance  Patient can return home with the following A lot  of help with walking and/or transfers;A lot of help with bathing/dressing/bathroom;Assistance with cooking/housework;Direct supervision/assist for medications management;Assist for transportation;Direct supervision/assist for financial management;Help with stairs or ramp for entrance    Functional Status Assessment  Patient has had a recent decline in their functional status and demonstrates the ability to make significant improvements in function in a reasonable and predictable amount of time.  Equipment Recommendations  None recommended by OT    Recommendations for Other Services       Precautions / Restrictions Precautions Precautions: Fall Restrictions Weight Bearing Restrictions: No      Mobility Bed Mobility Overal bed mobility: Needs Assistance Bed Mobility: Supine to Sit, Sit to Supine     Supine to sit: Mod assist Sit to supine: Mod assist        Transfers Overall transfer level: Needs assistance   Transfers: Sit to/from Stand Sit to Stand: Mod assist           General transfer comment: unable to come up into full standing position      Balance Overall balance assessment: Needs assistance Sitting-balance support: Bilateral upper extremity supported Sitting balance-Leahy Scale: Fair     Standing balance support: Bilateral upper extremity supported Standing balance-Leahy Scale: Poor Standing balance comment: Hand-held assist; unable to maintain standing balance for any length of time                           ADL either performed or assessed with clinical judgement   ADL Overall ADL's : Needs assistance/impaired Eating/Feeding: Modified independent Eating/Feeding Details (indicate cue type and reason): Mod I for drinking from  straw                 Lower Body Dressing: Moderate assistance Lower Body Dressing Details (indicate cue type and reason): donning socks                     Vision         Perception      Praxis      Pertinent Vitals/Pain Pain Assessment Pain Assessment: No/denies pain     Hand Dominance Right   Extremity/Trunk Assessment Upper Extremity Assessment Upper Extremity Assessment: Generalized weakness   Lower Extremity Assessment Lower Extremity Assessment: Generalized weakness       Communication Communication Communication: HOH   Cognition Arousal/Alertness: Awake/alert   Overall Cognitive Status: History of cognitive impairments - at baseline                                 General Comments: Pt aware she is in a hospital, but states she is at [facility]. She cannot give month or year. She cannot say why she is at hospital, only that "my daughter brought me because I was sick."     General Comments       Exercises     Shoulder Instructions      Home Living Family/patient expects to be discharged to:: Private residence Living Arrangements: Children Available Help at Discharge: Family;Available PRN/intermittently Type of Home: House Home Access: Stairs to enter Entrance Stairs-Number of Steps: 2   Home Layout: One level         Bathroom Toilet: Standard     Home Equipment: Rollator (4 wheels);Grab bars - toilet;Shower seat   Additional Comments: Daughter works nights      Prior Functioning/Environment Prior Level of Function : Patient poor historian/Family not available             Mobility Comments: Per chart, hosuehold AMB without device, earlier in year, used rollator when out of house. Pt reports she walks with a cane. ADLs Comments: Pt reports that she is able to perform dressing, bathing, toileting without assistance; according to chart, however, pt's daughter assists with these ADL.        OT Problem List: Decreased strength;Decreased range of motion;Impaired balance (sitting and/or standing);Decreased activity tolerance;Impaired UE functional use      OT Treatment/Interventions:      OT Goals(Current goals can be  found in the care plan section) Acute Rehab OT Goals Patient Stated Goal: to go home OT Goal Formulation: With patient Time For Goal Achievement: 10/26/22 Potential to Achieve Goals: Good ADL Goals Pt Will Perform Grooming: with supervision;standing Pt Will Transfer to Toilet: with supervision;ambulating;regular height toilet Pt Will Perform Tub/Shower Transfer: with min assist;Stand pivot transfer;shower seat;ambulating  OT Frequency: Min 2X/week    Co-evaluation              AM-PAC OT "6 Clicks" Daily Activity     Outcome Measure Help from another person eating meals?: A Little Help from another person taking care of personal grooming?: A Little Help from another person toileting, which includes using toliet, bedpan, or urinal?: A Lot Help from another person bathing (including washing, rinsing, drying)?: A Lot Help from another person to put on and taking off regular upper body clothing?: A Little Help from another person to put on and taking off regular lower body clothing?: A Lot 6 Click Score: 15   End of  Session    Activity Tolerance: Patient tolerated treatment well Patient left: in bed;with call bell/phone within reach;with bed alarm set  OT Visit Diagnosis: Unsteadiness on feet (R26.81);Other abnormalities of gait and mobility (R26.89);Muscle weakness (generalized) (M62.81);Other symptoms and signs involving cognitive function                Time: 3403-5248 OT Time Calculation (min): 11 min Charges:  OT General Charges $OT Visit: 1 Visit OT Evaluation $OT Eval Moderate Complexity: 1 Mod OT Treatments $Self Care/Home Management : 8-22 mins Josiah Lobo, PhD, MS, OTR/L 10/12/22, 4:17 PM

## 2022-10-12 NOTE — Progress Notes (Signed)
Progress Note   Patient: Alicia Garner YQM:[id number] DOB: May 05, 1941 DOA: 10/11/2022     0 DOS: the patient was seen and examined on 10/12/2022   Brief hospital course: Ms. Alicia Garner is an 82 year old female with history of hypertension, hyperlipidemia, anemia of chronic kidney disease, CKD stage IV, who presents emergency department for chief concerns of left-sided weakness and difficulty walking per daughter.  Initial vitals in the emergency department showed temperature of 98.2, respiration rate of 16, heart rate 95, blood pressure 134/81, SpO2 95% on room air.  Serum sodium is 143, potassium 4.1, chloride 114, bicarb 20, BUN 35, serum creatinine 1.98, EGFR 25, nonfasting blood glucose 118, WBC 6.0, hemoglobin 10.9, platelets of 145.  CT of the head without contrast: Was read as no acute intracranial findings.  Advance chronic microvascular ischemic change and mild cerebral volume loss.  ED treatment: None  1/23: Blood pressure mildly elevated at 164/86.  MRI with acute ischemic nonhemorrhagic posterior right basal ganglia infarct.  Neurology was consulted and patient will complete stroke workup.  MRI also shows age-related cerebellar atrophy with advanced chronic microvascular ischemic disease with innumerable chronic microhemorrhages, most pronounced about the brainstem and deep gray nuclei.  Overall pattern favored to reflect changes of chronic poorly controlled hypertension, although amyloid angiopathy remains a consideration.  Echocardiogram with normal EF and grade 1 diastolic dysfunction, otherwise normal study. Lipid panel normal.  B12 at 302, would like to see above 400, started on p.o. supplement. Also started on Plavix, patient was on aspirin at home.  DAPT for 3 weeks followed by Plavix only. PT is recommending SNF  Assessment and Plan: * CVA (cerebral vascular accident) ([place]) MRI with acute infarct of right posterior basal ganglia. Neurology was consulted-pending  recommendations Completed stroke workup with normal echocardiogram, only showing grade 1 diastolic dysfunction.  A1c of 6.1 and lipid panel normal.  B12 at 302 PT is recommending SNF. -Start her on Plavix as she was already on aspirin-will need DAPT for 3 weeks followed by Plavix only -Continue with statin   Type 2 diabetes mellitus with hyperlipidemia ([name]) A1c of 6.1 - Home Sitagliptin not resumed on admission - Rosuvastatin 20 mg daily resumed - Insulin SSI with at bedtime coverage ordered  Essential hypertension - Home amlodipine 10 mg daily, losartan 50 mg daily, metoprolol succinate 25 mg daily not resumed on admission for permissive hypertension. - Hydralazine 5 mg IV every 3 hours as needed for SBP greater than 180, 4 days ordered  CKD (chronic kidney disease) stage 4, GFR 15-29 ml/min ([name]) - At baseline  Weakness - Fall precautions - B12 at 302, preferably should be above 400 -Starting on B12 supplement -Check vitamin D levels -PT/OT recommending SNF   Subjective: Patient was seen and examined today.  No new deficit.  Continued to feel weak.  Physical Exam: Vitals:   10/12/22 0800 10/12/22 0900 10/12/22 1257 10/12/22 1500  BP:  (!) 146/89 135/75   Pulse: 70 62 100 (!) 126  Resp:  16 17   Temp:  98.4 F (36.9 C) 98.6 F (37 C)   TempSrc:  Oral Oral   SpO2: 97% 98% 96% 100%  Weight:      Height:       General.  Frail elderly lady, in no acute distress. Pulmonary.  Lungs clear bilaterally, normal respiratory effort. CV.  Regular rate and rhythm, no JVD, rub or murmur. Abdomen.  Soft, nontender, nondistended, BS positive. CNS.  Alert and oriented . 4/5 on LLE. Extremities.  No edema, no cyanosis, pulses intact and symmetrical. Psychiatry.  Judgment and insight appears normal.   Data Reviewed: Prior data reviewed.  Family Communication: Discussed with daughter at bedside  Disposition: Status is: Inpatient Remains inpatient appropriate because: Severity  of illness  Planned Discharge Destination: Skilled nursing facility  DVT prophylaxis.  Subcu heparin Time spent: 50 minutes  This record has been created using Systems analyst. Errors have been sought and corrected,but may not always be located. Such creation errors do not reflect on the standard of care.   Author: Lorella Nimrod, MD 10/12/2022 3:53 PM  For on call review [email or web address].

## 2022-10-12 NOTE — Evaluation (Signed)
Physical Therapy Evaluation Patient Details Name: Alicia Garner MRN: [id number] DOB: 1940-12-15 Today's Date: 10/12/2022  History of Present Illness  Kimmberly Wisser is an 79yoF who comes to [facility] on 10/11/22 c acute onset Left sided weakness and difficulty walking. PMH: CKD, DM2, CVA, dementia. MRI brain revealing of "1 cm acute ischemic nonhemorrhagic posterior right basal ganglia  infarct."  Clinical Impression  Pt in bed, calm, cooperative, watching television. Pt unable to give much detail or accuracy in home setup, PLOF, has known dementia with limited ability as historian. Pt follows cues to moving to EOB, ultimately doesn't get very far, is very weak requires heavy modA. Balance at EOB is paired with slight posterior lean with feet floating, but not complete posterior LOB takes place, partial correction with cues. 2 heavy attempts to come to standing, pt following cues, but quickly demotivated by difficulty with strength and imbalance which results in desire to get back in back and rest. Pt previously AMB at house without device, today very weak unable to achieve standing balance, unable to AMB, unable to navigate entry stairs. STR stay at SNF would offer high frequency PT services early in recovery process to maximize full return of function.      Recommendations for follow up therapy are one component of a multi-disciplinary discharge planning process, led by the attending physician.  Recommendations may be updated based on patient status, additional functional criteria and insurance authorization.  Follow Up Recommendations Skilled nursing-short term rehab (<3 hours/day) Can patient physically be transported by private vehicle: No    Assistance Recommended at Discharge Frequent or constant Supervision/Assistance  Patient can return home with the following  Two people to help with walking and/or transfers;A lot of help with bathing/dressing/bathroom;Assist for transportation;Help with stairs or  ramp for entrance;Direct supervision/assist for financial management;Direct supervision/assist for medications management    Equipment Recommendations None recommended by PT (if pt/family refusing placement, extensive DME would be needed to provide total care in the home)  Recommendations for Other Services       Functional Status Assessment Patient has had a recent decline in their functional status and demonstrates the ability to make significant improvements in function in a reasonable and predictable amount of time.     Precautions / Restrictions Precautions Precautions: Fall Restrictions Weight Bearing Restrictions: No      Mobility  Bed Mobility Overal bed mobility: Needs Assistance Bed Mobility: Supine to Sit, Sit to Supine     Supine to sit: Mod assist Sit to supine: Mod assist   General bed mobility comments: total A for scooting up in bed    Transfers Overall transfer level: Needs assistance   Transfers: Sit to/from Stand Sit to Stand: Mod assist           General transfer comment: becomes fearful and axious each time, asks to sit or tries to sit without warning, unable to fully rise to standing    Ambulation/Gait Ambulation/Gait assistance:  (pt unable to AMB)                Stairs Stairs:  (pt unable to stairs)          Wheelchair Mobility    Modified Rankin (Stroke Patients Only)       Balance  Pertinent Vitals/Pain Pain Assessment Pain Assessment: Faces Faces Pain Scale: Hurts even more Pain Location: Rt leg hurts after transfers attempts Pain Intervention(s): Limited activity within patient's tolerance, Monitored during session, Repositioned    Home Living Family/patient expects to be discharged to:: Private residence Living Arrangements: Children Garvin Fila) Available Help at Discharge: Family;Available PRN/intermittently Type of Home: House Home Access: Stairs  to enter   Entrance Stairs-Number of Steps: 2   Home Layout: One level Home Equipment: Rollator (4 wheels);Grab bars - toilet;Shower seat Additional Comments: Daughter works nights    Prior Function Prior Level of Function : Patient poor historian/Family not available             Mobility Comments: Per chart, hosuehold AMB without device, earlier in year, used rollator when out of house.       Hand Dominance   Dominant Hand: Right    Extremity/Trunk Assessment                Communication   Communication: HOH  Cognition Arousal/Alertness: Awake/alert Behavior During Therapy:  (participatory, but totally disoriented) Overall Cognitive Status: History of cognitive impairments - at baseline                                          General Comments      Exercises     Assessment/Plan    PT Assessment Patient needs continued PT services  PT Problem List Decreased strength;Decreased knowledge of use of DME;Decreased range of motion;Decreased safety awareness;Decreased activity tolerance;Decreased knowledge of precautions;Decreased balance;Cardiopulmonary status limiting activity;Decreased mobility;Decreased coordination;Decreased cognition       PT Treatment Interventions DME instruction;Gait training;Stair training;Functional mobility training;Therapeutic activities;Therapeutic exercise;Balance training;Neuromuscular re-education;Cognitive remediation;Patient/family education;Wheelchair mobility training    PT Goals (Current goals can be found in the Care Plan section)  Acute Rehab PT Goals PT Goal Formulation: Patient unable to participate in goal setting    Frequency 7X/week     Co-evaluation               AM-PAC PT "6 Clicks" Mobility  Outcome Measure Help needed turning from your back to your side while in a flat bed without using bedrails?: A Lot Help needed moving from lying on your back to sitting on the side of a flat bed  without using bedrails?: A Lot Help needed moving to and from a bed to a chair (including a wheelchair)?: Total Help needed standing up from a chair using your arms (e.g., wheelchair or bedside chair)?: Total Help needed to walk in hospital room?: Total Help needed climbing 3-5 steps with a railing? : Total 6 Click Score: 8    End of Session   Activity Tolerance: Patient limited by fatigue;Other (comment) (confusion, anxiety, weakness) Patient left: in bed;with bed alarm set (in recliner position, HOB at 45 degrees) Nurse Communication: Mobility status;Need for lift equipment PT Visit Diagnosis: Difficulty in walking, not elsewhere classified (R26.2);Other abnormalities of gait and mobility (R26.89);Muscle weakness (generalized) (M62.81)    Time: 1450-1506 PT Time Calculation (min) (ACUTE ONLY): 16 min   Charges:   PT Evaluation $PT Eval Moderate Complexity: 1 Mod        3:21 PM, 10/12/22 Etta Grandchild, PT, DPT Physical Therapist - [facility]  [phone] ([place])    Tandre Conly C 10/12/2022, 3:17 PM

## 2022-10-12 NOTE — TOC Initial Note (Signed)
Transition of Care [facility]) - Initial/Assessment Note    Patient Details  Name: Alicia Garner MRN: [id number] Date of Birth: 11-Jan-1941  [facility]) CM/SW Contact:    Shelbie Hutching, RN Phone Number: 10/12/2022, 1:47 PM  Clinical Narrative:                 Patient placed under observation for left sided weakness, acute stroke.  RNCM met with patient and her daughter, [name], at the bedside.  Patient lives with daughter, she walks with a walker, Daughter provides all transportation.  Patient would prefer to go home vs SNF.  They have used [facility] in the past and would like to have them again for home health services.  Reached out to [facility] with [facility] and asked if he could accept for PT and OT.    Expected Discharge Plan: [place] Barriers to Discharge: Continued Medical Work up   Patient Goals and CMS Choice Patient states their goals for this hospitalization and ongoing recovery are:: patient wants to go home CMS Medicare.gov Compare Post Acute Care list provided to:: Patient Choice offered to / list presented to : Patient, Adult Children      Expected Discharge Plan and Services   Discharge Planning Services: CM Consult Post Acute Care Choice: [place] arrangements for the past 2 months: Single Family Home                 DME Arranged: N/A DME Agency: NA       HH Arranged: PT, OT HH Agency: [facility] Date [place]: 10/12/22 Time HH Agency Contacted: 1347 Representative spoke with at [place]: Tommi Rumps  Prior Living Arrangements/Services Living arrangements for the past 2 months: [place] Lives with:: Adult Children Patient language and need for interpreter reviewed:: Yes Do you feel safe going back to the place where you live?: Yes      Need for Family Participation in Patient Care: Yes (Comment) Care giver support system in place?: Yes (comment) Current home services: DME (walker and rollator and  bedside commode and shower chair) Criminal Activity/Legal Involvement Pertinent to Current Situation/Hospitalization: No - Comment as needed  Activities of Daily Living Home Assistive Devices/Equipment: Eyeglasses ADL Screening (condition at time of admission) Patient's cognitive ability adequate to safely complete daily activities?: Yes Is the patient deaf or have difficulty hearing?: No Does the patient have difficulty seeing, even when wearing glasses/contacts?: No Does the patient have difficulty concentrating, remembering, or making decisions?: No Patient able to express need for assistance with ADLs?: Yes Does the patient have difficulty dressing or bathing?: Yes Independently performs ADLs?: No Communication: Independent Dressing (OT): Needs assistance Is this a change from baseline?: Pre-admission baseline Grooming: Needs assistance Is this a change from baseline?: Change from baseline, expected to last <3 days Feeding: Independent Bathing: Needs assistance Is this a change from baseline?: Change from baseline, expected to last >3 days Toileting: Needs assistance Is this a change from baseline?: Change from baseline, expected to last >3days In/Out Bed: Needs assistance Is this a change from baseline?: Change from baseline, expected to last >3 days Walks in Home: Needs assistance Is this a change from baseline?: Change from baseline, expected to last >3 days Does the patient have difficulty walking or climbing stairs?: Yes Weakness of Legs: Both Weakness of Arms/Hands: Left  Permission Sought/Granted Permission sought to share information with : Case Manager, Family Supports, Other (comment) Permission granted to share information  with : Yes, Verbal Permission Granted  Share Information with NAME: Rica Mote  Permission granted to share info w AGENCY: [facility]  Permission granted to share info w Relationship: daughter  Permission granted to share info w Contact  Information: [phone]  Emotional Assessment Appearance:: Appears stated age Attitude/Demeanor/Rapport: Engaged Affect (typically observed): Accepting Orientation: : Oriented to Self, Oriented to Place, Oriented to  Time, Oriented to Situation Alcohol / Substance Use: Not Applicable Psych Involvement: No (comment)  Admission diagnosis:  Left-sided weakness [R53.1] Patient Active Problem List   Diagnosis Date Noted   Left-sided weakness 10/11/2022   CKD (chronic kidney disease) stage 4, GFR 15-29 ml/min ([name]) 10/11/2022   Tachycardia 12/07/2021   Constipation 12/07/2021   Weakness 12/06/2021   Anemia of chronic disease 12/05/2021   Acute kidney injury superimposed on CKD ([place]) 12/04/2021   Acute cystitis with hematuria 12/04/2021   Elevated liver function tests 12/04/2021   Pain in joint, multiple sites 12/04/2021   Type 2 diabetes mellitus with hyperlipidemia ([place]) 12/04/2021   Essential hypertension 12/04/2021   DNR (do not resuscitate) 12/04/2021   PCP:  Cl[facility]:   [id number] [place], [address] [place] [address] Phone: [phone] Fax: [phone]     Social Determinants of Health (SDOH) Social History: SDOH Screenings   Food Insecurity: No Food Insecurity (10/12/2022)  Housing: Low Risk  (10/12/2022)  Transportation Needs: No Transportation Needs (10/12/2022)  Utilities: Not At Risk (10/12/2022)  Tobacco Use: Low Risk  (10/11/2022)   SDOH Interventions:     Readmission Risk Interventions    12/05/2021    3:14 PM  Readmission Risk Prevention Plan  Transportation Screening Complete  PCP or Specialist Appt within 5-7 Days Complete  Home Care Screening Complete  Medication Review (RN CM) Complete

## 2022-10-12 NOTE — ED Notes (Signed)
Pt in NAD; pt watching tv.

## 2022-10-12 NOTE — Care Management Obs Status (Signed)
[facility] NOTIFICATION   Patient Details  Name: Alicia Garner MRN: [id number] Date of Birth: [birth year]   Medicare Observation Status Notification Given:  Yes    Shelbie Hutching, RN 10/12/2022, 1:28 PM

## 2022-10-13 DIAGNOSIS — I6339 Cerebral infarction due to thrombosis of other cerebral artery: Secondary | ICD-10-CM | POA: Diagnosis not present

## 2022-10-13 DIAGNOSIS — E785 Hyperlipidemia, unspecified: Secondary | ICD-10-CM

## 2022-10-13 DIAGNOSIS — I10 Essential (primary) hypertension: Secondary | ICD-10-CM | POA: Diagnosis not present

## 2022-10-13 DIAGNOSIS — E1169 Type 2 diabetes mellitus with other specified complication: Secondary | ICD-10-CM

## 2022-10-13 DIAGNOSIS — N184 Chronic kidney disease, stage 4 (severe): Secondary | ICD-10-CM | POA: Diagnosis not present

## 2022-10-13 LAB — GLUCOSE, CAPILLARY
Glucose-Capillary: 96 mg/dL (ref 70–99)
Glucose-Capillary: 95 mg/dL (ref 70–99)

## 2022-10-13 MED ORDER — ROSUVASTATIN CALCIUM 40 MG PO TABS: 40.0000 mg | ORAL_TABLET | Freq: Every day | ORAL | 1 refills | Status: DC

## 2022-10-13 MED ORDER — CLOPIDOGREL BISULFATE 75 MG PO TABS: 75.0000 mg | ORAL_TABLET | Freq: Every day | ORAL | 1 refills | Status: DC

## 2022-10-13 MED ORDER — CYANOCOBALAMIN 1000 MCG PO TABS: 1000.0000 ug | ORAL_TABLET | Freq: Every day | ORAL | 1 refills | Status: AC

## 2022-10-13 MED ORDER — LOSARTAN POTASSIUM 50 MG PO TABS: 50.0000 mg | ORAL_TABLET | Freq: Every day | ORAL | Status: DC

## 2022-10-13 MED ORDER — AMLODIPINE BESYLATE 5 MG PO TABS
5.0000 mg | ORAL_TABLET | Freq: Every day | ORAL | Status: DC
  Administered 2022-10-13: 5 mg via ORAL
  Filled 2022-10-13: qty 1

## 2022-10-13 NOTE — Discharge Summary (Signed)
Physician Discharge Summary   Patient: Alicia Garner MRN: [id number] DOB: 06-10-1941  Admit date:     10/11/2022  Discharge date: 10/13/22  Discharge Physician: Lorella Nimrod   PCP: Clinic, [facility]   Recommendations at discharge:  Please obtain CBC and BMP in 1 week Follow-up with primary care provider Follow-up with outpatient neurology for stroke follow-up  Discharge Diagnoses: Principal Problem:   CVA (cerebral vascular accident) [facility]) Active Problems:   Type 2 diabetes mellitus with hyperlipidemia ([place])   Essential hypertension   CKD (chronic kidney disease) stage 4, GFR 15-29 ml/min ([place])   Weakness   Hospital Course: Ms. Alicia Garner is an 82 year old female with history of hypertension, hyperlipidemia, anemia of chronic kidney disease, CKD stage IV, who presents emergency department for chief concerns of left-sided weakness and difficulty walking per daughter.  Initial vitals in the emergency department showed temperature of 98.2, respiration rate of 16, heart rate 95, blood pressure 134/81, SpO2 95% on room air.  Serum sodium is 143, potassium 4.1, chloride 114, bicarb 20, BUN 35, serum creatinine 1.98, EGFR 25, nonfasting blood glucose 118, WBC 6.0, hemoglobin 10.9, platelets of 145.  CT of the head without contrast: Was read as no acute intracranial findings.  Advance chronic microvascular ischemic change and mild cerebral volume loss.  ED treatment: None  1/23: Blood pressure mildly elevated at 164/86.  MRI with acute ischemic nonhemorrhagic posterior right basal ganglia infarct.  Neurology was consulted and patient will complete stroke workup.  MRI also shows age-related cerebellar atrophy with advanced chronic microvascular ischemic disease with innumerable chronic microhemorrhages, most pronounced about the brainstem and deep gray nuclei.  Overall pattern favored to reflect changes of chronic poorly controlled hypertension, although amyloid angiopathy remains a  consideration.  Echocardiogram with normal EF and grade 1 diastolic dysfunction, otherwise normal study. Lipid panel normal.  B12 at 302, would like to see above 400, started on p.o. supplement. Also started on Plavix, patient was on aspirin at home.  DAPT for 3 weeks followed by Plavix only. PT is recommending SNF.  1/24: Hemodynamically stable, mildly elevated blood pressure at 142/74, Crestor dose was increased to 40 by neurology.  We can slowly add her antihypertensives to normalize her blood pressure over the next week.  Adding home amlodipine at a lower dose of 5 mg daily.  Patient was seen and examined today.  No new complaints.  She was insisting on going home.  Daughter agrees to provide extra help along with home health services which were ordered.  She does not want to go to a rehab facility.  Maximum home health services ordered.  She is being discharged on DAPT with aspirin and Plavix for 3 weeks followed by Plavix only.  Patient need to see outpatient neurology for her stroke workup.  Patient was instructed to hold lisinopril for 3 days and restart amlodipine from tomorrow.  Patient has to follow-up with outpatient neurology for stroke follow-up.  Patient will continue with the rest of her home medications and need to have a close follow-up with providers for further management.  Assessment and Plan: * CVA (cerebral vascular accident) ([place]) MRI with acute infarct of right posterior basal ganglia. Neurology was consulted-pending recommendations Completed stroke workup with normal echocardiogram, only showing grade 1 diastolic dysfunction.  A1c of 6.1 and lipid panel normal.  B12 at 302 PT is recommending SNF. -Start her on Plavix as she was already on aspirin-will need DAPT for 3 weeks followed by Plavix only -Continue with statin  Type 2 diabetes mellitus with hyperlipidemia ([name]) A1c of 6.1 - Home Sitagliptin not resumed on admission - Rosuvastatin 20 mg daily resumed -  Insulin SSI with at bedtime coverage ordered  Essential hypertension - Home amlodipine 10 mg daily, losartan 50 mg daily, metoprolol succinate 25 mg daily not resumed on admission for permissive hypertension. - Hydralazine 5 mg IV every 3 hours as needed for SBP greater than 180, 4 days ordered  CKD (chronic kidney disease) stage 4, GFR 15-29 ml/min ([name]) - At baseline  Weakness - Fall precautions - B12 at 302, preferably should be above 400 -Starting on B12 supplement -Check vitamin D levels -PT/OT recommending SNF   Consultants: Neurology Procedures performed: None Disposition: Home health Diet recommendation:  Discharge Diet Orders (From admission, onward)     Start     Ordered   10/13/22 0000  Diet - low sodium heart healthy        10/13/22 1050           Cardiac and Carb modified diet DISCHARGE MEDICATION: Allergies as of 10/13/2022       Reactions   Lisinopril Swelling        Medication List     STOP taking these medications    metoprolol succinate 25 MG 24 hr tablet Commonly known as: TOPROL-XL   predniSONE 5 MG tablet Commonly known as: DELTASONE   sodium bicarbonate 650 MG tablet       TAKE these medications    amLODipine 10 MG tablet Commonly known as: NORVASC Take 10 mg by mouth daily.   aspirin EC 81 MG tablet Take 81 mg by mouth daily. Swallow whole.   Cholecalciferol 25 MCG (1000 UT) capsule Take by mouth.   clopidogrel 75 MG tablet Commonly known as: PLAVIX Take 1 tablet (75 mg total) by mouth daily. Start taking on: October 14, 2022   colchicine 0.6 MG tablet Take 0.5 tablets (0.3 mg total) by mouth every 3 (three) days.   cyanocobalamin 1000 MCG tablet Take 1 tablet (1,000 mcg total) by mouth daily. Start taking on: October 14, 2022   diclofenac Sodium 1 % Gel Commonly known as: VOLTAREN Apply 2 g topically 4 (four) times daily.   losartan 50 MG tablet Commonly known as: COZAAR Take 1 tablet (50 mg total) by mouth  daily. Hold for 3 days. What changed: additional instructions   polyethylene glycol 17 g packet Commonly known as: MIRALAX / GLYCOLAX Take 17 g by mouth daily as needed for moderate constipation.   rosuvastatin 40 MG tablet Commonly known as: CRESTOR Take 1 tablet (40 mg total) by mouth daily. Start taking on: October 14, 2022 What changed:  medication strength how much to take   sitaGLIPtin 25 MG tablet Commonly known as: JANUVIA Take 1 tablet (25 mg total) by mouth once daily For diabetes        Follow-up Information     [facility]. Schedule an appointment as soon as possible for a visit in 1 week(s).   Contact information: [place] [address] [phone]                Discharge Exam: Filed Weights   10/11/22 1459  Weight: 72.6 kg   General.     In no acute distress. Pulmonary.  Lungs clear bilaterally, normal respiratory effort. CV.  Regular rate and rhythm, no JVD, rub or murmur. Abdomen.  Soft, nontender, nondistended, BS positive. CNS.  Alert and oriented .  No focal neurologic deficit. Extremities.  No edema, no cyanosis, pulses intact and symmetrical. Psychiatry.  Judgment and insight appears normal.   Condition at discharge: stable  The results of significant diagnostics from this hospitalization (including imaging, microbiology, ancillary and laboratory) are listed below for reference.   Imaging Studies: US Carotid Bilateral  Result Date: 10/12/2022 CLINICAL DATA:  Initial evaluation for acute stroke. EXAM: BILATERAL CAROTID DUPLEX ULTRASOUND TECHNIQUE: Pearline Cables scale imaging, color Doppler and duplex ultrasound were performed of bilateral carotid and vertebral arteries in the neck. COMPARISON:  Prior brain MRI from 10/11/2022. FINDINGS: Criteria: Quantification of carotid stenosis is based on velocity parameters that correlate the residual internal carotid diameter with NASCET-based stenosis levels, using the  diameter of the distal internal carotid lumen as the denominator for stenosis measurement. The following velocity measurements were obtained: RIGHT ICA: 70/19 cm/sec CCA: 62 cm/sec SYSTOLIC ICA/CCA RATIO:  1.1 ECA: 106 cm/sec LEFT ICA: 82/27 cm/sec CCA: 57 cm/sec SYSTOLIC ICA/CCA RATIO:  1.4 ECA: 117 cm/sec RIGHT CAROTID ARTERY: Visualized right CCA is tortuous with circumferential intimal thickening. Minor atheromatous irregularity about the right carotid bulb. No elevation in peak systolic velocity to suggest hemodynamically significant stenosis. Visualized right ICA tortuous but patent with antegrade flow. RIGHT VERTEBRAL ARTERY:  Patent with antegrade flow. LEFT CAROTID ARTERY: Visualized left CCA is tortuous with circumferential wall thickening. Moderate heterogeneous echogenic plaque about the left carotid bulb. No significant elevation in peak systolic velocity to suggest hemodynamically significant greater than 50% stenosis. Visualized left ICA tortuous but patent with antegrade flow. LEFT VERTEBRAL ARTERY:  Patent with antegrade flow. IMPRESSION: 1. Mild-to-moderate atheromatous plaque about the carotid bifurcations, left greater than right, but without hemodynamically significant greater than 50% stenosis by sonography. 2. Patent antegrade flow within the vertebral arteries bilaterally. 3. Diffuse tortuosity of the major arterial vasculature of the neck, most commonly seen in the setting of chronic underlying hypertension. Electronically Signed   By: Jeannine Boga M.D.   On: 10/12/2022 17:31   ECHOCARDIOGRAM COMPLETE  Result Date: 10/12/2022    ECHOCARDIOGRAM REPORT   Patient Name:   BREANA LITTS Date of Exam: 10/12/2022 Medical Rec #:  [id number]   Height:       64.0 in Accession #:    [id number]  Weight:       160.1 lb Date of Birth:  1940/10/25    BSA:          1.780 m Patient Age:    40 years    BP:           146/89 mmHg Patient Gender: F           HR:           71 bpm. Exam Location:  [facility]  Procedure: 2D Echo Indications:     stroke  History:         Patient has no prior history of Echocardiogram examinations.                  Arrythmias:Tachycardia; Risk Factors:Diabetes and Hypertension.  Sonographer:     Harvie Junior Referring Phys:  [id number] Marquiz Sotelo Diagnosing Phys: Kathlyn Sacramento MD  Sonographer Comments: Technically difficult study due to poor echo windows. Image acquisition challenging due to patient body habitus and supine. IMPRESSIONS  1. Left ventricular ejection fraction, by estimation, is 55 to 60%. The left ventricle has normal function. The left ventricle has no regional wall motion abnormalities. There is mild left ventricular hypertrophy. Left ventricular diastolic parameters are consistent  with Grade I diastolic dysfunction (impaired relaxation).  2. Right ventricular systolic function is normal. The right ventricular size is normal. Tricuspid regurgitation signal is inadequate for assessing PA pressure.  3. The mitral valve is normal in structure. No evidence of mitral valve regurgitation. No evidence of mitral stenosis.  4. The aortic valve is normal in structure. Aortic valve regurgitation is not visualized. No aortic stenosis is present.  5. The inferior vena cava is normal in size with greater than 50% respiratory variability, suggesting right atrial pressure of 3 mmHg. FINDINGS  Left Ventricle: Left ventricular ejection fraction, by estimation, is 55 to 60%. The left ventricle has normal function. The left ventricle has no regional wall motion abnormalities. The left ventricular internal cavity size was normal in size. There is  mild left ventricular hypertrophy. Left ventricular diastolic parameters are consistent with Grade I diastolic dysfunction (impaired relaxation). Right Ventricle: The right ventricular size is normal. No increase in right ventricular wall thickness. Right ventricular systolic function is normal. Tricuspid regurgitation signal is inadequate for  assessing PA pressure. The tricuspid regurgitant velocity is 2.15 m/s, and with an assumed right atrial pressure of 3 mmHg, the estimated right ventricular systolic pressure is 11.9 mmHg. Left Atrium: Left atrial size was normal in size. Right Atrium: Right atrial size was normal in size. Pericardium: There is no evidence of pericardial effusion. Mitral Valve: The mitral valve is normal in structure. No evidence of mitral valve regurgitation. No evidence of mitral valve stenosis. Tricuspid Valve: The tricuspid valve is normal in structure. Tricuspid valve regurgitation is not demonstrated. No evidence of tricuspid stenosis. Aortic Valve: The aortic valve is normal in structure. Aortic valve regurgitation is not visualized. No aortic stenosis is present. Aortic valve mean gradient measures 4.0 mmHg. Aortic valve peak gradient measures 7.0 mmHg. Aortic valve area, by VTI measures 2.71 cm. Pulmonic Valve: The pulmonic valve was normal in structure. Pulmonic valve regurgitation is not visualized. No evidence of pulmonic stenosis. Aorta: The aortic root is normal in size and structure. Venous: The inferior vena cava is normal in size with greater than 50% respiratory variability, suggesting right atrial pressure of 3 mmHg. IAS/Shunts: No atrial level shunt detected by color flow Doppler.  LEFT VENTRICLE PLAX 2D LVIDd:         3.90 cm      Diastology LVIDs:         2.70 cm      LV e' medial:    9.79 cm/s LV PW:         1.10 cm      LV E/e' medial:  4.9 LV IVS:        1.10 cm      LV e' lateral:   6.53 cm/s LVOT diam:     2.10 cm      LV E/e' lateral: 7.4 LV SV:         70 LV SV Index:   39 LVOT Area:     3.46 cm  LV Volumes (MOD) LV vol d, MOD A2C: 42.7 ml LV vol d, MOD A4C: 107.0 ml LV vol s, MOD A2C: 16.0 ml LV vol s, MOD A4C: 42.8 ml LV SV MOD A2C:     26.7 ml LV SV MOD A4C:     107.0 ml LV SV MOD BP:      42.4 ml RIGHT VENTRICLE RV Basal diam:  3.30 cm RV Mid diam:    2.90 cm RV S prime:     13.10  cm/s TAPSE  (M-mode): 1.4 cm LEFT ATRIUM             Index        RIGHT ATRIUM           Index LA diam:        3.50 cm 1.97 cm/m   RA Area:     12.60 cm LA Vol (A2C):   26.7 ml 15.00 ml/m  RA Volume:   28.30 ml  15.90 ml/m LA Vol (A4C):   34.1 ml 19.16 ml/m LA Biplane Vol: 31.5 ml 17.70 ml/m  AORTIC VALVE                    PULMONIC VALVE AV Area (Vmax):    2.93 cm     PV Vmax:       1.02 m/s AV Area (Vmean):   2.78 cm     PV Peak grad:  4.2 mmHg AV Area (VTI):     2.71 cm AV Vmax:           132.00 cm/s AV Vmean:          90.600 cm/s AV VTI:            0.257 m AV Peak Grad:      7.0 mmHg AV Mean Grad:      4.0 mmHg LVOT Vmax:         111.50 cm/s LVOT Vmean:        72.600 cm/s LVOT VTI:          0.201 m LVOT/AV VTI ratio: 0.78  AORTA Ao Root diam: 3.60 cm MITRAL VALVE               TRICUSPID VALVE MV Area (PHT): 3.19 cm    TR Peak grad:   18.5 mmHg MV Decel Time: 238 msec    TR Vmax:        215.00 cm/s MR Peak grad: 24.5 mmHg MR Vmax:      247.33 cm/s  SHUNTS MV E velocity: 48.30 cm/s  Systemic VTI:  0.20 m MV A velocity: 85.10 cm/s  Systemic Diam: 2.10 cm MV E/A ratio:  0.57 Kathlyn Sacramento MD Electronically signed by Kathlyn Sacramento MD Signature Date/Time: 10/12/2022/1:32:19 PM    Final    MR BRAIN WO CONTRAST  Result Date: 10/11/2022 CLINICAL DATA:  Initial evaluation for neuro deficit, stroke suspected. EXAM: MRI HEAD WITHOUT CONTRAST TECHNIQUE: Multiplanar, multiecho pulse sequences of the brain and surrounding structures were obtained without intravenous contrast. COMPARISON:  Prior CT from earlier the same day as well as previous MRI from 09/08/2021. FINDINGS: Brain: Generalized age-related cerebral atrophy. Extensive patchy and confluent T2/FLAIR hyperintensity involving the periventricular and deep white matter both cerebral hemispheres, most consistent with chronic microvascular ischemic disease, advanced in nature. Patchy involvement of the deep gray nuclei and pons noted. Well layering degeneration about  the medullary pyramids noted. 1 cm focus of restricted diffusion involving the posterior right basal ganglia, consistent with an acute ischemic infarct (series 5, image 29). No associated mass effect or definite hemorrhage. No other evidence for acute or subacute ischemia. No areas of chronic cortical infarction. No acute intracranial hemorrhage. Innumerable chronic micro hemorrhages again noted, most pronounced about the brainstem and deep gray nuclei. Overall pattern favored to reflect changes of chronic poorly controlled hypertension, although amyloid angiopathy remains a consideration. No mass lesion, midline shift or mass effect. No hydrocephalus or extra-axial fluid collection. Pituitary gland and suprasellar region within normal  limits. Vascular: Major intracranial vascular flow voids are maintained. Skull and upper cervical spine: Craniocervical junction within normal limits. Bone marrow signal intensity normal. No scalp soft tissue abnormality. Sinuses/Orbits: Prior bilateral ocular lens replacement. Mild scattered mucosal thickening noted about the ethmoidal air cells and maxillary sinuses. No mastoid effusion. Other: None. IMPRESSION: 1. 1 cm acute ischemic nonhemorrhagic posterior right basal ganglia infarct. 2. Underlying age-related cerebral atrophy with advanced chronic microvascular ischemic disease. 3. Innumerable chronic micro hemorrhages, most pronounced about the brainstem and deep gray nuclei. Overall pattern favored to reflect changes of chronic poorly controlled hypertension, although amyloid angiopathy remains a consideration. Electronically Signed   By: Jeannine Boga M.D.   On: 10/11/2022 21:13   DG Chest Port 1 View  Result Date: 10/11/2022 CLINICAL DATA:  Dyspnea on exertion EXAM: PORTABLE CHEST 1 VIEW COMPARISON:  12/08/2021 FINDINGS: The heart size and mediastinal contours are within normal limits. Both lungs are clear. The visualized skeletal structures are unremarkable.  IMPRESSION: No active disease. Electronically Signed   By: Donavan Foil M.D.   On: 10/11/2022 19:00   CT HEAD WO CONTRAST  Result Date: 10/11/2022 CLINICAL DATA:  Neuro deficit, acute, stroke suspected EXAM: CT HEAD WITHOUT CONTRAST TECHNIQUE: Contiguous axial images were obtained from the base of the skull through the vertex without intravenous contrast. RADIATION DOSE REDUCTION: This exam was performed according to the departmental dose-optimization program which includes automated exposure control, adjustment of the mA and/or kV according to patient size and/or use of iterative reconstruction technique. COMPARISON:  09/13/2021 FINDINGS: Brain: No evidence of acute infarction, hemorrhage, hydrocephalus, extra-axial collection or mass lesion/mass effect. Extensive low-density changes within the periventricular and subcortical white matter compatible with chronic microvascular ischemic change. Mild diffuse cerebral volume loss. Vascular: Atherosclerotic calcifications involving the large vessels of the skull base. No unexpected hyperdense vessel. Skull: Normal. Negative for fracture or focal lesion. Sinuses/Orbits: No acute finding. Other: None. IMPRESSION: 1. No acute intracranial findings. 2. Advanced chronic microvascular ischemic change and mild cerebral volume loss. Electronically Signed   By: Davina Poke D.O.   On: 10/11/2022 12:59    Microbiology: Results for orders placed or performed during the hospital encounter of 12/04/21  Urine Culture     Status: Abnormal   Collection Time: 12/04/21  1:51 PM   Specimen: In/Out Cath Urine  Result Value Ref Range Status   Specimen Description   Final    IN/OUT CATH URINE Performed at [facility], [address], [place], [place] [address]    Special Requests   Final    NONE Performed at [facility], [address] [place], [place] [address]    Culture 70,000 COLONIES/mL STREPTOCOCCUS ANGINOSIS (A)  Final   Report Status  12/06/2021 FINAL  Final    Labs: CBC: Recent Labs  Lab 10/11/22 1207  WBC 6.0  NEUTROABS 3.2  HGB 10.9*  HCT 34.2*  MCV 95.0  PLT 759*   Basic Metabolic Panel: Recent Labs  Lab 10/11/22 1207  NA 143  K 4.1  CL 114*  CO2 20*  GLUCOSE 118*  BUN 35*  CREATININE 1.98*  CALCIUM 8.9   Liver Function Tests: Recent Labs  Lab 10/11/22 1207  AST 17  ALT 11  ALKPHOS 61  BILITOT 0.5  PROT 7.6  ALBUMIN 3.5   CBG: Recent Labs  Lab 10/12/22 0824 10/12/22 1152 10/12/22 1622 10/12/22 2016 10/13/22 0738  GLUCAP 85 179* 86 100* 96    Discharge time spent: greater than 30  minutes.  This record has been created using Systems analyst. Errors have been sought and corrected,but may not always be located. Such creation errors do not reflect on the standard of care.   Signed: Lorella Nimrod, MD [facility] 10/13/2022

## 2022-10-13 NOTE — Plan of Care (Signed)
Patient ID: Maylene Crocker, female   DOB: 06-Feb-1941, 82 y.o.   MRN: [id number]   Problem: Education: Goal: Knowledge of disease or condition will improve Outcome: Adequate for Discharge Goal: Knowledge of secondary prevention will improve (MUST DOCUMENT ALL) Outcome: Adequate for Discharge Goal: Knowledge of patient specific risk factors will improve Elta [place] N/A or DELETE if not current risk factor) Outcome: Adequate for Discharge   Problem: Ischemic Stroke/TIA Tissue Perfusion: Goal: Complications of ischemic stroke/TIA will be minimized Outcome: Adequate for Discharge   Problem: Coping: Goal: Will verbalize positive feelings about self Outcome: Adequate for Discharge Goal: Will identify appropriate support needs Outcome: Adequate for Discharge   Problem: Health Behavior/Discharge Planning: Goal: Ability to manage health-related needs will improve Outcome: Adequate for Discharge Goal: Goals will be collaboratively established with patient/family Outcome: Adequate for Discharge   Problem: Self-Care: Goal: Ability to participate in self-care as condition permits will improve Outcome: Adequate for Discharge Goal: Verbalization of feelings and concerns over difficulty with self-care will improve Outcome: Adequate for Discharge Goal: Ability to communicate needs accurately will improve Outcome: Adequate for Discharge   Problem: Nutrition: Goal: Risk of aspiration will decrease Outcome: Adequate for Discharge Goal: Dietary intake will improve Outcome: Adequate for Discharge   Problem: Education: Goal: Ability to describe self-care measures that may prevent or decrease complications (Diabetes Survival Skills Education) will improve Outcome: Adequate for Discharge Goal: Individualized Educational Video(s) Outcome: Adequate for Discharge   Problem: Coping: Goal: Ability to adjust to condition or change in health will improve Outcome: Adequate for Discharge   Problem: Fluid  Volume: Goal: Ability to maintain a balanced intake and output will improve Outcome: Adequate for Discharge   Problem: Health Behavior/Discharge Planning: Goal: Ability to identify and utilize available resources and services will improve Outcome: Adequate for Discharge Goal: Ability to manage health-related needs will improve Outcome: Adequate for Discharge   Problem: Metabolic: Goal: Ability to maintain appropriate glucose levels will improve Outcome: Adequate for Discharge   Problem: Nutritional: Goal: Maintenance of adequate nutrition will improve Outcome: Adequate for Discharge Goal: Progress toward achieving an optimal weight will improve Outcome: Adequate for Discharge   Problem: Skin Integrity: Goal: Risk for impaired skin integrity will decrease Outcome: Adequate for Discharge   Problem: Tissue Perfusion: Goal: Adequacy of tissue perfusion will improve Outcome: Adequate for Discharge   Problem: Acute Rehab PT Goals(only PT should resolve) Goal: Patient Will Transfer Sit To/From Stand Outcome: Adequate for Discharge Goal: Pt Will Transfer Bed To Chair/Chair To Bed Outcome: Adequate for Discharge Goal: Pt Will Ambulate Outcome: Adequate for Discharge Goal: Pt Will Go Up/Down Stairs Outcome: Adequate for Discharge   Problem: Acute Rehab OT Goals (only OT should resolve) Goal: Pt. Will Perform Grooming Outcome: Adequate for Discharge Goal: Pt. Will Transfer To Toilet Outcome: Adequate for Discharge Goal: Pt. Will Perform Tub/Shower Transfer Outcome: Adequate for Discharge   Problem: Education: Goal: Knowledge of General Education information will improve Description: Including pain rating scale, medication(s)/side effects and non-pharmacologic comfort measures Outcome: Adequate for Discharge   Problem: Health Behavior/Discharge Planning: Goal: Ability to manage health-related needs will improve Outcome: Adequate for Discharge   Problem: Clinical  Measurements: Goal: Ability to maintain clinical measurements within normal limits will improve Outcome: Adequate for Discharge Goal: Will remain free from infection Outcome: Adequate for Discharge Goal: Diagnostic test results will improve Outcome: Adequate for Discharge Goal: Respiratory complications will improve Outcome: Adequate for Discharge Goal: Cardiovascular complication will be avoided Outcome: Adequate for Discharge   Problem: Activity: Goal:  Risk for activity intolerance will decrease Outcome: Adequate for Discharge   Problem: Nutrition: Goal: Adequate nutrition will be maintained Outcome: Adequate for Discharge   Problem: Coping: Goal: Level of anxiety will decrease Outcome: Adequate for Discharge   Problem: Elimination: Goal: Will not experience complications related to bowel motility Outcome: Adequate for Discharge Goal: Will not experience complications related to urinary retention Outcome: Adequate for Discharge   Problem: Pain Managment: Goal: General experience of comfort will improve Outcome: Adequate for Discharge   Problem: Safety: Goal: Ability to remain free from injury will improve Outcome: Adequate for Discharge   Problem: Skin Integrity: Goal: Risk for impaired skin integrity will decrease Outcome: Adequate for Discharge    Haydee Salter, RN

## 2022-10-13 NOTE — Progress Notes (Signed)
Patient ID: Alicia Garner, female   DOB: 11/20/1940, 82 y.o.   MRN: [id number]  An After Visit Summary was printed and given to the patient.   Patient education given on medication changes and follow up appointments and the patient  and her daughter, [name] express understanding and acceptance of instructions.   Volunteers to take to front lobby and daughter to drive home.   Haydee Salter 10/13/2022 12:41 PM

## 2022-10-13 NOTE — TOC Transition Note (Addendum)
Transition of Care [facility]) - CM/SW Discharge Note   Patient Details  Name: Alicia Garner MRN: [id number] Date of Birth: 1940/11/21  [facility] CM/SW Contact:  Quin Hoop, LCSW Phone Number: 10/13/2022, 11:08 AM   Clinical Narrative:    Pt declined SNF.  Discharging home with [facility] OT/PT/RN/Aide.  HH provided by [facility], discussed with Tommi Rumps.  Pts dtr to transport her home by personal vehicle.    Pt has following DME at home per dtr, [name]:  RW, Shower chair, BSC).  No other DME needs.   Final next level of care: Home w Home Health Services Barriers to Discharge: No Barriers Identified   Patient Goals and CMS Choice CMS Medicare.gov Compare Post Acute Care list provided to:: Patient Choice offered to / list presented to : Patient, Adult Children  Discharge Placement                    Name of family member notified: Minus Breeding, [phone]    Discharge Plan and Services Additional resources added to the After Visit Summary for     Discharge Planning Services: CM Consult Post Acute Care Choice: Home Health          DME Arranged: N/A DME Agency: NA       HH Arranged: RN, PT, OT, Nurse's Aide [place] Agency: [place] Date [facility] Agency Contacted: 10/13/22 Time HH Agency Contacted: 1347 Representative spoke with at [place]: [place] Determinants of Health ([place]) Interventions SDOH Screenings   Food Insecurity: No Food Insecurity (10/12/2022)  Housing: Low Risk  (10/12/2022)  Transportation Needs: No Transportation Needs (10/12/2022)  Utilities: Not At Risk (10/12/2022)  Tobacco Use: Low Risk  (10/11/2022)     Readmission Risk Interventions    12/05/2021    3:14 PM  Readmission Risk Prevention Plan  Transportation Screening Complete  PCP or Specialist Appt within 5-7 Days Complete  Home Care Screening Complete  Medication Review (RN CM) Complete

## 2022-10-14 ENCOUNTER — Emergency Department
Admission: EM | Admit: 2022-10-14 | Discharge: 2022-10-14 | Disposition: A | Discharge status: Home / Self Care | Payer: Medicare HMO | Attending: Emergency Medicine | Admitting: Emergency Medicine

## 2022-10-14 ENCOUNTER — Emergency Department: Payer: Medicare HMO

## 2022-10-14 DIAGNOSIS — Z1152 Encounter for screening for COVID-19: Secondary | ICD-10-CM | POA: Diagnosis not present

## 2022-10-14 DIAGNOSIS — I6782 Cerebral ischemia: Secondary | ICD-10-CM | POA: Diagnosis not present

## 2022-10-14 DIAGNOSIS — Z8673 Personal history of transient ischemic attack (TIA), and cerebral infarction without residual deficits: Secondary | ICD-10-CM | POA: Insufficient documentation

## 2022-10-14 DIAGNOSIS — R42 Dizziness and giddiness: Secondary | ICD-10-CM | POA: Diagnosis not present

## 2022-10-14 DIAGNOSIS — R531 Weakness: Secondary | ICD-10-CM | POA: Diagnosis not present

## 2022-10-14 DIAGNOSIS — F0390 Unspecified dementia without behavioral disturbance: Secondary | ICD-10-CM | POA: Insufficient documentation

## 2022-10-14 DIAGNOSIS — R410 Disorientation, unspecified: Secondary | ICD-10-CM | POA: Diagnosis not present

## 2022-10-14 DIAGNOSIS — E860 Dehydration: Secondary | ICD-10-CM | POA: Insufficient documentation

## 2022-10-14 DIAGNOSIS — G459 Transient cerebral ischemic attack, unspecified: Secondary | ICD-10-CM | POA: Diagnosis not present

## 2022-10-14 DIAGNOSIS — R29898 Other symptoms and signs involving the musculoskeletal system: Secondary | ICD-10-CM | POA: Diagnosis not present

## 2022-10-14 LAB — URINALYSIS, ROUTINE W REFLEX MICROSCOPIC
Bilirubin Urine: NEGATIVE
Glucose, UA: NEGATIVE mg/dL
Ketones, ur: NEGATIVE mg/dL
Leukocytes,Ua: NEGATIVE
Nitrite: NEGATIVE
Protein, ur: >=300 mg/dL — AB
Specific Gravity, Urine: 1.015 (ref 1.005–1.030)
pH: 5 (ref 5.0–8.0)

## 2022-10-14 LAB — COMPREHENSIVE METABOLIC PANEL
ALT: 11 U/L (ref 0–44)
AST: 22 U/L (ref 15–41)
Albumin: 3.3 g/dL — ABNORMAL LOW (ref 3.5–5.0)
Alkaline Phosphatase: 56 U/L (ref 38–126)
Anion gap: 6 (ref 5–15)
BUN: 34 mg/dL — ABNORMAL HIGH (ref 8–23)
CO2: 23 mmol/L (ref 22–32)
Calcium: 8.9 mg/dL (ref 8.9–10.3)
Chloride: 109 mmol/L (ref 98–111)
Creatinine, Ser: 2.43 mg/dL — ABNORMAL HIGH (ref 0.44–1.00)
GFR, Estimated: 20 mL/min — ABNORMAL LOW (ref >60)
Glucose, Bld: 138 mg/dL — ABNORMAL HIGH (ref 70–99)
Potassium: 4.5 mmol/L (ref 3.5–5.1)
Sodium: 138 mmol/L (ref 135–145)
Total Bilirubin: 0.9 mg/dL (ref 0.3–1.2)
Total Protein: 7.8 g/dL (ref 6.5–8.1)

## 2022-10-14 LAB — RESP PANEL BY RT-PCR (RSV, FLU A&B, COVID)  RVPGX2
Influenza A by PCR: NEGATIVE
Influenza B by PCR: NEGATIVE
Resp Syncytial Virus by PCR: NEGATIVE
SARS Coronavirus 2 by RT PCR: NEGATIVE

## 2022-10-14 LAB — VITAMIN D 25 HYDROXY (VIT D DEFICIENCY, FRACTURES)

## 2022-10-14 LAB — CBC
HCT: 35.9 % — ABNORMAL LOW (ref 36.0–46.0)
Hemoglobin: 11.4 g/dL — ABNORMAL LOW (ref 12.0–15.0)
MCH: 29.9 pg (ref 26.0–34.0)
MCHC: 31.8 g/dL (ref 30.0–36.0)
MCV: 94.2 fL (ref 80.0–100.0)
Platelets: 153 10*3/uL (ref 150–400)
RBC: 3.81 MIL/uL — ABNORMAL LOW (ref 3.87–5.11)
RDW: 15.2 % (ref 11.5–15.5)
WBC: 8.4 10*3/uL (ref 4.0–10.5)
nRBC: 0 % (ref 0.0–0.2)

## 2022-10-14 LAB — CBG MONITORING, ED: Glucose-Capillary: 124 mg/dL — ABNORMAL HIGH (ref 70–99)

## 2022-10-14 NOTE — ED Notes (Signed)
Dr. Jacqualine Code at bedside to assess patient for possible code stroke activation

## 2022-10-14 NOTE — ED Notes (Signed)
No Code stroke per Dr. Jacqualine Code, see orders placed via verbal orders

## 2022-10-14 NOTE — ED Provider Notes (Signed)
[facility] Provider Note   Event Date/Time   First MD Initiated Contact with Patient 10/14/22 1203     (approximate) History  Dizziness  HPI Alicia Garner is a 82 y.o. female with a past medical history of dementia who presents via EMS after onset of lightheadedness.  Patient was released yesterday from workup for a stroke and has residual right-sided weakness and inability to stand or walk since the stroke.  Patient states that she has had decreased p.o. intake over the last 3 days ROS: Patient currently denies any vision changes, tinnitus, difficulty speaking, facial droop, sore throat, chest pain, shortness of breath, abdominal pain, nausea/vomiting/diarrhea, dysuria, or weakness/numbness/paresthesias in any extremity   Physical Exam  Triage Vital Signs: ED Triage Vitals [10/14/22 1033]  Enc Vitals Group     BP 129/82     Pulse Rate 89     Resp 18     Temp 99.5 F (37.5 C)     Temp src      SpO2 96 %     Weight      Height      Head Circumference      Peak Flow      Pain Score      Pain Loc      Pain Edu?      Excl. in [place]?    Most recent vital signs: Vitals:   10/14/22 1430 10/14/22 1450  BP: (!) 149/81   Pulse: 81   Resp: (!) 23   Temp:  98.9 F (37.2 C)  SpO2: 100%    General: Awake, oriented x4. CV:  Good peripheral perfusion.  Resp:  Normal effort.  Abd:  No distention.  Other:  Elderly overweight African-American female laying in bed in no acute distress ED Results / Procedures / Treatments  Labs (all labs ordered are listed, but only abnormal results are displayed) Labs Reviewed  COMPREHENSIVE METABOLIC PANEL - Abnormal; Notable for the following components:      Result Value   Glucose, Bld 138 (*)    BUN 34 (*)    Creatinine, Ser 2.43 (*)    Albumin 3.3 (*)    GFR, Estimated 20 (*)    All other components within normal limits  CBC - Abnormal; Notable for the following components:   RBC 3.81 (*)    Hemoglobin 11.4 (*)     HCT 35.9 (*)    All other components within normal limits  URINALYSIS, ROUTINE W REFLEX MICROSCOPIC - Abnormal; Notable for the following components:   Color, Urine YELLOW (*)    APPearance CLOUDY (*)    Hgb urine dipstick SMALL (*)    Protein, ur >=300 (*)    Bacteria, UA RARE (*)    All other components within normal limits  CBG MONITORING, ED - Abnormal; Notable for the following components:   Glucose-Capillary 124 (*)    All other components within normal limits  RESP PANEL BY RT-PCR (RSV, FLU A&B, COVID)  [id number]   EKG ED ECG REPORT I, Naaman Plummer, the attending physician, personally viewed and interpreted this ECG. Date: 10/14/2022 EKG Time: 1052 Rate: 95 Rhythm: normal sinus rhythm QRS Axis: normal Intervals: normal ST/T Wave abnormalities: normal Narrative Interpretation: no evidence of acute ischemia RADIOLOGY ED MD interpretation: CT of the head without contrast interpreted by me shows no evidence of acute abnormalities including no intracerebral hemorrhage, obvious masses, or significant edema -Agree with radiology assessment Official radiology report(s): CT Head Wo Contrast  Result Date: 10/14/2022 CLINICAL DATA:  Mental status change, unknown cause new confusion, dizziness, recent ischemic stroke EXAM: CT HEAD WITHOUT CONTRAST TECHNIQUE: Contiguous axial images were obtained from the base of the skull through the vertex without intravenous contrast. RADIATION DOSE REDUCTION: This exam was performed according to the departmental dose-optimization program which includes automated exposure control, adjustment of the mA and/or kV according to patient size and/or use of iterative reconstruction technique. COMPARISON:  MRI head 10/11/2022. FINDINGS: Brain: Known recent infarct in the right basal ganglia was better assessed on recent MRI. No evidence of acute infarction, hemorrhage, hydrocephalus, extra-axial collection or mass lesion/mass effect. Patchy white matter  hypodensities, nonspecific but compatible with chronic microvascular ischemic disease. Vascular: No hyperdense vessel or unexpected calcification. Skull: Normal. Negative for fracture or focal lesion. Sinuses/Orbits: No acute finding. IMPRESSION: No acute finding. Known recent infarct in the right basal ganglia was better assessed on recent MRI. Chronic microvascular ischemic disease. Electronically Signed   By: Margaretha Sheffield M.D.   On: 10/14/2022 12:36   PROCEDURES: Critical Care performed: No .1-3 Lead EKG Interpretation  Performed by: Naaman Plummer, MD Authorized by: Naaman Plummer, MD     Interpretation: normal     ECG rate:  71   ECG rate assessment: normal     Rhythm: sinus rhythm     Ectopy: none     Conduction: normal    MEDICATIONS ORDERED IN ED: Medications - No data to display IMPRESSION / MDM / [place] / ED COURSE  I reviewed the triage vital signs and the nursing notes.                             The patient is on the cardiac monitor to evaluate for evidence of arrhythmia and/or significant heart rate changes. Patient's presentation is most consistent with acute presentation with potential threat to life or bodily function. This patient presents with generalized weakness and fatigue likely secondary to dehydration. Suspect acute kidney injury of prerenal origin. Doubt intrinsic renal dysfunction or obstructive nephropathy. Considered alternate etiologies of the patients symptoms including infectious processes, severe metabolic derangements or electrolyte abnormalities, ischemia/ACS, heart failure, and intracranial/central processes but think these are unlikely given the history and physical exam.  Plan: labs, 1Lfluid resuscitation, pain/nausea control, reassessment  Upon reassessment, patient's blood pressure significantly improved and patient has no further complaints.  Dispo: Discharge home with PCP follow-up   FINAL CLINICAL IMPRESSION(S) / ED  DIAGNOSES   Final diagnoses:  Dizziness  Dehydration   Rx / DC Orders   ED Discharge Orders     None      Note:  This document was prepared using Dragon voice recognition software and may include unintentional dictation errors.   Naaman Plummer, MD 10/14/22 [phone]

## 2022-10-14 NOTE — ED Notes (Signed)
This RN at bedside, a&ox3.

## 2022-10-14 NOTE — ED Notes (Signed)
Pt had a bm at discharge, cleaned and provided new brief.  Wheeled to front with discharge papers.  Transferred from wheelchair to [place] with assistance of daughter. Pt in NAD at time of discharge.

## 2022-10-14 NOTE — ED Notes (Signed)
Called daughter to let her know that pt is ready for discharge, she stated she will be here around 3:45.

## 2022-10-14 NOTE — ED Triage Notes (Addendum)
First RN note-  Pt BIB [facility] for new onset of dizziness, after beign released yesterday for a stroke. Pt has residual right sided weakness, but is at baseline per caretaker. EMS reports the only new symptom is the stroke. Pt has not been able to stand and walk since the stroke.  EMS vitals: BP 142/78 CBG 178

## 2022-10-14 NOTE — ED Provider Triage Note (Signed)
Emergency Medicine Provider Triage Evaluation Note  Alicia Garner , a 82 y.o. female  was evaluated in triage.  Pt complains of dizziness.  The patient is a poor historian and cannot describe well.  She is not oriented to year but is able to tell me her full name.  She cannot describe her symptoms well.  The time I see here she does not have any particular concern or symptom, but recalls feeling dizzy this morning but cannot tell me when.  She does not know when she last felt normal  Review of Systems  Positive: Felt dizzy evidently earlier today Negative: No headache  Physical Exam  BP 129/82   Pulse 89   Temp 99.5 F (37.5 C)   Resp 18   LMP  (LMP Unknown)   SpO2 96%  Gen:   Awake, no distress   Resp:  Normal effort  MSK:   Moves extremities without difficulty though she seems slightly weak on her right arm.  Nursing reports chronic weakness on the right side was reported to them by EMS. Other:  She moves her left upper arm without obvious deficit.  Able to wiggle her toes and move her lower legs with bilateral lower extremity weakness that appears to be equal.  No facial droop.  She does not have any obvious acute new focal neurologic deficit but is also not deemed a very reliable historian.  Medical Decision Making  Medically screening exam initiated at 10:46 AM.  Appropriate orders placed.  Alicia Garner was informed that the remainder of the evaluation will be completed by another provider, this initial triage assessment does not replace that evaluation, and the importance of remaining in the ED until their evaluation is complete.  Had a recent admission for ischemic stroke.  CT head ordered to evaluate exclude hemorrhage.  I doubt she is having a new acute stroke that would be amenable to thrombolytic, she has a recent stroke in the last few days and would not be a thrombolytic candidate nonetheless.  Workup for altered mental status initiated at triage   Delman Kitten, MD 10/14/22  1048

## 2022-10-14 NOTE — ED Triage Notes (Signed)
Pt arrived via EMS form home (see first rn note) after being discharged yesterday after receiving care for a stroke. During triage, pt is AxOx3. Pt reports waking up with dizziness this morning. Poor historian due to some confusion. PT denies any new symptoms.

## 2022-10-18 ENCOUNTER — Other Ambulatory Visit: Payer: Self-pay

## 2022-10-18 ENCOUNTER — Encounter: Payer: Self-pay | Admitting: Internal Medicine

## 2022-10-18 ENCOUNTER — Emergency Department: Payer: Medicare HMO

## 2022-10-18 ENCOUNTER — Inpatient Hospital Stay
Admission: EM | Admit: 2022-10-18 | Discharge: 2022-10-26 | DRG: 683 | Disposition: A | Discharge status: Skilled Nursing Facility | Payer: Medicare HMO | Attending: Obstetrics and Gynecology | Admitting: Obstetrics and Gynecology

## 2022-10-18 DIAGNOSIS — R531 Weakness: Secondary | ICD-10-CM | POA: Diagnosis not present

## 2022-10-18 DIAGNOSIS — I639 Cerebral infarction, unspecified: Secondary | ICD-10-CM | POA: Diagnosis present

## 2022-10-18 DIAGNOSIS — Z7982 Long term (current) use of aspirin: Secondary | ICD-10-CM

## 2022-10-18 DIAGNOSIS — Z66 Do not resuscitate: Secondary | ICD-10-CM | POA: Diagnosis present

## 2022-10-18 DIAGNOSIS — Z1152 Encounter for screening for COVID-19: Secondary | ICD-10-CM

## 2022-10-18 DIAGNOSIS — M109 Gout, unspecified: Secondary | ICD-10-CM | POA: Diagnosis present

## 2022-10-18 DIAGNOSIS — I6339 Cerebral infarction due to thrombosis of other cerebral artery: Secondary | ICD-10-CM | POA: Diagnosis not present

## 2022-10-18 DIAGNOSIS — E785 Hyperlipidemia, unspecified: Secondary | ICD-10-CM | POA: Diagnosis present

## 2022-10-18 DIAGNOSIS — R278 Other lack of coordination: Secondary | ICD-10-CM | POA: Diagnosis not present

## 2022-10-18 DIAGNOSIS — N184 Chronic kidney disease, stage 4 (severe): Secondary | ICD-10-CM

## 2022-10-18 DIAGNOSIS — R5381 Other malaise: Secondary | ICD-10-CM | POA: Diagnosis not present

## 2022-10-18 DIAGNOSIS — L22 Diaper dermatitis: Secondary | ICD-10-CM | POA: Diagnosis not present

## 2022-10-18 DIAGNOSIS — I10 Essential (primary) hypertension: Secondary | ICD-10-CM | POA: Diagnosis present

## 2022-10-18 DIAGNOSIS — Z7902 Long term (current) use of antithrombotics/antiplatelets: Secondary | ICD-10-CM

## 2022-10-18 DIAGNOSIS — E871 Hypo-osmolality and hyponatremia: Secondary | ICD-10-CM | POA: Diagnosis not present

## 2022-10-18 DIAGNOSIS — W19XXXA Unspecified fall, initial encounter: Secondary | ICD-10-CM | POA: Diagnosis not present

## 2022-10-18 DIAGNOSIS — E1122 Type 2 diabetes mellitus with diabetic chronic kidney disease: Secondary | ICD-10-CM | POA: Diagnosis present

## 2022-10-18 DIAGNOSIS — R4182 Altered mental status, unspecified: Secondary | ICD-10-CM | POA: Diagnosis not present

## 2022-10-18 DIAGNOSIS — E1129 Type 2 diabetes mellitus with other diabetic kidney complication: Secondary | ICD-10-CM | POA: Diagnosis not present

## 2022-10-18 DIAGNOSIS — Z7189 Other specified counseling: Secondary | ICD-10-CM | POA: Diagnosis not present

## 2022-10-18 DIAGNOSIS — I69354 Hemiplegia and hemiparesis following cerebral infarction affecting left non-dominant side: Secondary | ICD-10-CM | POA: Diagnosis not present

## 2022-10-18 DIAGNOSIS — Z8249 Family history of ischemic heart disease and other diseases of the circulatory system: Secondary | ICD-10-CM | POA: Diagnosis not present

## 2022-10-18 DIAGNOSIS — E860 Dehydration: Secondary | ICD-10-CM | POA: Diagnosis present

## 2022-10-18 DIAGNOSIS — F0390 Unspecified dementia without behavioral disturbance: Secondary | ICD-10-CM | POA: Diagnosis present

## 2022-10-18 DIAGNOSIS — Z79899 Other long term (current) drug therapy: Secondary | ICD-10-CM

## 2022-10-18 DIAGNOSIS — E8721 Acute metabolic acidosis: Secondary | ICD-10-CM | POA: Diagnosis not present

## 2022-10-18 DIAGNOSIS — I6381 Other cerebral infarction due to occlusion or stenosis of small artery: Secondary | ICD-10-CM | POA: Diagnosis not present

## 2022-10-18 DIAGNOSIS — D649 Anemia, unspecified: Secondary | ICD-10-CM | POA: Diagnosis not present

## 2022-10-18 DIAGNOSIS — R29810 Facial weakness: Secondary | ICD-10-CM | POA: Diagnosis not present

## 2022-10-18 DIAGNOSIS — I130 Hypertensive heart and chronic kidney disease with heart failure and stage 1 through stage 4 chronic kidney disease, or unspecified chronic kidney disease: Secondary | ICD-10-CM | POA: Diagnosis present

## 2022-10-18 DIAGNOSIS — N179 Acute kidney failure, unspecified: Secondary | ICD-10-CM

## 2022-10-18 DIAGNOSIS — Z515 Encounter for palliative care: Secondary | ICD-10-CM | POA: Diagnosis not present

## 2022-10-18 DIAGNOSIS — Z7401 Bed confinement status: Secondary | ICD-10-CM | POA: Diagnosis not present

## 2022-10-18 DIAGNOSIS — R42 Dizziness and giddiness: Secondary | ICD-10-CM | POA: Diagnosis not present

## 2022-10-18 DIAGNOSIS — Z833 Family history of diabetes mellitus: Secondary | ICD-10-CM | POA: Diagnosis not present

## 2022-10-18 DIAGNOSIS — D631 Anemia in chronic kidney disease: Secondary | ICD-10-CM | POA: Diagnosis present

## 2022-10-18 DIAGNOSIS — K5900 Constipation, unspecified: Secondary | ICD-10-CM | POA: Diagnosis present

## 2022-10-18 DIAGNOSIS — R29898 Other symptoms and signs involving the musculoskeletal system: Secondary | ICD-10-CM | POA: Diagnosis not present

## 2022-10-18 DIAGNOSIS — R0902 Hypoxemia: Secondary | ICD-10-CM | POA: Diagnosis not present

## 2022-10-18 DIAGNOSIS — I5032 Chronic diastolic (congestive) heart failure: Secondary | ICD-10-CM | POA: Diagnosis not present

## 2022-10-18 DIAGNOSIS — I69920 Aphasia following unspecified cerebrovascular disease: Secondary | ICD-10-CM | POA: Diagnosis not present

## 2022-10-18 DIAGNOSIS — R000 Tachycardia, unspecified: Secondary | ICD-10-CM | POA: Diagnosis not present

## 2022-10-18 DIAGNOSIS — D638 Anemia in other chronic diseases classified elsewhere: Secondary | ICD-10-CM | POA: Diagnosis not present

## 2022-10-18 DIAGNOSIS — R41841 Cognitive communication deficit: Secondary | ICD-10-CM | POA: Diagnosis not present

## 2022-10-18 DIAGNOSIS — M79671 Pain in right foot: Secondary | ICD-10-CM | POA: Diagnosis present

## 2022-10-18 DIAGNOSIS — M6281 Muscle weakness (generalized): Secondary | ICD-10-CM | POA: Diagnosis not present

## 2022-10-18 DIAGNOSIS — E1169 Type 2 diabetes mellitus with other specified complication: Secondary | ICD-10-CM | POA: Diagnosis not present

## 2022-10-18 DIAGNOSIS — G459 Transient cerebral ischemic attack, unspecified: Secondary | ICD-10-CM | POA: Diagnosis not present

## 2022-10-18 LAB — COMPREHENSIVE METABOLIC PANEL
ALT: 23 U/L (ref 0–44)
AST: 28 U/L (ref 15–41)
Albumin: 3.3 g/dL — ABNORMAL LOW (ref 3.5–5.0)
Alkaline Phosphatase: 69 U/L (ref 38–126)
Anion gap: 14 (ref 5–15)
BUN: 43 mg/dL — ABNORMAL HIGH (ref 8–23)
CO2: 18 mmol/L — ABNORMAL LOW (ref 22–32)
Calcium: 8.7 mg/dL — ABNORMAL LOW (ref 8.9–10.3)
Chloride: 106 mmol/L (ref 98–111)
Creatinine, Ser: 3.28 mg/dL — ABNORMAL HIGH (ref 0.44–1.00)
GFR, Estimated: 14 mL/min — ABNORMAL LOW (ref >60)
Glucose, Bld: 128 mg/dL — ABNORMAL HIGH (ref 70–99)
Potassium: 4 mmol/L (ref 3.5–5.1)
Sodium: 138 mmol/L (ref 135–145)
Total Bilirubin: 0.9 mg/dL (ref 0.3–1.2)
Total Protein: 8 g/dL (ref 6.5–8.1)

## 2022-10-18 LAB — CBC WITH DIFFERENTIAL/PLATELET
Abs Immature Granulocytes: 0.03 10*3/uL (ref 0.00–0.07)
Basophils Absolute: 0 10*3/uL (ref 0.0–0.1)
Basophils Relative: 0 %
Eosinophils Absolute: 0.2 10*3/uL (ref 0.0–0.5)
Eosinophils Relative: 2 %
HCT: 33.9 % — ABNORMAL LOW (ref 36.0–46.0)
Hemoglobin: 10.9 g/dL — ABNORMAL LOW (ref 12.0–15.0)
Immature Granulocytes: 0 %
Lymphocytes Relative: 21 %
Lymphs Abs: 1.7 10*3/uL (ref 0.7–4.0)
MCH: 30 pg (ref 26.0–34.0)
MCHC: 32.2 g/dL (ref 30.0–36.0)
MCV: 93.4 fL (ref 80.0–100.0)
Monocytes Absolute: 0.7 10*3/uL (ref 0.1–1.0)
Monocytes Relative: 8 %
Neutro Abs: 5.6 10*3/uL (ref 1.7–7.7)
Neutrophils Relative %: 69 %
Platelets: 171 10*3/uL (ref 150–400)
RBC: 3.63 MIL/uL — ABNORMAL LOW (ref 3.87–5.11)
RDW: 14.6 % (ref 11.5–15.5)
WBC: 8.3 10*3/uL (ref 4.0–10.5)
nRBC: 0 % (ref 0.0–0.2)

## 2022-10-18 LAB — URIC ACID: Uric Acid, Serum: 7.2 mg/dL — ABNORMAL HIGH (ref 2.5–7.1)

## 2022-10-18 LAB — TROPONIN I (HIGH SENSITIVITY): Troponin I (High Sensitivity): 12 ng/L (ref <18)

## 2022-10-18 LAB — GLUCOSE, CAPILLARY
Glucose-Capillary: 166 mg/dL — ABNORMAL HIGH (ref 70–99)
Glucose-Capillary: 209 mg/dL — ABNORMAL HIGH (ref 70–99)

## 2022-10-18 LAB — PROCALCITONIN: Procalcitonin: 0.16 ng/mL

## 2022-10-18 LAB — RESP PANEL BY RT-PCR (RSV, FLU A&B, COVID)  RVPGX2
Influenza A by PCR: NEGATIVE
Influenza B by PCR: NEGATIVE
Resp Syncytial Virus by PCR: NEGATIVE
SARS Coronavirus 2 by RT PCR: NEGATIVE

## 2022-10-18 LAB — BRAIN NATRIURETIC PEPTIDE: B Natriuretic Peptide: 37.6 pg/mL (ref 0.0–100.0)

## 2022-10-18 MED ORDER — VITAMIN B-12 1000 MCG PO TABS
1000.0000 ug | ORAL_TABLET | Freq: Every day | ORAL | Status: DC
  Administered 2022-10-18 – 2022-10-26 (×9): 1000 ug via ORAL
  Filled 2022-10-18 (×9): qty 1

## 2022-10-18 MED ORDER — SODIUM CHLORIDE 0.9 % IV BOLUS
500.0000 mL | Freq: Once | INTRAVENOUS | Status: AC
  Administered 2022-10-18: 500 mL via INTRAVENOUS

## 2022-10-18 MED ORDER — INSULIN ASPART 100 UNIT/ML IJ SOLN
0.0000 [IU] | Freq: Three times a day (TID) | INTRAMUSCULAR | Status: DC
  Administered 2022-10-18: 2 [IU] via SUBCUTANEOUS
  Administered 2022-10-19: 3 [IU] via SUBCUTANEOUS
  Administered 2022-10-19: 1 [IU] via SUBCUTANEOUS
  Administered 2022-10-20 – 2022-10-21 (×3): 3 [IU] via SUBCUTANEOUS
  Administered 2022-10-21: 1 [IU] via SUBCUTANEOUS
  Administered 2022-10-21: 2 [IU] via SUBCUTANEOUS
  Administered 2022-10-22: 3 [IU] via SUBCUTANEOUS
  Administered 2022-10-22: 1 [IU] via SUBCUTANEOUS
  Administered 2022-10-23: 5 [IU] via SUBCUTANEOUS
  Administered 2022-10-23: 2 [IU] via SUBCUTANEOUS
  Administered 2022-10-24 (×2): 1 [IU] via SUBCUTANEOUS
  Administered 2022-10-24: 3 [IU] via SUBCUTANEOUS
  Administered 2022-10-25: 1 [IU] via SUBCUTANEOUS
  Administered 2022-10-25: 2 [IU] via SUBCUTANEOUS
  Filled 2022-10-18 (×18): qty 1

## 2022-10-18 MED ORDER — INSULIN ASPART 100 UNIT/ML IJ SOLN
0.0000 [IU] | Freq: Every day | INTRAMUSCULAR | Status: DC
  Administered 2022-10-18: 2 [IU] via SUBCUTANEOUS
  Administered 2022-10-19 – 2022-10-23 (×4): 3 [IU] via SUBCUTANEOUS
  Administered 2022-10-24: 2 [IU] via SUBCUTANEOUS
  Filled 2022-10-18 (×6): qty 1

## 2022-10-18 MED ORDER — AMLODIPINE BESYLATE 10 MG PO TABS
10.0000 mg | ORAL_TABLET | Freq: Every day | ORAL | Status: DC
  Administered 2022-10-18 – 2022-10-26 (×9): 10 mg via ORAL
  Filled 2022-10-18 (×9): qty 1

## 2022-10-18 MED ORDER — ONDANSETRON HCL 4 MG/2ML IJ SOLN: 4.0000 mg | Freq: Three times a day (TID) | INTRAMUSCULAR | Status: DC | PRN

## 2022-10-18 MED ORDER — HYDRALAZINE HCL 20 MG/ML IJ SOLN: 5.0000 mg | INTRAMUSCULAR | Status: DC | PRN

## 2022-10-18 MED ORDER — OXYCODONE-ACETAMINOPHEN 5-325 MG PO TABS: 1.0000 | ORAL_TABLET | ORAL | Status: DC | PRN

## 2022-10-18 MED ORDER — ASPIRIN 81 MG PO TBEC
81.0000 mg | DELAYED_RELEASE_TABLET | Freq: Every day | ORAL | Status: DC
  Administered 2022-10-18 – 2022-10-26 (×9): 81 mg via ORAL
  Filled 2022-10-18 (×9): qty 1

## 2022-10-18 MED ORDER — POLYETHYLENE GLYCOL 3350 17 G PO PACK
17.0000 g | PACK | Freq: Every day | ORAL | Status: DC | PRN
  Administered 2022-10-25: 17 g via ORAL
  Filled 2022-10-18: qty 1

## 2022-10-18 MED ORDER — METHYLPREDNISOLONE SODIUM SUCC 40 MG IJ SOLR
40.0000 mg | Freq: Every day | INTRAMUSCULAR | Status: DC
  Administered 2022-10-18 – 2022-10-19 (×2): 40 mg via INTRAVENOUS
  Filled 2022-10-18 (×2): qty 1

## 2022-10-18 MED ORDER — ROSUVASTATIN CALCIUM 10 MG PO TABS
40.0000 mg | ORAL_TABLET | Freq: Every evening | ORAL | Status: DC
  Administered 2022-10-18 – 2022-10-25 (×8): 40 mg via ORAL
  Filled 2022-10-18 (×7): qty 4
  Filled 2022-10-18: qty 2
  Filled 2022-10-18: qty 4

## 2022-10-18 MED ORDER — HEPARIN SODIUM (PORCINE) 5000 UNIT/ML IJ SOLN
5000.0000 [IU] | Freq: Three times a day (TID) | INTRAMUSCULAR | Status: DC
  Administered 2022-10-18 – 2022-10-26 (×24): 5000 [IU] via SUBCUTANEOUS
  Filled 2022-10-18 (×24): qty 1

## 2022-10-18 MED ORDER — CLOPIDOGREL BISULFATE 75 MG PO TABS
75.0000 mg | ORAL_TABLET | Freq: Every day | ORAL | Status: DC
  Administered 2022-10-18 – 2022-10-26 (×9): 75 mg via ORAL
  Filled 2022-10-18 (×9): qty 1

## 2022-10-18 MED ORDER — ACETAMINOPHEN 325 MG PO TABS
650.0000 mg | ORAL_TABLET | Freq: Four times a day (QID) | ORAL | Status: DC | PRN
  Administered 2022-10-25: 650 mg via ORAL
  Filled 2022-10-18: qty 2

## 2022-10-18 MED ORDER — SODIUM CHLORIDE 0.9 % IV SOLN: INTRAVENOUS | Status: DC

## 2022-10-18 NOTE — H&P (Signed)
History and Physical    Alicia Garner TDD:[id number] DOB: 1941/04/28 DOA: 10/18/2022  Referring MD/NP/PA:   PCP: [facility], [facility]   Patient coming from:  The patient is coming from home.  Chief Complaint: Generalized weakness, bilateral foot pain  HPI: Alicia Garner is a 82 y.o. female with medical history significant of recent admission due to stroke with left-sided weakness, hypertension, hyperlipidemia, diabetes mellitus, CKD stage IV, anemia, who presents with generalized weakness, bilateral foot pain.  Patient was recently hospitalized due to stroke from 1/22 to 1/24.  Patient has left sided weakness and difficulty walking after stroke. Patient came back to the emergency room on 1/25 after being released on 1/24 where she was complained of decreased p.o. intake.  Patient was given 1 L of fluids and had improvement of her blood pressure and so she was discharged.  Today, pt comes back due to generalized weakness in the past 3 days, poor appetite and decreased oral intake.  Patient also reports bilateral foot pain which is constant, severe, sharp, nonradiating, even light touch causes severe pain to both feet.  Her feet are mildly swollen and warm. No injury.  No fever or chills.  Patient denies chest pain, cough, shortness breath.  No nausea, vomiting, diarrhea or abdominal pain but denies symptoms of UTI.  Data reviewed independently and ED Course: pt was found to have WBC 8.3, lactic acid 12, negative PCR for COVID and flu.  Worsening renal function with creatinine 3.29, BUN 43, GFR 14 (recent baseline creatinine 2.43)., Oxygen saturation 96% on room air, temperature 99.6, blood pressure 115/70, heart rate 92, RR 15.  CT of head is negative for acute new intra-abdominal issues, but showed previous infarction.  Patient is placed on telemetry bed for observation.   EKG: I have personally reviewed.  Sinus rhythm, QTc 466, T wave inversion only in lead III, LAD, poor R wave progression,  LVH.   Review of Systems:   General: no fevers, chills, no body weight gain, has poor appetite, has fatigue HEENT: no blurry vision, hearing changes or sore throat Respiratory: no dyspnea, coughing, wheezing CV: no chest pain, no palpitations GI: no nausea, vomiting, abdominal pain, diarrhea, constipation GU: no dysuria, burning on urination, increased urinary frequency, hematuria  Ext: Has pain in both feet, has feet swelling Neuro:  no vision change or hearing loss.  Has left-sided weakness Skin: no rash, no skin tear. MSK: No muscle spasm, no deformity, no limitation of range of movement in spin Heme: No easy bruising.  Travel history: No recent long distant travel.   Allergy:  Allergies  Allergen Reactions   Lisinopril Swelling    Past Medical History:  Diagnosis Date   Diabetes mellitus without complication ([place])    Hyperlipidemia    Hypertension    Stroke [facility])     History reviewed. No pertinent surgical history.  Social History:  reports that she has never smoked. She has never used smokeless tobacco. She reports that she does not drink alcohol and does not use drugs.  Family History:  Family History  Problem Relation Age of Onset   Hypertension Mother    Diabetes Mother    Diabetes Father    Hypertension Father      Prior to Admission medications   Medication Sig Start Date End Date Taking? Authorizing Provider  amLODipine (NORVASC) 10 MG tablet Take 10 mg by mouth daily. 10/10/21   [provider]  aspirin EC 81 MG tablet Take 81 mg by mouth daily.  Swallow whole.    [provider]  Cholecalciferol 25 MCG (1000 UT) capsule Take by mouth. 12/22/18   [provider]  clopidogrel (PLAVIX) 75 MG tablet Take 1 tablet (75 mg total) by mouth daily. 10/14/22   Lorella Nimrod, MD  colchicine 0.6 MG tablet Take 0.5 tablets (0.3 mg total) by mouth every 3 (three) days. 12/10/21   Loletha Grayer, MD  cyanocobalamin 1000 MCG tablet Take 1 tablet  (1,000 mcg total) by mouth daily. 10/14/22   Lorella Nimrod, MD  diclofenac Sodium (VOLTAREN) 1 % GEL Apply 2 g topically 4 (four) times daily.    [provider]  losartan (COZAAR) 50 MG tablet Take 1 tablet (50 mg total) by mouth daily. Hold for 3 days. 10/13/22   Lorella Nimrod, MD  polyethylene glycol (MIRALAX / GLYCOLAX) 17 g packet Take 17 g by mouth daily as needed for moderate constipation. 12/08/21   Loletha Grayer, MD  rosuvastatin (CRESTOR) 40 MG tablet Take 1 tablet (40 mg total) by mouth daily. 10/14/22   Lorella Nimrod, MD  sitaGLIPtin (JANUVIA) 25 MG tablet Take 1 tablet (25 mg total) by mouth once daily For diabetes 04/30/21   [provider]    Physical Exam: Vitals:   10/18/22 1358 10/18/22 1359 10/18/22 1400 10/18/22 1648  BP:  (!) 142/73  (!) 146/81  Pulse: (!) 102 (!) 102 92 86  Resp: (!) 1 18  18   Temp: 98.8 F (37.1 C) (!) 96.7 F (35.9 C) 98.4 F (36.9 C) 98.2 F (36.8 C)  TempSrc:  Oral    SpO2:    96%  Weight:  72.6 kg    Height:  5\' 4"  (1.626 m)     General: Not in acute distress HEENT:       Eyes: PERRL, EOMI, no scleral icterus.       ENT: No discharge from the ears and nose, no pharynx injection, no tonsillar enlargement.        Neck: No JVD, no bruit, no mass felt. Heme: No neck lymph node enlargement. Cardiac: S1/S2, RRR, No murmurs, No gallops or rubs. Respiratory: No rales, wheezing, rhonchi or rubs. GI: Soft, nondistended, nontender, no rebound pain, no organomegaly, BS present. GU: No hematuria Ext: 1+DP/PT pulse bilaterally.  Has severe tenderness, mild swelling, warmth in both feet Musculoskeletal: No joint deformities, No joint redness or warmth, no limitation of ROM in spin. Skin: No rashes.  Neuro: Alert, oriented X3, cranial nerves II-XII grossly intact, has left-sided weakness from previous stroke Psych: Patient is not psychotic, no suicidal or hemocidal ideation.  Labs on Admission: I have personally reviewed following  labs and imaging studies  CBC: Recent Labs  Lab 10/14/22 1054 10/18/22 0954  WBC 8.4 8.3  NEUTROABS  --  5.6  HGB 11.4* 10.9*  HCT 35.9* 33.9*  MCV 94.2 93.4  PLT 153 191   Basic Metabolic Panel: Recent Labs  Lab 10/14/22 1054 10/18/22 0954  NA 138 138  K 4.5 4.0  CL 109 106  CO2 23 18*  GLUCOSE 138* 128*  BUN 34* 43*  CREATININE 2.43* 3.28*  CALCIUM 8.9 8.7*   GFR: Estimated Creatinine Clearance: 13.4 mL/min (A) (by C-G formula based on SCr of 3.28 mg/dL (H)). Liver Function Tests: Recent Labs  Lab 10/14/22 1054 10/18/22 0954  AST 22 28  ALT 11 23  ALKPHOS 56 69  BILITOT 0.9 0.9  PROT 7.8 8.0  ALBUMIN 3.3* 3.3*   No results for input(s): "LIPASE", "AMYLASE" in the  last 168 hours. No results for input(s): "AMMONIA" in the last 168 hours. Coagulation Profile: No results for input(s): "INR", "PROTIME" in the last 168 hours.  Cardiac Enzymes: No results for input(s): "CKTOTAL", "CKMB", "CKMBINDEX", "TROPONINI" in the last 168 hours. BNP (last 3 results) No results for input(s): "PROBNP" in the last 8760 hours. HbA1C: No results for input(s): "HGBA1C" in the last 72 hours. CBG: Recent Labs  Lab 10/12/22 2016 10/13/22 0738 10/13/22 1208 10/14/22 1050 10/18/22 1730  GLUCAP 100* 96 95 124* 166*   Lipid Profile: No results for input(s): "CHOL", "HDL", "LDLCALC", "TRIG", "CHOLHDL", "LDLDIRECT" in the last 72 hours. Thyroid Function Tests: No results for input(s): "TSH", "T4TOTAL", "FREET4", "T3FREE", "THYROIDAB" in the last 72 hours. Anemia Panel: No results for input(s): "VITAMINB12", "FOLATE", "FERRITIN", "TIBC", "IRON", "RETICCTPCT" in the last 72 hours. Urine analysis:    Component Value Date/Time   COLORURINE YELLOW (A) 10/14/2022 1339   APPEARANCEUR CLOUDY (A) 10/14/2022 1339   LABSPEC 1.015 10/14/2022 1339   PHURINE 5.0 10/14/2022 1339   GLUCOSEU NEGATIVE 10/14/2022 1339   HGBUR SMALL (A) 10/14/2022 1339   BILIRUBINUR NEGATIVE 10/14/2022  1339   KETONESUR NEGATIVE 10/14/2022 1339   PROTEINUR >=300 (A) 10/14/2022 1339   NITRITE NEGATIVE 10/14/2022 1339   LEUKOCYTESUR NEGATIVE 10/14/2022 1339   Sepsis Labs: @LABRCNTIP (procalcitonin:4,lacticidven:4) ) Recent Results (from the past 240 hour(s))  Resp panel by RT-PCR (RSV, Flu A&B, Covid) Anterior Nasal Swab     Status: None   Collection Time: 10/14/22 12:39 PM   Specimen: Anterior Nasal Swab  Result Value Ref Range Status   SARS Coronavirus 2 by RT PCR NEGATIVE NEGATIVE Final    Comment: (NOTE) SARS-CoV-2 target nucleic acids are NOT DETECTED.  The SARS-CoV-2 RNA is generally detectable in upper respiratory specimens during the acute phase of infection. The lowest concentration of SARS-CoV-2 viral copies this assay can detect is 138 copies/mL. A negative result does not preclude SARS-Cov-2 infection and should not be used as the sole basis for treatment or other patient management decisions. A negative result may occur with  improper specimen collection/handling, submission of specimen other than nasopharyngeal swab, presence of viral mutation(s) within the areas targeted by this assay, and inadequate number of viral copies(<138 copies/mL). A negative result must be combined with clinical observations, patient history, and epidemiological information. The expected result is Negative.  Fact Sheet for Patients:  EntrepreneurPulse.com.au  Fact Sheet for Healthcare Providers:  IncredibleEmployment.be  This test is no t yet approved or cleared by the [place] FDA and  has been authorized for detection and/or diagnosis of SARS-CoV-2 by FDA under an Emergency Use Authorization (EUA). This EUA will remain  in effect (meaning this test can be used) for the duration of the COVID-19 declaration under Section 564(b)(1) of the Act, 21 U.S.C.section 360bbb-3(b)(1), unless the authorization is terminated  or revoked sooner.        Influenza A by PCR NEGATIVE NEGATIVE Final   Influenza B by PCR NEGATIVE NEGATIVE Final    Comment: (NOTE) The Xpert Xpress SARS-CoV-2/FLU/RSV plus assay is intended as an aid in the diagnosis of influenza from Nasopharyngeal swab specimens and should not be used as a sole basis for treatment. Nasal washings and aspirates are unacceptable for Xpert Xpress SARS-CoV-2/FLU/RSV testing.  Fact Sheet for Patients: EntrepreneurPulse.com.au  Fact Sheet for Healthcare Providers: IncredibleEmployment.be  This test is not yet approved or cleared by the [place] FDA and has been authorized for detection and/or diagnosis of SARS-CoV-2 by FDA under  an Emergency Use Authorization (EUA). This EUA will remain in effect (meaning this test can be used) for the duration of the COVID-19 declaration under Section 564(b)(1) of the Act, 21 U.S.C. section 360bbb-3(b)(1), unless the authorization is terminated or revoked.     Resp Syncytial Virus by PCR NEGATIVE NEGATIVE Final    Comment: (NOTE) Fact Sheet for Patients: EntrepreneurPulse.com.au  Fact Sheet for Healthcare Providers: IncredibleEmployment.be  This test is not yet approved or cleared by the [place] FDA and has been authorized for detection and/or diagnosis of SARS-CoV-2 by FDA under an Emergency Use Authorization (EUA). This EUA will remain in effect (meaning this test can be used) for the duration of the COVID-19 declaration under Section 564(b)(1) of the Act, 21 U.S.C. section 360bbb-3(b)(1), unless the authorization is terminated or revoked.  Performed at [facility], [address] [place], [place] [address]   Resp panel by RT-PCR (RSV, Flu A&B, Covid) Anterior Nasal Swab     Status: None   Collection Time: 10/18/22  9:59 AM   Specimen: Anterior Nasal Swab  Result Value Ref Range Status   SARS Coronavirus 2 by RT PCR NEGATIVE NEGATIVE  Final    Comment: (NOTE) SARS-CoV-2 target nucleic acids are NOT DETECTED.  The SARS-CoV-2 RNA is generally detectable in upper respiratory specimens during the acute phase of infection. The lowest concentration of SARS-CoV-2 viral copies this assay can detect is 138 copies/mL. A negative result does not preclude SARS-Cov-2 infection and should not be used as the sole basis for treatment or other patient management decisions. A negative result may occur with  improper specimen collection/handling, submission of specimen other than nasopharyngeal swab, presence of viral mutation(s) within the areas targeted by this assay, and inadequate number of viral copies(<138 copies/mL). A negative result must be combined with clinical observations, patient history, and epidemiological information. The expected result is Negative.  Fact Sheet for Patients:  EntrepreneurPulse.com.au  Fact Sheet for Healthcare Providers:  IncredibleEmployment.be  This test is no t yet approved or cleared by the [place] FDA and  has been authorized for detection and/or diagnosis of SARS-CoV-2 by FDA under an Emergency Use Authorization (EUA). This EUA will remain  in effect (meaning this test can be used) for the duration of the COVID-19 declaration under Section 564(b)(1) of the Act, 21 U.S.C.section 360bbb-3(b)(1), unless the authorization is terminated  or revoked sooner.       Influenza A by PCR NEGATIVE NEGATIVE Final   Influenza B by PCR NEGATIVE NEGATIVE Final    Comment: (NOTE) The Xpert Xpress SARS-CoV-2/FLU/RSV plus assay is intended as an aid in the diagnosis of influenza from Nasopharyngeal swab specimens and should not be used as a sole basis for treatment. Nasal washings and aspirates are unacceptable for Xpert Xpress SARS-CoV-2/FLU/RSV testing.  Fact Sheet for Patients: EntrepreneurPulse.com.au  Fact Sheet for Healthcare  Providers: IncredibleEmployment.be  This test is not yet approved or cleared by the [place] FDA and has been authorized for detection and/or diagnosis of SARS-CoV-2 by FDA under an Emergency Use Authorization (EUA). This EUA will remain in effect (meaning this test can be used) for the duration of the COVID-19 declaration under Section 564(b)(1) of the Act, 21 U.S.C. section 360bbb-3(b)(1), unless the authorization is terminated or revoked.     Resp Syncytial Virus by PCR NEGATIVE NEGATIVE Final    Comment: (NOTE) Fact Sheet for Patients: EntrepreneurPulse.com.au  Fact Sheet for Healthcare Providers: IncredibleEmployment.be  This test is not yet approved or cleared by  the [employer] and has been authorized for detection and/or diagnosis of SARS-CoV-2 by FDA under an Emergency Use Authorization (EUA). This EUA will remain in effect (meaning this test can be used) for the duration of the COVID-19 declaration under Section 564(b)(1) of the Act, 21 U.S.C. section 360bbb-3(b)(1), unless the authorization is terminated or revoked.  Performed at [facility], [address] [place], [place] [address]      Radiological Exams on Admission: CT HEAD WO CONTRAST (5MM)  Result Date: 10/18/2022 CLINICAL DATA:  Provided history: Mental status change, unknown cause. Additional history provided: Generalized weakness. Prior stroke this month with persistent deficits on left side. EXAM: CT HEAD WITHOUT CONTRAST TECHNIQUE: Contiguous axial images were obtained from the base of the skull through the vertex without intravenous contrast. RADIATION DOSE REDUCTION: This exam was performed according to the departmental dose-optimization program which includes automated exposure control, adjustment of the mA and/or kV according to patient size and/or use of iterative reconstruction technique. COMPARISON:  Prior head CT examinations  10/14/2022 and earlier. Brain MRI 10/11/2022. FINDINGS: Brain: Mild generalized parenchymal atrophy. A known recent infarct within the right basal ganglia/internal capsule was better appreciated on the prior brain MRI of 10/11/2022. Chronic lacunar infarcts within the bilateral cerebral hemispheric white matter and deep gray nuclei, many of which were better appreciated on the prior MRI. Background advanced chronic small-vessel ischemic changes within the cerebral white matter. There is no acute intracranial hemorrhage. No demarcated cortical infarct. No extra-axial fluid collection. No evidence of an intracranial mass. No midline shift. Vascular: No hyperdense vessel. Atherosclerotic calcifications. Skull: No fracture or aggressive osseous lesion. Sinuses/Orbits: No mass or acute finding within the imaged orbits. No significant paranasal sinus disease at the imaged levels. IMPRESSION: 1. A known recent infarct within the right basal ganglia/internal capsule was better appreciated on the prior brain MRI of 10/11/2022. 2. No evidence of an interval acute intracranial abnormality. 3. Background parenchymal atrophy and chronic small vessel ischemic disease, as described. Electronically Signed   By: Kellie Simmering D.O.   On: 10/18/2022 10:45      Assessment/Plan Principal Problem:   Acute renal failure superimposed on stage 4 chronic kidney disease ([name]) Active Problems:   Gout   Bilateral foot pain   CVA (cerebral vascular accident) ([place])   Generalized weakness   Essential hypertension   Chronic diastolic CHF (congestive heart failure) ([place])   Type II diabetes mellitus with renal manifestations ([name])   HLD (hyperlipidemia)   Normocytic anemia   Assessment and Plan:  Acute renal failure superimposed on stage 4 chronic kidney disease ([place]): Possibly due to dehydration and continuation of Cozaar, Voltaren  -Placed on TeleMed follow-up patient -Hold Cozaar, Voltaren gel -IV fluid: 500 cc normal  saline, then 75 cc/h -Follow-up urinalysis -Avoid using renal toxic medications  Gout and Bilateral foot pain: Patient has bilateral severe foot pain, possibly due to gout flareup. -Switch colchicine to Solu-Medrol 40 mg daily due to worsening renal function -Check procalcitonin level -Check uric acid level  CVA (cerebral vascular accident) ([place]) -Aspirin, Plavix, Crestor  Generalized weakness -PT/OT  Essential hypertension -IV hydralazine as needed -Hold Cozaar due to worsening renal function -Amlodipine  Chronic diastolic CHF (congestive heart failure) ([place]): Patient does not have leg edema or JVD.  Does not seem to have CHF exacerbation.  2D echo 10/12/2022 showed EF of 55-60 with grade 1 diastolic dysfunction. -Check BNP  Type II diabetes mellitus with renal manifestations [facility]): Recent A1c 6.1, well-controlled.  Patient is taking Januvia -SSI  HLD (hyperlipidemia) -Cresto  Normocytic anemia: Hemoglobin 10.9 (11.4 10/14/2022), no active bleeding -Follow-up with CBC      DVT ppx: SQ Heparin    Code Status: Full code per pt and her daughter  Family Communication: Yes, patient's  daughter  at bed side.     Disposition Plan:  Anticipate discharge back to previous environment  Consults called:  none  Admission status and Level of care: Telemetry Medical:     for obs    Dispo: The patient is from: Home              Anticipated d/c is to: Home              Anticipated d/c date is: 1 day              Patient currently is not medically stable to d/c.    Severity of Illness:  The appropriate patient status for this patient is OBSERVATION. Observation status is judged to be reasonable and necessary in order to provide the required intensity of service to ensure the patient's safety. The patient's presenting symptoms, physical exam findings, and initial radiographic and laboratory data in the context of their medical condition is felt to place them at decreased risk  for further clinical deterioration. Furthermore, it is anticipated that the patient will be medically stable for discharge from the hospital within 2 midnights of admission.        Date of Service 10/18/2022    [facility]   If 7PM-7AM, please contact night-coverage [email or web address] 10/18/2022, 6:35 PM Temperature 99.6, blood pressure 115/70, heart rate 92, RR 15

## 2022-10-18 NOTE — ED Triage Notes (Signed)
Pt to ED via home CC of generalized weakness x3 days. Pt had a stroke on 1/25 with some resisdual deficients on left side. Physician to bedside, no code stroke called at this time. Pt is A&O x 2. VS stable.

## 2022-10-18 NOTE — ED Notes (Signed)
Informed RN bed assigned 

## 2022-10-18 NOTE — ED Provider Notes (Signed)
[facility] Provider Note    Event Date/Time   First MD Initiated Contact with Patient 10/18/22 [phone]     (approximate)   History   Weakness   HPI  Alicia Garner is a 82 y.o. female with hypertension, hyperlipidemia, anemia of chronic disease, CKD who was admitted on 10/11/2022 for left-sided weakness and difficulty walking.  MRI did show posterior right basal ganglia infarct patient underwent stroke workup and patient has been on Plavix, aspirin for the next 3 weeks.  Patient was recommended discharge to SNF but patient opted to go home.  Patient came back to the emergency room on 1/25 after being released on 1/24 where she was complained of decreased p.o. intake.  Patient was given 1 L of fluids and had improvement of her blood pressure and so she was discharged.  According to patient she reports feeling generalized weakness for the past few days decreased p.o. intake.  Family is not at bedside to collaborate the story but will try to get some additional information when they come to the emergency room.  Per EMS they are on their way.  No new falls.  Patient denies any pain.   Family is at bedside and did confirm that there was no new weakness on one side of chest pain generalized decline and not able to care for her as well as before  Physical Exam   Triage Vital Signs: Blood pressure 115/70, pulse 92, SpO2 96 %.  Most recent vital signs: Vitals:   10/18/22 1000  BP: 115/70  Pulse: 92  SpO2: 96%     General: Awake, no distress.  CV:  Good peripheral perfusion.  Resp:  Normal effort.  Abd:  No distention.  Other:  Patient able to squeeze with both hands and move both legs slightly.  She is alert and oriented x 2.  Does not know the year.  ED Results / Procedures / Treatments   Labs (all labs ordered are listed, but only abnormal results are displayed) Labs Reviewed  RESP PANEL BY RT-PCR (RSV, FLU A&B, COVID)  [id number]  CBC WITH  DIFFERENTIAL/PLATELET  COMPREHENSIVE METABOLIC PANEL  URINALYSIS, W/ REFLEX TO CULTURE (INFECTION SUSPECTED)  TROPONIN I (HIGH SENSITIVITY)     EKG  My interpretation of EKG:  Normal sinus rate of 93 without any ST elevation, T wave inversion lead III, normal intervals  RADIOLOGY I have reviewed the CT had personally interpreted and no evidence of intracranial hemorrhage  PROCEDURES:  Critical Care performed: No  .1-3 Lead EKG Interpretation  Performed by: Vanessa Doon, MD Authorized by: Vanessa Polvadera, MD     Interpretation: normal     ECG rate assessment: normal     Rhythm: sinus rhythm     Ectopy: none     Conduction: normal      MEDICATIONS ORDERED IN ED: Medications  0.9 %  sodium chloride infusion ( Intravenous Restarted 10/18/22 1430)  ondansetron (ZOFRAN) injection 4 mg (has no administration in time range)  acetaminophen (TYLENOL) tablet 650 mg (has no administration in time range)  hydrALAZINE (APRESOLINE) injection 5 mg (has no administration in time range)  insulin aspart (novoLOG) injection 0-9 Units (has no administration in time range)  insulin aspart (novoLOG) injection 0-5 Units (has no administration in time range)  heparin injection 5,000 Units (5,000 Units Subcutaneous Given 10/18/22 1511)  methylPREDNISolone sodium succinate (SOLU-MEDROL) 40 mg/mL injection 40 mg (40 mg Intravenous Given 10/18/22 1511)  aspirin EC tablet 81 mg (81  mg Oral Given 10/18/22 1511)  amLODipine (NORVASC) tablet 10 mg (10 mg Oral Given 10/18/22 1511)  rosuvastatin (CRESTOR) tablet 40 mg (has no administration in time range)  polyethylene glycol (MIRALAX / GLYCOLAX) packet 17 g (has no administration in time range)  clopidogrel (PLAVIX) tablet 75 mg (75 mg Oral Given 10/18/22 1511)  cyanocobalamin (VITAMIN B12) tablet 1,000 mcg (1,000 mcg Oral Given 10/18/22 1511)  oxyCODONE-acetaminophen (PERCOCET/ROXICET) 5-325 MG per tablet 1 tablet (has no administration in time range)   sodium chloride 0.9 % bolus 500 mL (500 mLs Intravenous New Bag/Given 10/18/22 1432)     IMPRESSION / MDM / ASSESSMENT AND PLAN / ED COURSE  I reviewed the triage vital signs and the nursing notes.   Patient's presentation is most consistent with acute presentation with potential threat to life or bodily function.   Patient comes in with increasing weakness after having a stroke a few days ago.  Will get repeat CT head just to ensure no evidence of new intercranial hemorrhage although it seems like her neurostatus is at baseline from the prior stroke.  Will confirm with family but patient is out of the window for stroke code given symptoms have been going on for the last few days.  Repeat labs to evaluate for Electra abnormalities, AKI.   COVID, flu are negative.  Hemoglobin around baseline.  CMP shows increasing creatinine now up to 3.2 slightly low bicarb but anion gap is normal.  Troponin is negative.  CT head without any evidence of new intracranial hemorrhage.  She seems to be at her neurological baseline since the prior stroke but is just increasing weakness and unable to care for at home will discuss to the hospital team for admission for new AKI.   The patient is on the cardiac monitor to evaluate for evidence of arrhythmia and/or significant heart rate changes.      FINAL CLINICAL IMPRESSION(S) / ED DIAGNOSES   Final diagnoses:  Weakness  AKI (acute kidney injury) ([place])     Rx / DC Orders   ED Discharge Orders     None        Note:  This document was prepared using Dragon voice recognition software and may include unintentional dictation errors.   [name], MD 10/18/22 [phone]

## 2022-10-19 DIAGNOSIS — Z66 Do not resuscitate: Secondary | ICD-10-CM | POA: Diagnosis present

## 2022-10-19 DIAGNOSIS — Z1152 Encounter for screening for COVID-19: Secondary | ICD-10-CM | POA: Diagnosis not present

## 2022-10-19 DIAGNOSIS — E860 Dehydration: Secondary | ICD-10-CM | POA: Diagnosis present

## 2022-10-19 DIAGNOSIS — Z833 Family history of diabetes mellitus: Secondary | ICD-10-CM | POA: Diagnosis not present

## 2022-10-19 DIAGNOSIS — Z79899 Other long term (current) drug therapy: Secondary | ICD-10-CM | POA: Diagnosis not present

## 2022-10-19 DIAGNOSIS — I6339 Cerebral infarction due to thrombosis of other cerebral artery: Secondary | ICD-10-CM | POA: Diagnosis not present

## 2022-10-19 DIAGNOSIS — Z8249 Family history of ischemic heart disease and other diseases of the circulatory system: Secondary | ICD-10-CM | POA: Diagnosis not present

## 2022-10-19 DIAGNOSIS — I69354 Hemiplegia and hemiparesis following cerebral infarction affecting left non-dominant side: Secondary | ICD-10-CM | POA: Diagnosis not present

## 2022-10-19 DIAGNOSIS — E8721 Acute metabolic acidosis: Secondary | ICD-10-CM | POA: Diagnosis present

## 2022-10-19 DIAGNOSIS — Z515 Encounter for palliative care: Secondary | ICD-10-CM | POA: Diagnosis not present

## 2022-10-19 DIAGNOSIS — E785 Hyperlipidemia, unspecified: Secondary | ICD-10-CM | POA: Diagnosis present

## 2022-10-19 DIAGNOSIS — E1122 Type 2 diabetes mellitus with diabetic chronic kidney disease: Secondary | ICD-10-CM | POA: Diagnosis not present

## 2022-10-19 DIAGNOSIS — E871 Hypo-osmolality and hyponatremia: Secondary | ICD-10-CM | POA: Diagnosis not present

## 2022-10-19 DIAGNOSIS — F0390 Unspecified dementia without behavioral disturbance: Secondary | ICD-10-CM | POA: Diagnosis present

## 2022-10-19 DIAGNOSIS — Z7982 Long term (current) use of aspirin: Secondary | ICD-10-CM | POA: Diagnosis not present

## 2022-10-19 DIAGNOSIS — R531 Weakness: Secondary | ICD-10-CM | POA: Diagnosis present

## 2022-10-19 DIAGNOSIS — D631 Anemia in chronic kidney disease: Secondary | ICD-10-CM | POA: Diagnosis not present

## 2022-10-19 DIAGNOSIS — Z7189 Other specified counseling: Secondary | ICD-10-CM | POA: Diagnosis not present

## 2022-10-19 DIAGNOSIS — K5900 Constipation, unspecified: Secondary | ICD-10-CM | POA: Diagnosis present

## 2022-10-19 DIAGNOSIS — N179 Acute kidney failure, unspecified: Secondary | ICD-10-CM | POA: Diagnosis present

## 2022-10-19 DIAGNOSIS — I5032 Chronic diastolic (congestive) heart failure: Secondary | ICD-10-CM | POA: Diagnosis present

## 2022-10-19 DIAGNOSIS — M109 Gout, unspecified: Secondary | ICD-10-CM | POA: Diagnosis present

## 2022-10-19 DIAGNOSIS — Z7401 Bed confinement status: Secondary | ICD-10-CM | POA: Diagnosis not present

## 2022-10-19 DIAGNOSIS — I130 Hypertensive heart and chronic kidney disease with heart failure and stage 1 through stage 4 chronic kidney disease, or unspecified chronic kidney disease: Secondary | ICD-10-CM | POA: Diagnosis present

## 2022-10-19 DIAGNOSIS — Z7902 Long term (current) use of antithrombotics/antiplatelets: Secondary | ICD-10-CM | POA: Diagnosis not present

## 2022-10-19 DIAGNOSIS — N184 Chronic kidney disease, stage 4 (severe): Secondary | ICD-10-CM | POA: Diagnosis present

## 2022-10-19 LAB — GLUCOSE, CAPILLARY
Glucose-Capillary: 133 mg/dL — ABNORMAL HIGH (ref 70–99)
Glucose-Capillary: 163 mg/dL — ABNORMAL HIGH (ref 70–99)
Glucose-Capillary: 222 mg/dL — ABNORMAL HIGH (ref 70–99)
Glucose-Capillary: 215 mg/dL — ABNORMAL HIGH (ref 70–99)

## 2022-10-19 LAB — BASIC METABOLIC PANEL
Anion gap: 7 (ref 5–15)
BUN: 48 mg/dL — ABNORMAL HIGH (ref 8–23)
CO2: 18 mmol/L — ABNORMAL LOW (ref 22–32)
Calcium: 7.8 mg/dL — ABNORMAL LOW (ref 8.9–10.3)
Chloride: 108 mmol/L (ref 98–111)
Creatinine, Ser: 3.39 mg/dL — ABNORMAL HIGH (ref 0.44–1.00)
GFR, Estimated: 13 mL/min — ABNORMAL LOW (ref >60)
Glucose, Bld: 158 mg/dL — ABNORMAL HIGH (ref 70–99)
Potassium: 3.9 mmol/L (ref 3.5–5.1)
Sodium: 133 mmol/L — ABNORMAL LOW (ref 135–145)

## 2022-10-19 LAB — CBC
HCT: 28.4 % — ABNORMAL LOW (ref 36.0–46.0)
Hemoglobin: 9.3 g/dL — ABNORMAL LOW (ref 12.0–15.0)
MCH: 29.6 pg (ref 26.0–34.0)
MCHC: 32.7 g/dL (ref 30.0–36.0)
MCV: 90.4 fL (ref 80.0–100.0)
Platelets: 148 10*3/uL — ABNORMAL LOW (ref 150–400)
RBC: 3.14 MIL/uL — ABNORMAL LOW (ref 3.87–5.11)
RDW: 14.5 % (ref 11.5–15.5)
WBC: 7.1 10*3/uL (ref 4.0–10.5)
nRBC: 0 % (ref 0.0–0.2)

## 2022-10-19 LAB — URINALYSIS, W/ REFLEX TO CULTURE (INFECTION SUSPECTED)
Bilirubin Urine: NEGATIVE
Glucose, UA: NEGATIVE mg/dL
Ketones, ur: NEGATIVE mg/dL
Nitrite: NEGATIVE
Protein, ur: 100 mg/dL — AB
Specific Gravity, Urine: 1.013 (ref 1.005–1.030)
pH: 5 (ref 5.0–8.0)

## 2022-10-19 MED ORDER — SODIUM BICARBONATE 650 MG PO TABS
650.0000 mg | ORAL_TABLET | Freq: Three times a day (TID) | ORAL | Status: DC
  Administered 2022-10-19 – 2022-10-23 (×12): 650 mg via ORAL
  Filled 2022-10-19 (×13): qty 1

## 2022-10-19 MED ORDER — PREDNISONE 20 MG PO TABS
40.0000 mg | ORAL_TABLET | Freq: Every day | ORAL | Status: DC
  Administered 2022-10-20 – 2022-10-21 (×2): 40 mg via ORAL
  Filled 2022-10-19 (×2): qty 2

## 2022-10-19 NOTE — Assessment & Plan Note (Signed)
Seems well-controlled with A1c of 6.1.  Patient was on Januvia at home -SSI

## 2022-10-19 NOTE — Assessment & Plan Note (Signed)
Hemoglobin decreased to 9.3 this morning, no obvious bleeding.  Worsening renal function can be contributory.  All cell lines decreased. -Monitor hemoglobin -Transfuse if below 7

## 2022-10-19 NOTE — Assessment & Plan Note (Signed)
No concern of exacerbation.  Clinically appears euvolemic and BNP normal at 37. 2D echo 10/12/2022 showed EF of 55-60 with grade 1 diastolic dysfunction.  -Monitor volume status closely

## 2022-10-19 NOTE — Assessment & Plan Note (Signed)
Most likely prerenal due to dehydration and continue taking Cozaar.  Patient was also using Voltaren gel very regularly.  Renal functions continue to get worse. Cozaar all and Voltaren gel was held -Nephrology consult -Giving some more gentle fluid -Monitor renal function -Avoid nephrotoxins

## 2022-10-19 NOTE — TOC Initial Note (Signed)
Transition of Care [facility]) - Initial/Assessment Note    Patient Details  Name: Alicia Garner MRN: [id number] Date of Birth: [birth year]  [facility]) CM/SW Contact:    Laurena Slimmer, RN Phone Number: 10/19/2022, 4:16 PM  Clinical Narrative:                 Attempt to reach patient's dauthgter's Tomeka and Sondra. No answer. Left message regarding discharge planning. Per therapy SNF LOC recommended.  FL2 completed bed search started.          Patient Goals and CMS Choice            Expected Discharge Plan and Services                                              Prior Living Arrangements/Services                       Activities of Daily Living Home Assistive Devices/Equipment: Eyeglasses, Wheelchair ADL Screening (condition at time of admission) Patient's cognitive ability adequate to safely complete daily activities?: Yes Is the patient deaf or have difficulty hearing?: No Does the patient have difficulty seeing, even when wearing glasses/contacts?: No Does the patient have difficulty concentrating, remembering, or making decisions?: No Patient able to express need for assistance with ADLs?: Yes Does the patient have difficulty dressing or bathing?: Yes Independently performs ADLs?: No Communication: Independent Dressing (OT): Needs assistance Is this a change from baseline?: Pre-admission baseline Grooming: Needs assistance Is this a change from baseline?: Pre-admission baseline Feeding: Independent Bathing: Needs assistance Is this a change from baseline?: Pre-admission baseline Toileting: Needs assistance Is this a change from baseline?: Pre-admission baseline In/Out Bed: Needs assistance Is this a change from baseline?: Pre-admission baseline Walks in Home: Needs assistance Is this a change from baseline?: Pre-admission baseline Does the patient have difficulty walking or climbing stairs?: Yes Weakness of Legs: Both Weakness  of Arms/Hands: Both  Permission Sought/Granted                  Emotional Assessment              Admission diagnosis:  Weakness [R53.1] AKI (acute kidney injury) ([place]) [N17.9] Acute renal failure superimposed on stage 4 chronic kidney disease ([place]) [N17.9, N18.4] Patient Active Problem List   Diagnosis Date Noted   Acute renal failure superimposed on stage 4 chronic kidney disease ([place]) 10/18/2022   HLD (hyperlipidemia) 10/18/2022   Chronic diastolic CHF (congestive heart failure) ([place]) 10/18/2022   Gout 10/18/2022   Type II diabetes mellitus with renal manifestations ([place]) 10/18/2022   Generalized weakness 10/18/2022   Normocytic anemia 10/18/2022   Bilateral foot pain 10/18/2022   CVA (cerebral vascular accident) ([place]) 10/11/2022   CKD (chronic kidney disease) stage 4, GFR 15-29 ml/min ([place]) 10/11/2022   Constipation 12/07/2021   Weakness 12/06/2021   Anemia of chronic disease 12/05/2021   Elevated liver function tests 12/04/2021   Pain in joint, multiple sites 12/04/2021   Type 2 diabetes mellitus with hyperlipidemia ([place]) 12/04/2021   Essential hypertension 12/04/2021   DNR (do not resuscitate) 12/04/2021   PCP:  [facility]:   [id number] - [place], [place] [address] Phone: [phone] Fax: [phone]     Social Determinants of Health (SDOH)  Social History: SDOH Screenings   Food Insecurity: No Food Insecurity (10/18/2022)  Housing: Low Risk  (10/18/2022)  Transportation Needs: No Transportation Needs (10/18/2022)  Utilities: Not At Risk (10/18/2022)  Tobacco Use: Low Risk  (10/18/2022)   SDOH Interventions:     Readmission Risk Interventions    12/05/2021    3:14 PM  Readmission Risk Prevention Plan  Transportation Screening Complete  PCP or Specialist Appt within 5-7 Days Complete  Home Care Screening Complete  Medication Review (RN CM) Complete

## 2022-10-19 NOTE — Evaluation (Signed)
Occupational Therapy Evaluation Patient Details Name: Alicia Garner MRN: [id number] DOB: Jun 05, 1941 Today's Date: 10/19/2022   History of Present Illness presented to ER secondary to generalized weakness, bilat foot pain; admitted for management of acute renal failure with stage 4 CKD.  Of note, recently hospitalized (1/22-24) due to acute CVA (R BG/internal capsule) infarct   Clinical Impression   Alicia Garner was seen for OT/PT co-evaluation this date. Prior to hospital admission, pt was not tolerating frequent mobility, required assist for all ADLs. Pt lives with daughter. Pt presents to acute OT demonstrating impaired ADL performance and functional mobility 2/2 decreased activity tolerance and functional strength/ROM/balance deficits. Pt currently requires MOD A x2 + RW bed>chair t/f, noted BM in standing. MAX A x2 + RW pericare standing, requires x2 standing bouts.  Pt would benefit from skilled OT to address noted impairments and functional limitations (see below for any additional details). Upon hospital discharge, recommend STR to maximize pt safety and return to PLOF.    Recommendations for follow up therapy are one component of a multi-disciplinary discharge planning process, led by the attending physician.  Recommendations may be updated based on patient status, additional functional criteria and insurance authorization.   Follow Up Recommendations  Skilled nursing-short term rehab (<3 hours/day)     Assistance Recommended at Discharge Frequent or constant Supervision/Assistance  Patient can return home with the following A lot of help with walking and/or transfers;A lot of help with bathing/dressing/bathroom;Assistance with cooking/housework;Direct supervision/assist for medications management;Assist for transportation;Direct supervision/assist for financial management;Help with stairs or ramp for entrance    Functional Status Assessment  Patient has had a recent decline in their  functional status and demonstrates the ability to make significant improvements in function in a reasonable and predictable amount of time.  Equipment Recommendations  None recommended by OT    Recommendations for Other Services       Precautions / Restrictions Precautions Precautions: Fall Restrictions Weight Bearing Restrictions: No      Mobility Bed Mobility               General bed mobility comments: received/left sitting    Transfers Overall transfer level: Needs assistance Equipment used: Rolling walker (2 wheels) Transfers: Sit to/from Stand, Bed to chair/wheelchair/BSC Sit to Stand: Mod assist, +2 physical assistance Stand pivot transfers: Max assist, +2 physical assistance                Balance Overall balance assessment: Needs assistance Sitting-balance support: No upper extremity supported, Feet supported Sitting balance-Leahy Scale: Fair   Postural control: Left lateral lean Standing balance support: Bilateral upper extremity supported Standing balance-Leahy Scale: Poor Standing balance comment: +2 with heavy L lateral lean                           ADL either performed or assessed with clinical judgement   ADL Overall ADL's : Needs assistance/impaired                                       General ADL Comments: MOD A x2 + RW for simualted BSC t/f and MAX A x2 + RW pericare standing, requires x2 standing bouts.      Pertinent Vitals/Pain Pain Assessment Pain Assessment: Faces Faces Pain Scale: Hurts a little bit Pain Location: bilat feet/ankles Pain Descriptors / Indicators: Guarding, Grimacing Pain Intervention(s): Limited activity within  patient's tolerance, Repositioned     Hand Dominance Right   Extremity/Trunk Assessment Upper Extremity Assessment Upper Extremity Assessment: Generalized weakness (L 3-/5, hx of cva)   Lower Extremity Assessment Lower Extremity Assessment: Generalized weakness        Communication Communication Communication: HOH   Cognition Arousal/Alertness: Awake/alert Behavior During Therapy: Flat affect Overall Cognitive Status: History of cognitive impairments - at baseline                                 General Comments: Oriented to self and location as hospital. follows 1 step commands with cues                Home Living Family/patient expects to be discharged to:: Private residence Living Arrangements: Children Available Help at Discharge: Family;Available PRN/intermittently Type of Home: House Home Access: Stairs to enter Entrance Stairs-Number of Steps: 2   Home Layout: One level               Home Equipment: Rollator (4 wheels);Grab bars - toilet;Shower Land (2 wheels);BSC/3in1   Additional Comments: Daughter works nights      Prior Functioning/Environment               Mobility Comments: Per chart, household AMB without device, earlier in year, used rollator when out of house. Pt reports she walks with a cane.  Has not been OOB much since recent discharge; Alicia Garner unable to start services during time home. ADLs Comments: assist for ADLs        OT Problem List: Decreased strength;Decreased range of motion;Impaired balance (sitting and/or standing);Decreased activity tolerance;Impaired UE functional use      OT Treatment/Interventions: Self-care/ADL training;Therapeutic exercise;Energy conservation;DME and/or AE instruction;Therapeutic activities;Balance training;Patient/family education    OT Goals(Current goals can be found in the care plan section) Acute Rehab OT Goals Patient Stated Goal: go home OT Goal Formulation: With patient Time For Goal Achievement: 11/02/22 Potential to Achieve Goals: Good ADL Goals Pt Will Perform Grooming: sitting;with set-up;with supervision Pt Will Perform Lower Body Dressing: with min assist;sit to/from stand Pt Will Transfer to Toilet: with min  assist;ambulating;bedside commode  OT Frequency: Min 2X/week    Co-evaluation PT/OT/SLP Co-Evaluation/Treatment: Yes Reason for Co-Treatment: For patient/therapist safety;To address functional/ADL transfers PT goals addressed during session: Mobility/safety with mobility OT goals addressed during session: ADL's and self-care      AM-PAC OT "6 Clicks" Daily Activity     Outcome Measure Help from another person eating meals?: A Little Help from another person taking care of personal grooming?: A Little Help from another person toileting, which includes using toliet, bedpan, or urinal?: A Lot Help from another person bathing (including washing, rinsing, drying)?: A Lot Help from another person to put on and taking off regular upper body clothing?: A Little Help from another person to put on and taking off regular lower body clothing?: A Lot 6 Click Score: 15   End of Session    Activity Tolerance: Patient tolerated treatment well Patient left: in chair;with call bell/phone within reach;with chair alarm set;with family/visitor present;with nursing/sitter in room  OT Visit Diagnosis: Unsteadiness on feet (R26.81);Other abnormalities of gait and mobility (R26.89);Muscle weakness (generalized) (M62.81);Other symptoms and signs involving cognitive function                Time: 1053-1105 OT Time Calculation (min): 12 min Charges:  OT General Charges $OT Visit: 1 Visit OT Evaluation $OT  Eval Low Complexity: 1 Low  Dessie Coma, M.S. OTR/L  10/19/22, 1:28 PM  ascom [phone]

## 2022-10-19 NOTE — Assessment & Plan Note (Signed)
-  Continue Crestor 

## 2022-10-19 NOTE — Assessment & Plan Note (Signed)
-  Continue aspirin, Plavix and Crestor

## 2022-10-19 NOTE — Consult Note (Signed)
[facility] Consult Note:10/19/22  Date of Admission:  10/18/2022           Reason for Consult:  AKI, CKD   Referring Provider: Lorella Nimrod, MD Primary Care Provider: Clinic, [facility]   History of Presenting Illness:  Alicia Garner is a 82 y.o. female with medical problems of diabetes, hypertension, hyperlipidemia, chronic kidney disease, stroke with left-sided weakness, anemia who now presents for generalized weakness and bilateral foot pain.  Patient was hospitalized from 1 22-1 24 for stroke with left-sided weakness and difficulty walking.  She returns for complaints of decreased p.o. intake.  Nephrology consult has been requested to evaluate acute kidney injury.At baseline her creatinine is 1.98/GFR 25 from 10/11/2022.  Creatinine has peaked at 3.40 which is today. Test for influenza and COVID-19 and RSV are negative. Urinalysis with large amount of leukocytes, proteinuria, 21-50 WBCs 2D echo from 10/12/2022 shows LVEF of 55 to 60%, mild LVH, grade 1 diastolic dysfunction No hypotensive episodes recorded since admission.  Today, she appears well.  She is sitting on the recliner chair when seen.  Daughter at bedside who helps with answering most of the questions.  She was able to eat half of her lunch today.  No leg edema.  No shortness of breath today.  Review of Systems: Review of Systems  Constitutional:  Positive for malaise/fatigue. Negative for chills and fever.  HENT:  Negative for hearing loss.   Eyes:  Negative for blurred vision and double vision.  Respiratory:  Negative for cough and hemoptysis.   Cardiovascular:  Negative for chest pain, palpitations and leg swelling.  Gastrointestinal:  Negative for heartburn, nausea and vomiting.  Genitourinary:  Negative for dysuria, frequency and urgency.  Musculoskeletal:  Negative for myalgias and neck pain.  Skin:  Negative for rash.  Neurological:  Positive for weakness.  Endo/Heme/Allergies:   Negative for environmental allergies.  Psychiatric/Behavioral:  The patient is nervous/anxious.     Past Medical History:  Diagnosis Date   Diabetes mellitus without complication ([place])    Hyperlipidemia    Hypertension    Stroke [facility])     Social History   Tobacco Use   Smoking status: Never   Smokeless tobacco: Never  Vaping Use   Vaping Use: Never used  Substance Use Topics   Alcohol use: Never   Drug use: Never    Family History  Problem Relation Age of Onset   Hypertension Mother    Diabetes Mother    Diabetes Father    Hypertension Father      OBJECTIVE: Blood pressure 120/71, pulse 85, temperature 98.5 F (36.9 C), resp. rate 20, height 5\' 4"  (1.626 m), weight 72.6 kg, SpO2 100 %.  Physical Exam  Lab Results Lab Results  Component Value Date   WBC 7.1 10/19/2022   HGB 9.3 (L) 10/19/2022   HCT 28.4 (L) 10/19/2022   MCV 90.4 10/19/2022   PLT 148 (L) 10/19/2022    Lab Results  Component Value Date   CREATININE 3.39 (H) 10/19/2022   BUN 48 (H) 10/19/2022   NA 133 (L) 10/19/2022   K 3.9 10/19/2022   CL 108 10/19/2022   CO2 18 (L) 10/19/2022    Lab Results  Component Value Date   ALT 23 10/18/2022   AST 28 10/18/2022   ALKPHOS 69 10/18/2022   BILITOT 0.9 10/18/2022     Microbiology: Recent Results (from the past 240 hour(s))  Resp panel by RT-PCR (RSV, Flu A&B, Covid) Anterior Nasal Swab  Status: None   Collection Time: 10/14/22 12:39 PM   Specimen: Anterior Nasal Swab  Result Value Ref Range Status   SARS Coronavirus 2 by RT PCR NEGATIVE NEGATIVE Final    Comment: (NOTE) SARS-CoV-2 target nucleic acids are NOT DETECTED.  The SARS-CoV-2 RNA is generally detectable in upper respiratory specimens during the acute phase of infection. The lowest concentration of SARS-CoV-2 viral copies this assay can detect is 138 copies/mL. A negative result does not preclude SARS-Cov-2 infection and should not be used as the sole basis for treatment  or other patient management decisions. A negative result may occur with  improper specimen collection/handling, submission of specimen other than nasopharyngeal swab, presence of viral mutation(s) within the areas targeted by this assay, and inadequate number of viral copies(<138 copies/mL). A negative result must be combined with clinical observations, patient history, and epidemiological information. The expected result is Negative.  Fact Sheet for Patients:  EntrepreneurPulse.com.au  Fact Sheet for Healthcare Providers:  IncredibleEmployment.be  This test is no t yet approved or cleared by the [place] FDA and  has been authorized for detection and/or diagnosis of SARS-CoV-2 by FDA under an Emergency Use Authorization (EUA). This EUA will remain  in effect (meaning this test can be used) for the duration of the COVID-19 declaration under Section 564(b)(1) of the Act, 21 U.S.C.section 360bbb-3(b)(1), unless the authorization is terminated  or revoked sooner.       Influenza A by PCR NEGATIVE NEGATIVE Final   Influenza B by PCR NEGATIVE NEGATIVE Final    Comment: (NOTE) The Xpert Xpress SARS-CoV-2/FLU/RSV plus assay is intended as an aid in the diagnosis of influenza from Nasopharyngeal swab specimens and should not be used as a sole basis for treatment. Nasal washings and aspirates are unacceptable for Xpert Xpress SARS-CoV-2/FLU/RSV testing.  Fact Sheet for Patients: EntrepreneurPulse.com.au  Fact Sheet for Healthcare Providers: IncredibleEmployment.be  This test is not yet approved or cleared by the [place] FDA and has been authorized for detection and/or diagnosis of SARS-CoV-2 by FDA under an Emergency Use Authorization (EUA). This EUA will remain in effect (meaning this test can be used) for the duration of the COVID-19 declaration under Section 564(b)(1) of the Act, 21 U.S.C. section  360bbb-3(b)(1), unless the authorization is terminated or revoked.     Resp Syncytial Virus by PCR NEGATIVE NEGATIVE Final    Comment: (NOTE) Fact Sheet for Patients: EntrepreneurPulse.com.au  Fact Sheet for Healthcare Providers: IncredibleEmployment.be  This test is not yet approved or cleared by the [place] FDA and has been authorized for detection and/or diagnosis of SARS-CoV-2 by FDA under an Emergency Use Authorization (EUA). This EUA will remain in effect (meaning this test can be used) for the duration of the COVID-19 declaration under Section 564(b)(1) of the Act, 21 U.S.C. section 360bbb-3(b)(1), unless the authorization is terminated or revoked.  Performed at [facility], [address] [place], [place] [address]   Resp panel by RT-PCR (RSV, Flu A&B, Covid) Anterior Nasal Swab     Status: None   Collection Time: 10/18/22  9:59 AM   Specimen: Anterior Nasal Swab  Result Value Ref Range Status   SARS Coronavirus 2 by RT PCR NEGATIVE NEGATIVE Final    Comment: (NOTE) SARS-CoV-2 target nucleic acids are NOT DETECTED.  The SARS-CoV-2 RNA is generally detectable in upper respiratory specimens during the acute phase of infection. The lowest concentration of SARS-CoV-2 viral copies this assay can detect is 138 copies/mL. A negative result does not  preclude SARS-Cov-2 infection and should not be used as the sole basis for treatment or other patient management decisions. A negative result may occur with  improper specimen collection/handling, submission of specimen other than nasopharyngeal swab, presence of viral mutation(s) within the areas targeted by this assay, and inadequate number of viral copies(<138 copies/mL). A negative result must be combined with clinical observations, patient history, and epidemiological information. The expected result is Negative.  Fact Sheet for Patients:   EntrepreneurPulse.com.au  Fact Sheet for Healthcare Providers:  IncredibleEmployment.be  This test is no t yet approved or cleared by the [place] FDA and  has been authorized for detection and/or diagnosis of SARS-CoV-2 by FDA under an Emergency Use Authorization (EUA). This EUA will remain  in effect (meaning this test can be used) for the duration of the COVID-19 declaration under Section 564(b)(1) of the Act, 21 U.S.C.section 360bbb-3(b)(1), unless the authorization is terminated  or revoked sooner.       Influenza A by PCR NEGATIVE NEGATIVE Final   Influenza B by PCR NEGATIVE NEGATIVE Final    Comment: (NOTE) The Xpert Xpress SARS-CoV-2/FLU/RSV plus assay is intended as an aid in the diagnosis of influenza from Nasopharyngeal swab specimens and should not be used as a sole basis for treatment. Nasal washings and aspirates are unacceptable for Xpert Xpress SARS-CoV-2/FLU/RSV testing.  Fact Sheet for Patients: EntrepreneurPulse.com.au  Fact Sheet for Healthcare Providers: IncredibleEmployment.be  This test is not yet approved or cleared by the [place] FDA and has been authorized for detection and/or diagnosis of SARS-CoV-2 by FDA under an Emergency Use Authorization (EUA). This EUA will remain in effect (meaning this test can be used) for the duration of the COVID-19 declaration under Section 564(b)(1) of the Act, 21 U.S.C. section 360bbb-3(b)(1), unless the authorization is terminated or revoked.     Resp Syncytial Virus by PCR NEGATIVE NEGATIVE Final    Comment: (NOTE) Fact Sheet for Patients: EntrepreneurPulse.com.au  Fact Sheet for Healthcare Providers: IncredibleEmployment.be  This test is not yet approved or cleared by the [place] FDA and has been authorized for detection and/or diagnosis of SARS-CoV-2 by FDA under an Emergency Use  Authorization (EUA). This EUA will remain in effect (meaning this test can be used) for the duration of the COVID-19 declaration under Section 564(b)(1) of the Act, 21 U.S.C. section 360bbb-3(b)(1), unless the authorization is terminated or revoked.  Performed at  [facility], [address] [place], [place] [address]     Medications: Scheduled Meds:  amLODipine  10 mg Oral Daily   aspirin EC  81 mg Oral Daily   clopidogrel  75 mg Oral Daily   cyanocobalamin  1,000 mcg Oral Daily   heparin  5,000 Units Subcutaneous Q8H   insulin aspart  0-5 Units Subcutaneous QHS   insulin aspart  0-9 Units Subcutaneous TID WC   [START ON 10/20/2022] predniSONE  40 mg Oral Q breakfast   rosuvastatin  40 mg Oral QPM   sodium bicarbonate  650 mg Oral TID   Continuous Infusions:  sodium chloride 75 mL/hr at 10/18/22 1737   PRN Meds:.acetaminophen, hydrALAZINE, ondansetron (ZOFRAN) IV, oxyCODONE-acetaminophen, polyethylene glycol  Allergies  Allergen Reactions   Lisinopril Swelling    Urinalysis: Recent Labs    10/18/22 1106  COLORURINE YELLOW*  LABSPEC 1.013  PHURINE 5.0  GLUCOSEU NEGATIVE  HGBUR MODERATE*  BILIRUBINUR NEGATIVE  KETONESUR NEGATIVE  PROTEINUR 100*  NITRITE NEGATIVE  LEUKOCYTESUR LARGE*      Imaging: CT HEAD WO CONTRAST (  5MM)  Result Date: 10/18/2022 CLINICAL DATA:  Provided history: Mental status change, unknown cause. Additional history provided: Generalized weakness. Prior stroke this month with persistent deficits on left side. EXAM: CT HEAD WITHOUT CONTRAST TECHNIQUE: Contiguous axial images were obtained from the base of the skull through the vertex without intravenous contrast. RADIATION DOSE REDUCTION: This exam was performed according to the departmental dose-optimization program which includes automated exposure control, adjustment of the mA and/or kV according to patient size and/or use of iterative reconstruction technique. COMPARISON:  Prior  head CT examinations 10/14/2022 and earlier. Brain MRI 10/11/2022. FINDINGS: Brain: Mild generalized parenchymal atrophy. A known recent infarct within the right basal ganglia/internal capsule was better appreciated on the prior brain MRI of 10/11/2022. Chronic lacunar infarcts within the bilateral cerebral hemispheric white matter and deep gray nuclei, many of which were better appreciated on the prior MRI. Background advanced chronic small-vessel ischemic changes within the cerebral white matter. There is no acute intracranial hemorrhage. No demarcated cortical infarct. No extra-axial fluid collection. No evidence of an intracranial mass. No midline shift. Vascular: No hyperdense vessel. Atherosclerotic calcifications. Skull: No fracture or aggressive osseous lesion. Sinuses/Orbits: No mass or acute finding within the imaged orbits. No significant paranasal sinus disease at the imaged levels. IMPRESSION: 1. A known recent infarct within the right basal ganglia/internal capsule was better appreciated on the prior brain MRI of 10/11/2022. 2. No evidence of an interval acute intracranial abnormality. 3. Background parenchymal atrophy and chronic small vessel ischemic disease, as described. Electronically Signed   By: Kellie Simmering D.O.   On: 10/18/2022 10:45      Assessment/Plan:  Shekia Kuper is a 82 y.o. female with medical problems of  diabetes, hypertension, hyperlipidemia, chronic kidney disease, stroke with left-sided weakness, anemia who now presents for generalized weakness and bilateral foot pain was admitted on 10/18/2022 for :  Weakness [R53.1] AKI (acute kidney injury) ([place]) [N17.9] Acute renal failure superimposed on stage 4 chronic kidney disease ([place]) [N17.9, N18.4]  Acute kidney injury Patient's baseline creatinine is 1.98/GFR 25 from October 11, 2022.  Urinalysis this admission does show moderate hemoglobin proteinuria and large amount of leukocytes consistent with possible UTI.  Urine  culture negative so far. The cause for acute kidney injury is not entirely clear but possibly related to ATN from hypotension caused by diarrhea in the setting of taking ARB (losartan). Plan: Agree with IV hydration Avoid nephrotoxins, IV contrast, nonsteroidals. Agree with holding losartan until renal function is recovered. Rate of rise of creatinine has slowed.  Will continue to monitor closely.  No acute indication for dialysis at present.  Chronic kidney disease stage IV Baseline creatinine 1.98/GFR 25 from 10/11/2022. Likely nephropathy from diabetes type 2 and hypertension, atherosclerosis and advanced age. ARB on hold.  Acute metabolic acidosis Agree with oral bicarbonate supplementation.  Hyponatremia Mild Likely due to renal failure. Expected to improve with improvement in renal function.      Chenoah Mcnally Candiss Norse 10/19/22

## 2022-10-19 NOTE — Assessment & Plan Note (Signed)
PT is recommending SNF

## 2022-10-19 NOTE — Assessment & Plan Note (Signed)
Concern of gout flare in feet.  Mildly elevated uric acid.  Pain improved with steroid. Home colchicine was held due to worsening renal function.  Procalcitonin 0.16 -Switch Solu-Medrol with prednisone -Continue with pain management

## 2022-10-19 NOTE — Hospital Course (Addendum)
Taken from H&P.   Alicia Garner is a 82 y.o. female with medical history significant of recent admission due to stroke with left-sided weakness, hypertension, hyperlipidemia, diabetes mellitus, CKD stage IV, anemia, who presents with generalized weakness, bilateral foot pain.   Patient was recently hospitalized due to stroke from 1/22 to 1/24.  Patient has left sided weakness and difficulty walking after stroke. Patient came back to the emergency room on 1/25 after being released on 1/24 where she was complained of decreased p.o. intake.  Patient was given 1 L of fluids and had improvement of her blood pressure and so she was discharged.   Today, pt comes back due to generalized weakness in the past 3 days, poor appetite and decreased oral intake.   Data reviewed independently and ED Course: pt was found to have WBC 8.3, lactic acid 12, negative PCR for COVID and flu.  Worsening renal function with creatinine 3.29, BUN 43, GFR 14 (recent baseline creatinine 2.43)., Oxygen saturation 96% on room air, temperature 99.6, blood pressure 115/70, heart rate 92, RR 15.  CT of head is negative for acute abnormality, but showed previous infarction . EKG: Sinus rhythm, QTc 466, T wave inversion only in lead III, LAD, poor R wave progression, LVH.   1/30: Vital stable.  Renal function continued to get worse despite getting some IV fluid.  Non anion gap metabolic acidosis with bicarb of 18 secondary to AKI and CKD.  Uric acid mildly elevated at 7.2.  BNP normal at 37.6.  Urinary output of 800 recorded.  Nephrology was consulted. PT is recommending SNF.  Patient with poor baseline functional status-high risk for deterioration and mortality. Palliative care care is also consulted

## 2022-10-19 NOTE — Progress Notes (Addendum)
Progress Note   Patient: Alicia Garner XTK:[id number] DOB: June 08, 1941 DOA: 10/18/2022     0 DOS: the patient was seen and examined on 10/19/2022   Brief hospital course: Taken from H&P.   Alicia Garner is a 82 y.o. female with medical history significant of recent admission due to stroke with left-sided weakness, hypertension, hyperlipidemia, diabetes mellitus, CKD stage IV, anemia, who presents with generalized weakness, bilateral foot pain.   Patient was recently hospitalized due to stroke from 1/22 to 1/24.  Patient has left sided weakness and difficulty walking after stroke. Patient came back to the emergency room on 1/25 after being released on 1/24 where she was complained of decreased p.o. intake.  Patient was given 1 L of fluids and had improvement of her blood pressure and so she was discharged.   Today, pt comes back due to generalized weakness in the past 3 days, poor appetite and decreased oral intake.   Data reviewed independently and ED Course: pt was found to have WBC 8.3, lactic acid 12, negative PCR for COVID and flu.  Worsening renal function with creatinine 3.29, BUN 43, GFR 14 (recent baseline creatinine 2.43)., Oxygen saturation 96% on room air, temperature 99.6, blood pressure 115/70, heart rate 92, RR 15.  CT of head is negative for acute abnormality, but showed previous infarction . EKG: Sinus rhythm, QTc 466, T wave inversion only in lead III, LAD, poor R wave progression, LVH.   1/30: Vital stable.  Renal function continued to get worse despite getting some IV fluid.  Non anion gap metabolic acidosis with bicarb of 18 secondary to AKI and CKD.  Uric acid mildly elevated at 7.2.  BNP normal at 37.6.  Urinary output of 800 recorded.  Nephrology was consulted. PT is recommending SNF.  Patient with poor baseline functional status-high risk for deterioration and mortality. Palliative care care is also consulted    Assessment and Plan: * Acute renal failure superimposed on  stage 4 chronic kidney disease ([name]) Most likely prerenal due to dehydration and continue taking Cozaar.  Patient was also using Voltaren gel very regularly.  Renal functions continue to get worse. Cozaar all and Voltaren gel was held -Nephrology consult -Giving some more gentle fluid -Monitor renal function -Avoid nephrotoxins  Bilateral foot pain -Please see above  Gout Concern of gout flare in feet.  Mildly elevated uric acid.  Pain improved with steroid. Home colchicine was held due to worsening renal function.  Procalcitonin 0.16 -Switch Solu-Medrol with prednisone -Continue with pain management  CVA (cerebral vascular accident) ([place]) -Continue aspirin, Plavix and Crestor  Generalized weakness PT is recommending SNF  Essential hypertension Blood pressure within goal. Holding home Cozaar due to worsening renal function. -As needed hydralazine  Chronic diastolic CHF (congestive heart failure) ([name]) No concern of exacerbation.  Clinically appears euvolemic and BNP normal at 37. 2D echo 10/12/2022 showed EF of 55-60 with grade 1 diastolic dysfunction.  -Monitor volume status closely   Type II diabetes mellitus with renal manifestations ([name]) Seems well-controlled with A1c of 6.1.  Patient was on Januvia at home -SSI  HLD (hyperlipidemia) -Continue Crestor  Normocytic anemia Hemoglobin decreased to 9.3 this morning, no obvious bleeding.  Worsening renal function can be contributory.  All cell lines decreased. -Monitor hemoglobin -Transfuse if below 7   Subjective: Patient was seen and examined today.  Denies any pain.  Oriented to self only.  P.o. intake remained poor.  No nausea or vomiting.  Physical Exam: Vitals:   10/18/22 1359  10/18/22 1400 10/18/22 1648 10/18/22 2313  BP: (!) 142/73  (!) 146/81 120/71  Pulse: (!) 102 92 86 85  Resp: 18  18 20   Temp: (!) 96.7 F (35.9 C) 98.4 F (36.9 C) 98.2 F (36.8 C) 98.5 F (36.9 C)  TempSrc: Oral     SpO2:   96%  100%  Weight: 72.6 kg     Height: 5\' 4"  (1.626 m)      General.  Chronically ill-appearing elderly lady, in no acute distress. Pulmonary.  Lungs clear bilaterally, normal respiratory effort. CV.  Regular rate and rhythm, no JVD, rub or murmur. Abdomen.  Soft, nontender, nondistended, BS positive. CNS.  Alert and oriented to self only.  No focal neurologic deficit. Extremities.  No edema, no cyanosis, pulses intact and symmetrical. Psychiatry.  Judgment and insight appears impaired.  Data Reviewed: Prior data reviewed  Family Communication: Discussed with daughter at bedside  Disposition: Status is: Inpatient Remains inpatient appropriate because: Severity of illness  Planned Discharge Destination: Skilled nursing facility  DVT prophylaxis.  Subcu heparin Time spent: 50 minutes  This record has been created using Systems analyst. Errors have been sought and corrected,but may not always be located. Such creation errors do not reflect on the standard of care.   Author: Lorella Nimrod, MD 10/19/2022 1:06 PM  For on call review [email or web address].

## 2022-10-19 NOTE — Evaluation (Signed)
Physical Therapy Evaluation Patient Details Name: Alicia Garner MRN: [id number] DOB: 1941/02/01 Today's Date: 10/19/2022  History of Present Illness  presented to ER secondary to generalized weakness, bilat foot pain; admitted for management of acute renal failure with stage 4 CKD.  Of note, recently hospitalized (1/22-24) due to acute CVA (R BG/internal capsule) infarct  Clinical Impression  Patient resting in bed upon arrival to room; daughter at bedside.  Patient alert oriented to self, general location as hospital; unaware of date, situation.  Initially did not recognize daughter (believed her to be sister), but corrected with cuing and increased time.  Patient follow simple verbal commands, but does require increased time for processing and task initiation. Demonstrates generalized weakness throughout L hemi-body, grossly 3-/5 (residual from recent CVA, consistent with documentation from previous admission).  Currently requiring mod assist for bed mobility; cga/close sup for unsupported sitting balance; mod assist +2 for sit/stand and standing balance with RW; mod/mas assist +2 for SPT (bed/chair) with RW.  Demonstrates heavy L lateral lean with standing, transfer attempts (absent awareness, attempts at correction); progressive hip/knee flexion with fatigue, limited anterior weight translation with all standing activities.  Recommend consistent +2 for all transfers/mobility at this time. Would benefit from skilled PT to address above deficits and promote optimal return to PLOF.; recommend transition to STR upon discharge from acute hospitalization.  If patient/family opt for transfer home, recommend max HH services (PT, OT, RN, aide), hospital bed, hoyer lift and 24/7 assist.     Recommendations for follow up therapy are one component of a multi-disciplinary discharge planning process, led by the attending physician.  Recommendations may be updated based on patient status, additional functional  criteria and insurance authorization.  Follow Up Recommendations Skilled nursing-short term rehab (<3 hours/day) Can patient physically be transported by private vehicle: No    Assistance Recommended at Discharge Frequent or constant Supervision/Assistance  Patient can return home with the following  Two people to help with walking and/or transfers;A lot of help with bathing/dressing/bathroom;Assist for transportation;Help with stairs or ramp for entrance;Direct supervision/assist for financial management;Direct supervision/assist for medications management    Equipment Recommendations  (if opt for return home, recommend hospital bed and hoyer lift)  Recommendations for Other Services       Functional Status Assessment Patient has had a recent decline in their functional status and demonstrates the ability to make significant improvements in function in a reasonable and predictable amount of time.     Precautions / Restrictions Precautions Precautions: Fall Restrictions Weight Bearing Restrictions: No      Mobility  Bed Mobility Overal bed mobility: Needs Assistance Bed Mobility: Supine to Sit     Supine to sit: Mod assist          Transfers Overall transfer level: Needs assistance Equipment used: Rolling walker (2 wheels) Transfers: Sit to/from Stand, Bed to chair/wheelchair/BSC Sit to Stand: Mod assist, +2 physical assistance Stand pivot transfers: Mod assist, Max assist, +2 physical assistance         General transfer comment: heavy L lateral lean with standing, transfer attempts (absent awareness, attempts at correction); progressive hip/knee flexion with fatigue, limited anteriro weight translation with all standing activities.    Ambulation/Gait               General Gait Details: unsafe/unable  Stairs            Wheelchair Mobility    Modified Rankin (Stroke Patients Only)       Balance Overall balance assessment:  Needs  assistance Sitting-balance support: No upper extremity supported, Feet supported Sitting balance-Leahy Scale: Fair   Postural control: Left lateral lean Standing balance support: Bilateral upper extremity supported Standing balance-Leahy Scale: Poor Standing balance comment: +2 with heavy L lateral lean                             Pertinent Vitals/Pain Pain Assessment Pain Assessment: Faces Faces Pain Scale: Hurts little more Pain Location: bilat feet/ankles Pain Descriptors / Indicators: Guarding, Grimacing Pain Intervention(s): Limited activity within patient's tolerance, Monitored during session, Repositioned    Home Living Family/patient expects to be discharged to:: Private residence Living Arrangements: Children Available Help at Discharge: Family;Available PRN/intermittently Type of Home: House Home Access: Stairs to enter   Entrance Stairs-Number of Steps: 2   Home Layout: One level Home Equipment: Rollator (4 wheels);Grab bars - toilet;Shower seat;Rolling Environmental consultant (2 wheels);BSC/3in1 Additional Comments: Daughter works nights    Prior Function               Mobility Comments: Per chart, household AMB without device, earlier in year, used rollator when out of house. Pt reports she walks with a cane.  Has not been OOB much since recent discharge; [place] unable to start services during time home.       Hand Dominance   Dominant Hand: Right    Extremity/Trunk Assessment   Upper Extremity Assessment Upper Extremity Assessment:  (L UE grossly 3-/5 throughout, mild inattention (?).  R UE grossly 4/5)    Lower Extremity Assessment Lower Extremity Assessment:  (L LE grossly at least 3-/5, R LE grossly 4/5)       Communication   Communication: HOH  Cognition Arousal/Alertness: Awake/alert Behavior During Therapy: Flat affect Overall Cognitive Status: History of cognitive impairments - at baseline                                  General Comments: Oriented to self and location as hospital; generally unaware of date, reason for admission and functional implications.  Follows simple commands, but limited recall of new information.  Limited task initiation, problem-solving and sequencing.        General Comments      Exercises Other Exercises Other Exercises: Sit/stand from recliner, mod assist +2 for lift off, standing balance; maintains forward flexed posture, L lateral lean.  Unsafe to attempt without +2 at all times.   Dep assist +2 for hygiene, linen change after incontinent BM during transfer   Assessment/Plan    PT Assessment Patient needs continued PT services  PT Problem List Decreased strength;Decreased knowledge of use of DME;Decreased range of motion;Decreased safety awareness;Decreased activity tolerance;Decreased knowledge of precautions;Decreased balance;Cardiopulmonary status limiting activity;Decreased mobility;Decreased coordination;Decreased cognition       PT Treatment Interventions DME instruction;Gait training;Stair training;Functional mobility training;Therapeutic activities;Therapeutic exercise;Balance training;Neuromuscular re-education;Cognitive remediation;Patient/family education;Wheelchair mobility training    PT Goals (Current goals can be found in the Care Plan section)  Acute Rehab PT Goals Patient Stated Goal: to return home PT Goal Formulation: With patient/family Time For Goal Achievement: 11/02/22 Potential to Achieve Goals: Fair    Frequency Min 2X/week     Co-evaluation PT/OT/SLP Co-Evaluation/Treatment: Yes Reason for Co-Treatment: Complexity of the patient's impairments (multi-system involvement);To address functional/ADL transfers PT goals addressed during session: Mobility/safety with mobility OT goals addressed during session: ADL's and self-care       AM-PAC PT "  6 Clicks" Mobility  Outcome Measure Help needed turning from your back to your side while in a  flat bed without using bedrails?: A Lot Help needed moving from lying on your back to sitting on the side of a flat bed without using bedrails?: A Lot Help needed moving to and from a bed to a chair (including a wheelchair)?: A Lot Help needed standing up from a chair using your arms (e.g., wheelchair or bedside chair)?: A Lot Help needed to walk in hospital room?: Total Help needed climbing 3-5 steps with a railing? : Total 6 Click Score: 10    End of Session Equipment Utilized During Treatment: Gait belt Activity Tolerance: Patient tolerated treatment well Patient left: in chair;with call bell/phone within reach;with chair alarm set;with nursing/sitter in room;with family/visitor present Nurse Communication: Mobility status;Need for lift equipment PT Visit Diagnosis: Difficulty in walking, not elsewhere classified (R26.2);Other abnormalities of gait and mobility (R26.89);Muscle weakness (generalized) (M62.81)    Time: 1040-1106 PT Time Calculation (min) (ACUTE ONLY): 26 min   Charges:   PT Evaluation $PT Eval Moderate Complexity: 1 Mod          Eliberto Sole H. Owens Shark, PT, DPT, NCS 10/19/22, 12:27 PM [phone]

## 2022-10-19 NOTE — Assessment & Plan Note (Signed)
-  Please see above

## 2022-10-19 NOTE — NC FL2 (Signed)
[place] LEVEL OF CARE FORM     IDENTIFICATION  Patient Name: Alicia Garner Birthdate: 02-13-1941 Sex: female Admission Date (Current Location): 10/18/2022  [facility] and [place] Number:  Engineering geologist and Address:  [facility], [address] [place], [place] [address]      Provider Number: [id number]  Attending Physician Name and Address:  Lorella Nimrod, MD  Relative Name and Phone Number:  Rica Mote, [phone]    Current Level of Care: Hospital Recommended Level of Care: [place] Prior Approval Number:    Date Approved/Denied:   PASRR Number: [id number]  Discharge Plan: SNF    Current Diagnoses: Patient Active Problem List   Diagnosis Date Noted   Acute renal failure superimposed on stage 4 chronic kidney disease ([place]) 10/18/2022   HLD (hyperlipidemia) 10/18/2022   Chronic diastolic CHF (congestive heart failure) ([place]) 10/18/2022   Gout 10/18/2022   Type II diabetes mellitus with renal manifestations ([place]) 10/18/2022   Generalized weakness 10/18/2022   Normocytic anemia 10/18/2022   Bilateral foot pain 10/18/2022   CVA (cerebral vascular accident) ([place]) 10/11/2022   CKD (chronic kidney disease) stage 4, GFR 15-29 ml/min ([place]) 10/11/2022   Constipation 12/07/2021   Weakness 12/06/2021   Anemia of chronic disease 12/05/2021   Elevated liver function tests 12/04/2021   Pain in joint, multiple sites 12/04/2021   Type 2 diabetes mellitus with hyperlipidemia ([place]) 12/04/2021   Essential hypertension 12/04/2021   DNR (do not resuscitate) 12/04/2021    Orientation RESPIRATION BLADDER Height & Weight     Self  Normal Incontinent Weight:  (72.6kg) Height:   (55'4')  BEHAVIORAL SYMPTOMS/MOOD NEUROLOGICAL BOWEL NUTRITION STATUS  Other (Comment) (n/a)  (n/a) Incontinent Diet (Heart Healthy)  AMBULATORY STATUS COMMUNICATION OF NEEDS Skin   Limited Assist Verbally Normal                        Personal Care Assistance Level of Assistance  Bathing, Dressing, Feeding Bathing Assistance: Limited assistance Feeding assistance: Limited assistance Dressing Assistance: Limited assistance     Functional Limitations Info             SPECIAL CARE FACTORS FREQUENCY  PT (By licensed PT), OT (By licensed OT)     PT Frequency: Min 2x weekly OT Frequency: Min 2x weekly            Contractures Contractures Info: Not present    Additional Factors Info  Code Status, Allergies Code Status Info: FULL Allergies Info: Lisinopril           Current Medications (10/19/2022):  This is the current hospital active medication list Current Facility-Administered Medications  Medication Dose Route Frequency Provider Last Rate Last Admin   0.9 %  sodium chloride infusion   Intravenous Continuous Ivor Costa, MD 75 mL/hr at 10/18/22 1737 Infusion Verify at 10/18/22 1737   acetaminophen (TYLENOL) tablet 650 mg  650 mg Oral Q6H PRN Ivor Costa, MD       amLODipine (NORVASC) tablet 10 mg  10 mg Oral Daily Ivor Costa, MD   10 mg at 10/19/22 1039   aspirin EC tablet 81 mg  81 mg Oral Daily Ivor Costa, MD   81 mg at 10/19/22 1039   clopidogrel (PLAVIX) tablet 75 mg  75 mg Oral Daily Ivor Costa, MD   75 mg at 10/19/22 1039   cyanocobalamin (VITAMIN B12) tablet 1,000 mcg  1,000 mcg Oral Daily Ivor Costa, MD  1,000 mcg at 10/19/22 1040   heparin injection 5,000 Units  5,000 Units Subcutaneous Q8H Ivor Costa, MD   5,000 Units at 10/19/22 1447   hydrALAZINE (APRESOLINE) injection 5 mg  5 mg Intravenous Q2H PRN Ivor Costa, MD       insulin aspart (novoLOG) injection 0-5 Units  0-5 Units Subcutaneous QHS Ivor Costa, MD   2 Units at 10/18/22 2256   insulin aspart (novoLOG) injection 0-9 Units  0-9 Units Subcutaneous TID WC Ivor Costa, MD   1 Units at 10/19/22 1045   ondansetron (ZOFRAN) injection 4 mg  4 mg Intravenous Q8H PRN Ivor Costa, MD       oxyCODONE-acetaminophen (PERCOCET/ROXICET) 5-325 MG  per tablet 1 tablet  1 tablet Oral Q4H PRN Ivor Costa, MD       polyethylene glycol (MIRALAX / GLYCOLAX) packet 17 g  17 g Oral Daily PRN Ivor Costa, MD       Derrill Memo ON 10/20/2022] predniSONE (DELTASONE) tablet 40 mg  40 mg Oral Q breakfast Lorella Nimrod, MD       rosuvastatin (CRESTOR) tablet 40 mg  40 mg Oral QPM Ivor Costa, MD   40 mg at 10/18/22 1725   sodium bicarbonate tablet 650 mg  650 mg Oral TID Lorella Nimrod, MD         Discharge Medications: Please see discharge summary for a list of discharge medications.  Relevant Imaging Results:  Relevant Lab Results:   Additional Information SS# [id number]  Laurena Slimmer, RN

## 2022-10-19 NOTE — Assessment & Plan Note (Signed)
Blood pressure within goal. Holding home Cozaar due to worsening renal function. -As needed hydralazine

## 2022-10-20 ENCOUNTER — Inpatient Hospital Stay: Payer: Medicare HMO

## 2022-10-20 DIAGNOSIS — N179 Acute kidney failure, unspecified: Secondary | ICD-10-CM | POA: Diagnosis not present

## 2022-10-20 DIAGNOSIS — N184 Chronic kidney disease, stage 4 (severe): Secondary | ICD-10-CM | POA: Diagnosis not present

## 2022-10-20 LAB — CBC
HCT: 26.5 % — ABNORMAL LOW (ref 36.0–46.0)
Hemoglobin: 8.7 g/dL — ABNORMAL LOW (ref 12.0–15.0)
MCH: 29.6 pg (ref 26.0–34.0)
MCHC: 32.8 g/dL (ref 30.0–36.0)
MCV: 90.1 fL (ref 80.0–100.0)
Platelets: 151 10*3/uL (ref 150–400)
RBC: 2.94 MIL/uL — ABNORMAL LOW (ref 3.87–5.11)
RDW: 14.3 % (ref 11.5–15.5)
WBC: 8.6 10*3/uL (ref 4.0–10.5)
nRBC: 0 % (ref 0.0–0.2)

## 2022-10-20 LAB — GLUCOSE, CAPILLARY
Glucose-Capillary: 118 mg/dL — ABNORMAL HIGH (ref 70–99)
Glucose-Capillary: 226 mg/dL — ABNORMAL HIGH (ref 70–99)
Glucose-Capillary: 210 mg/dL — ABNORMAL HIGH (ref 70–99)
Glucose-Capillary: 168 mg/dL — ABNORMAL HIGH (ref 70–99)

## 2022-10-20 LAB — BASIC METABOLIC PANEL
Anion gap: 4 — ABNORMAL LOW (ref 5–15)
BUN: 64 mg/dL — ABNORMAL HIGH (ref 8–23)
CO2: 16 mmol/L — ABNORMAL LOW (ref 22–32)
Calcium: 7.5 mg/dL — ABNORMAL LOW (ref 8.9–10.3)
Chloride: 116 mmol/L — ABNORMAL HIGH (ref 98–111)
Creatinine, Ser: 3.98 mg/dL — ABNORMAL HIGH (ref 0.44–1.00)
GFR, Estimated: 11 mL/min — ABNORMAL LOW (ref >60)
Glucose, Bld: 127 mg/dL — ABNORMAL HIGH (ref 70–99)
Potassium: 4.4 mmol/L (ref 3.5–5.1)
Sodium: 136 mmol/L (ref 135–145)

## 2022-10-20 MED ORDER — STERILE WATER FOR INJECTION IV SOLN
INTRAVENOUS | Status: DC
  Filled 2022-10-20: qty 150
  Filled 2022-10-20 (×2): qty 1000

## 2022-10-20 MED ORDER — ADULT MULTIVITAMIN W/MINERALS CH
1.0000 | ORAL_TABLET | Freq: Every day | ORAL | Status: DC
  Administered 2022-10-20 – 2022-10-26 (×7): 1 via ORAL
  Filled 2022-10-20 (×7): qty 1

## 2022-10-20 MED ORDER — ENSURE ENLIVE PO LIQD
237.0000 mL | Freq: Three times a day (TID) | ORAL | Status: DC
  Administered 2022-10-20 – 2022-10-26 (×18): 237 mL via ORAL

## 2022-10-20 NOTE — TOC Progression Note (Signed)
Transition of Care [facility]) - Progression Note    Patient Details  Name: Ghazal Pevey MRN: [id number] Date of Birth: [birth year]  [facility]) CM/SW [place], RN Phone Number: 10/20/2022, 10:32 AM  Clinical Narrative:    Ins auth approved 1/31-2/2 ref id [id number]   Expected Discharge Plan: Skilled Nursing Facility Barriers to Discharge: Insurance Authorization  Expected Discharge Plan and Services                                               Social Determinants of Health (SDOH) Interventions SDOH Screenings   Food Insecurity: No Food Insecurity (10/18/2022)  Housing: Low Risk  (10/18/2022)  Transportation Needs: No Transportation Needs (10/18/2022)  Utilities: Not At Risk (10/18/2022)  Tobacco Use: Low Risk  (10/18/2022)    Readmission Risk Interventions    12/05/2021    3:14 PM  Readmission Risk Prevention Plan  Transportation Screening Complete  PCP or Specialist Appt within 5-7 Days Complete  Home Care Screening Complete  Medication Review (RN CM) Complete

## 2022-10-20 NOTE — Progress Notes (Signed)
Initial Nutrition Assessment  DOCUMENTATION CODES:   Not applicable  INTERVENTION:   -Liberalize diet to regular for wider variety of meal selections -MVI with minerals daily -Ensure Enlive po TID, each supplement provides 350 kcal and 20 grams of protein -Feeding assistance with meals -Obtain new wts  NUTRITION DIAGNOSIS:   Inadequate oral intake related to poor appetite as evidenced by meal completion < 25%, per patient/family report.  GOAL:   Patient will meet greater than or equal to 90% of their needs  MONITOR:   PO intake, Supplement acceptance  REASON FOR ASSESSMENT:   Consult Assessment of nutrition requirement/status  ASSESSMENT:   Pt with medical history significant of recent admission due to stroke with left-sided weakness, hypertension, hyperlipidemia, diabetes mellitus, CKD stage IV, anemia, who presents with generalized weakness, bilateral foot pain.  Pt admitted with acute renal failure superimposed on stage 4 chronic kidney disease.   Reviewed I/O's: -700 ml x 24 hours and +274 ml since admission  UOP: 700 ml x 24 hours.    Per MD notes, pt has had poor appetite and decreased oral intake over the past 3 days.   Pt oriented to self only and unable to provide history.   Spoke with nurse tech; pt being prepared to go down to ultrasound.   Observed lunch tray, which was untouched.   Reviewed wt hx. Unsure if pt has lost weight. Suspect weights are started weights.   Palliative care consulted for goals of care discussions.   Medications reviewed and include vitamin B-12 and prednisone,   Lab Results  Component Value Date   HGBA1C 6.1 (H) 10/12/2022   PTA DM medications are 25 mg [place] daily.   Labs reviewed: CBGS: 118-226 (inpatient orders for glycemic control are 0-5 units insulin aspart daily at bedtime and 0-9 units insulin aspart TID with meals).    NUTRITION - FOCUSED PHYSICAL EXAM:  Flowsheet Row Most Recent Value  Orbital Region No  depletion  Upper Arm Region No depletion  Thoracic and Lumbar Region No depletion  Buccal Region No depletion  Temple Region No depletion  Clavicle Bone Region No depletion  Clavicle and Acromion Bone Region No depletion  Scapular Bone Region No depletion  Dorsal Hand No depletion  Patellar Region No depletion  Anterior Thigh Region No depletion  Posterior Calf Region No depletion  Edema (RD Assessment) None  Hair Reviewed  Eyes Reviewed  Mouth Reviewed  Skin Reviewed  Nails Reviewed       Diet Order:   Diet Order             Diet heart healthy/carb modified Fluid consistency: Thin  Diet effective now                   EDUCATION NEEDS:   Not appropriate for education at this time  Skin:  Skin Assessment: Reviewed RN Assessment  Last BM:  10/20/22 (type 6)  Height:   Ht Readings from Last 1 Encounters:  10/18/22 5\' 4"  (1.626 m)    Weight:   Wt Readings from Last 1 Encounters:  10/18/22 72.6 kg    Ideal Body Weight:  54.5 kg  BMI:  Body mass index is 27.47 kg/m.  Estimated Nutritional Needs:   Kcal:  1650-1850  Protein:  85-100 grams  Fluid:  > 1.6 L    Loistine Chance, RD, LDN, [place] Registered Dietitian II Certified Diabetes Care and Education Specialist Please refer to [facility] for RD and/or RD on-call/weekend/after hours pager

## 2022-10-20 NOTE — Progress Notes (Addendum)
Progress Note   Patient: Alicia Garner NFA:[id number] DOB: [birth year] DOA: 10/18/2022     1 DOS: the patient was seen and examined on 10/20/2022   Brief hospital course: Taken from H&P.    Yoshika Vensel is a 82 y.o. female with medical history significant of recent admission due to stroke with left-sided weakness, hypertension, hyperlipidemia, diabetes mellitus, CKD stage IV, anemia, who presents with generalized weakness, bilateral foot pain.   Patient was recently hospitalized due to stroke from 1/22 to 1/24.  Patient has left sided weakness and difficulty walking after stroke. Patient came back to the emergency room on 1/25 after being released on 1/24 where she was complained of decreased p.o. intake.  Patient was given 1 L of fluids and had improvement of her blood pressure and so she was discharged.   Today, pt comes back due to generalized weakness in the past 3 days, poor appetite and decreased oral intake.    Data reviewed independently and ED Course: pt was found to have WBC 8.3, lactic acid 12, negative PCR for COVID and flu.  Worsening renal function with creatinine 3.29, BUN 43, GFR 14 (recent baseline creatinine 2.43)., Oxygen saturation 96% on room air, temperature 99.6, blood pressure 115/70, heart rate 92, RR 15.  CT of head is negative for acute abnormality, but showed previous infarction . EKG: Sinus rhythm, QTc 466, T wave inversion only in lead III, LAD, poor R wave progression, LVH.    1/30: Vital stable.  Renal function continued to get worse despite getting some IV fluid.  Non anion gap metabolic acidosis with bicarb of 18 secondary to AKI and CKD.  Uric acid mildly elevated at 7.2.  BNP normal at 37.6.  Urinary output of 800 recorded.  Nephrology was consulted. PT is recommending SNF.   Patient with poor baseline functional status-high risk for deterioration and mortality. Palliative care care is also consulted   Assessment and Plan: * Acute renal failure superimposed on  stage 4 chronic kidney disease ([place]) Most likely prerenal due to dehydration and continue taking Cozaar.  Patient was also using Voltaren gel very regularly.  Renal functions continue to get worse. Cozaar all and Voltaren gel was held -Nephrology consulted -Giving some more gentle fluid with sodium bicarb given metabolic acidosis -Monitor renal function -Avoid nephrotoxins - will check renal u/s today  Bilateral foot pain -Please see above  Gout Concern of gout flare in feet.  Mildly elevated uric acid.  Pain improved with steroid. Home colchicine was held due to worsening renal function.  Procalcitonin 0.16 - continue prednisone (steroids started 1/29), would plan on weaning around day 5 -Continue with pain management  CVA (cerebral vascular accident) [facility]) Recent diagnosis -Continue aspirin, Plavix and Crestor  Generalized weakness PT is recommending SNF, has a bed and insurance auth  Essential hypertension Blood pressure within goal. Holding home Cozaar due to worsening renal function. -As needed hydralazine  Chronic diastolic CHF (congestive heart failure) ([name]) No concern of exacerbation.  Clinically appears euvolemic and BNP normal at 37. 2D echo 10/12/2022 showed EF of 55-60 with grade 1 diastolic dysfunction.  -Monitor volume status closely  Type II diabetes mellitus with renal manifestations ([name]) Seems well-controlled with A1c of 6.1.  Patient was on Januvia at home -SSI  HLD (hyperlipidemia) -Continue Crestor  Normocytic anemia Hemoglobin decreased to 8.7 this morning, no obvious bleeding.  Worsening renal function likely the culprit.  All cell lines decreased. -Monitor hemoglobin -Transfuse if below 7   Subjective: Patient was  seen and examined today.  Denies any pain.  Oriented to self only.  Denies pain, ate half of lunch.  Physical Exam: Vitals:   10/18/22 2313 10/19/22 1534 10/20/22 0047 10/20/22 0803  BP: 120/71 133/72 119/71 123/63  Pulse: 85 100  82 78  Resp: 20 18 16 20   Temp: 98.5 F (36.9 C) 97.9 F (36.6 C) 97.8 F (36.6 C) 98.4 F (36.9 C)  TempSrc:      SpO2: 100% 98% 96% 98%  Weight:      Height:       General.  Chronically ill-appearing elderly lady, in no acute distress. Pulmonary.  Lungs clear bilaterally, normal respiratory effort. CV.  Regular rate and rhythm, no JVD, rub or murmur. Abdomen.  Soft, nontender, nondistended, BS positive. CNS.  Alert and oriented to self only.  No focal neurologic deficit. Extremities.  No edema, no cyanosis, pulses intact and symmetrical. Psychiatry.  Judgment and insight appears impaired.  Data Reviewed: Prior data reviewed  Family Communication: Discussed with daughter tameka telephonically 1/31  Disposition: Status is: Inpatient Remains inpatient appropriate because: Severity of illness  Planned Discharge Destination: Skilled nursing facility  DVT prophylaxis.  Subcu heparin Time spent: 30  Author: Desma Maxim, MD 10/20/2022 1:23 PM  For on call review [email or web address].

## 2022-10-20 NOTE — Progress Notes (Signed)
[facility]  ROUNDING NOTE   Subjective:  Patient with past medical history of diabetes mellitus type 2, hypertension, per lipidemia, chronic kidney disease stage IV, CVA with left-sided weakness, anemia of chronic kidney disease who came in with generalized weakness and bilateral foot pain.  She was hospitalized in late January for CVA with left-sided weakness with difficulties with walking.  Creatinine remains significantly above baseline.  Baseline creatinine 1.91 EGFR 25 from 10/11/2022.   Objective:  Vital signs in last 24 hours:  Temp:  [97.8 F (36.6 C)-98.4 F (36.9 C)] 98.4 F (36.9 C) (01/31 0803) Pulse Rate:  [78-100] 78 (01/31 0803) Resp:  [16-20] 20 (01/31 0803) BP: (119-133)/(63-72) 123/63 (01/31 0803) SpO2:  [96 %-98 %] 98 % (01/31 0803)  Weight change:  Filed Weights   10/18/22 1359  Weight: 72.6 kg    Intake/Output: I/O last 3 completed shifts: In: 803.8 [P.O.:100; I.V.:703.8] Out: 1500 [Urine:1500]   Intake/Output this shift:  No intake/output data recorded.  Physical Exam: General: No acute distress  Head: Normocephalic, atraumatic. Moist oral mucosal membranes  Neck: Supple  Lungs:  Clear to auscultation, normal effort  Heart: S1S2 no rubs  Abdomen:  Soft, nontender, bowel sounds present  Extremities: Trace peripheral edema.  Neurologic: Awake, alert, following commands  Skin: No acute rash  Access: No hemodialysis access    Basic Metabolic Panel: Recent Labs  Lab 10/14/22 1054 10/18/22 0954 10/19/22 0228 10/20/22 0353  NA 138 138 133* 136  K 4.5 4.0 3.9 4.4  CL 109 106 108 116*  CO2 23 18* 18* 16*  GLUCOSE 138* 128* 158* 127*  BUN 34* 43* 48* 64*  CREATININE 2.43* 3.28* 3.39* 3.98*  CALCIUM 8.9 8.7* 7.8* 7.5*    Liver Function Tests: Recent Labs  Lab 10/14/22 1054 10/18/22 0954  AST 22 28  ALT 11 23  ALKPHOS 56 69  BILITOT 0.9 0.9  PROT 7.8 8.0  ALBUMIN 3.3* 3.3*   No results for input(s): "LIPASE",  "AMYLASE" in the last 168 hours. No results for input(s): "AMMONIA" in the last 168 hours.  CBC: Recent Labs  Lab 10/14/22 1054 10/18/22 0954 10/19/22 0228 10/20/22 0353  WBC 8.4 8.3 7.1 8.6  NEUTROABS  --  5.6  --   --   HGB 11.4* 10.9* 9.3* 8.7*  HCT 35.9* 33.9* 28.4* 26.5*  MCV 94.2 93.4 90.4 90.1  PLT 153 171 148* 151    Cardiac Enzymes: No results for input(s): "CKTOTAL", "CKMB", "CKMBINDEX", "TROPONINI" in the last 168 hours.  BNP: Invalid input(s): "POCBNP"  CBG: Recent Labs  Lab 10/19/22 1110 10/19/22 1626 10/19/22 2123 10/20/22 0805 10/20/22 1232  GLUCAP 163* 222* 215* 118* 52*    Microbiology: Results for orders placed or performed during the hospital encounter of 10/18/22  Resp panel by RT-PCR (RSV, Flu A&B, Covid) Anterior Nasal Swab     Status: None   Collection Time: 10/18/22  9:59 AM   Specimen: Anterior Nasal Swab  Result Value Ref Range Status   SARS Coronavirus 2 by RT PCR NEGATIVE NEGATIVE Final    Comment: (NOTE) SARS-CoV-2 target nucleic acids are NOT DETECTED.  The SARS-CoV-2 RNA is generally detectable in upper respiratory specimens during the acute phase of infection. The lowest concentration of SARS-CoV-2 viral copies this assay can detect is 138 copies/mL. A negative result does not preclude SARS-Cov-2 infection and should not be used as the sole basis for treatment or other patient management decisions. A negative result may occur with  improper  specimen collection/handling, submission of specimen other than nasopharyngeal swab, presence of viral mutation(s) within the areas targeted by this assay, and inadequate number of viral copies(<138 copies/mL). A negative result must be combined with clinical observations, patient history, and epidemiological information. The expected result is Negative.  Fact Sheet for Patients:  EntrepreneurPulse.com.au  Fact Sheet for Healthcare Providers:   IncredibleEmployment.be  This test is no t yet approved or cleared by the [place] FDA and  has been authorized for detection and/or diagnosis of SARS-CoV-2 by FDA under an Emergency Use Authorization (EUA). This EUA will remain  in effect (meaning this test can be used) for the duration of the COVID-19 declaration under Section 564(b)(1) of the Act, 21 U.S.C.section 360bbb-3(b)(1), unless the authorization is terminated  or revoked sooner.       Influenza A by PCR NEGATIVE NEGATIVE Final   Influenza B by PCR NEGATIVE NEGATIVE Final    Comment: (NOTE) The Xpert Xpress SARS-CoV-2/FLU/RSV plus assay is intended as an aid in the diagnosis of influenza from Nasopharyngeal swab specimens and should not be used as a sole basis for treatment. Nasal washings and aspirates are unacceptable for Xpert Xpress SARS-CoV-2/FLU/RSV testing.  Fact Sheet for Patients: EntrepreneurPulse.com.au  Fact Sheet for Healthcare Providers: IncredibleEmployment.be  This test is not yet approved or cleared by the [place] FDA and has been authorized for detection and/or diagnosis of SARS-CoV-2 by FDA under an Emergency Use Authorization (EUA). This EUA will remain in effect (meaning this test can be used) for the duration of the COVID-19 declaration under Section 564(b)(1) of the Act, 21 U.S.C. section 360bbb-3(b)(1), unless the authorization is terminated or revoked.     Resp Syncytial Virus by PCR NEGATIVE NEGATIVE Final    Comment: (NOTE) Fact Sheet for Patients: EntrepreneurPulse.com.au  Fact Sheet for Healthcare Providers: IncredibleEmployment.be  This test is not yet approved or cleared by the [place] FDA and has been authorized for detection and/or diagnosis of SARS-CoV-2 by FDA under an Emergency Use Authorization (EUA). This EUA will remain in effect (meaning this test can be used) for  the duration of the COVID-19 declaration under Section 564(b)(1) of the Act, 21 U.S.C. section 360bbb-3(b)(1), unless the authorization is terminated or revoked.  Performed at [facility], [address] [place], [place] [address]   Urine Culture     Status: Abnormal (Preliminary result)   Collection Time: 10/18/22 11:06 AM   Specimen: Urine, Clean Catch  Result Value Ref Range Status   Specimen Description   Final    URINE, CLEAN CATCH Performed at [facility], [place] [address], [place], [place] [address]    Special Requests   Final    NONE Reflexed from [id number] Performed at [facility], [place], [place] [address]    Culture (A)  Final    50,000 COLONIES/mL PROTEUS MIRABILIS SUSCEPTIBILITIES TO FOLLOW Performed at [facility], [place] [address], [place], [place] [address]    Report Status PENDING  Incomplete    Coagulation Studies: No results for input(s): "LABPROT", "INR" in the last 72 hours.  Urinalysis: Recent Labs    10/18/22 1106  COLORURINE YELLOW*  LABSPEC 1.013  PHURINE 5.0  GLUCOSEU NEGATIVE  HGBUR MODERATE*  BILIRUBINUR NEGATIVE  KETONESUR NEGATIVE  PROTEINUR 100*  NITRITE NEGATIVE  LEUKOCYTESUR LARGE*      Imaging: No results found.   Medications:    sodium bicarbonate 150 mEq in sterile water 1,150 mL infusion 75 mL/hr at  10/20/22 1230    amLODipine  10 mg Oral Daily   aspirin EC  81 mg Oral Daily   clopidogrel  75 mg Oral Daily   cyanocobalamin  1,000 mcg Oral Daily   heparin  5,000 Units Subcutaneous Q8H   insulin aspart  0-5 Units Subcutaneous QHS   insulin aspart  0-9 Units Subcutaneous TID WC   predniSONE  40 mg Oral Q breakfast   rosuvastatin  40 mg Oral QPM   sodium bicarbonate  650 mg Oral TID   acetaminophen, hydrALAZINE, ondansetron (ZOFRAN) IV, oxyCODONE-acetaminophen, polyethylene glycol  Assessment/ Plan:  82 y.o. female with past medical history of diabetes mellitus type 2,  hypertension, lipidemia, chronic kidney disease stage IV, CVA with left-sided weakness, anemia chronic kidney disease who presented with generalized weakness and bilateral foot pain.  1.  Acute kidney injury/chronic kidney disease stage IV baseline creatinine 1.98 with EGFR of 25.  Chronic kidney disease likely secondary to diabetic nephropathy.  Acute kidney injury now may be due to acute tubular necrosis from relative hypotension.  Patient switched to sodium bicarb infusion given ongoing acute metabolic acidosis.  No immediate need for dialysis but this may be a possibility if renal function continues to deteriorate.  Discussed with patient's daughter.  2.  Acute metabolic acidosis.  Serum bicarbonate down to 16.  Patient started on sodium bicarb infusion at 75 cc/h.  3.  Anemia of chronic kidney disease.  Hemoglobin worsening at 8.7.  No urgent indication for Epogen but may need to consider this over the course of the hospitalization.   LOS: 1 Teon Hudnall 1/31/20241:08 PM

## 2022-10-20 NOTE — TOC Progression Note (Signed)
Transition of Care [facility]) - Progression Note    Patient Details  Name: Kynlea Blackston MRN: [id number] Date of Birth: 05-03-1941  Transition of Care [facility]) CM/SW [place], RN Phone Number: 10/20/2022, 9:56 AM  Clinical Narrative:    Talked with the patient's daughter [name] Reviewed the bed offers her, she chose [facility], I explained we would start the insurance process She stated understanding        Expected Discharge Plan and Services                                               Social Determinants of Health ([place]) Interventions SDOH Screenings   Food Insecurity: No Food Insecurity (10/18/2022)  Housing: Low Risk  (10/18/2022)  Transportation Needs: No Transportation Needs (10/18/2022)  Utilities: Not At Risk (10/18/2022)  Tobacco Use: Low Risk  (10/18/2022)    Readmission Risk Interventions    12/05/2021    3:14 PM  Readmission Risk Prevention Plan  Transportation Screening Complete  PCP or Specialist Appt within 5-7 Days Complete  Home Care Screening Complete  Medication Review (RN CM) Complete

## 2022-10-21 DIAGNOSIS — I6339 Cerebral infarction due to thrombosis of other cerebral artery: Secondary | ICD-10-CM

## 2022-10-21 DIAGNOSIS — Z515 Encounter for palliative care: Secondary | ICD-10-CM

## 2022-10-21 DIAGNOSIS — Z7189 Other specified counseling: Secondary | ICD-10-CM | POA: Diagnosis not present

## 2022-10-21 DIAGNOSIS — N179 Acute kidney failure, unspecified: Secondary | ICD-10-CM | POA: Diagnosis not present

## 2022-10-21 DIAGNOSIS — N184 Chronic kidney disease, stage 4 (severe): Secondary | ICD-10-CM | POA: Diagnosis not present

## 2022-10-21 DIAGNOSIS — Z66 Do not resuscitate: Secondary | ICD-10-CM

## 2022-10-21 LAB — CBC
HCT: 24 % — ABNORMAL LOW (ref 36.0–46.0)
Hemoglobin: 8.1 g/dL — ABNORMAL LOW (ref 12.0–15.0)
MCH: 30.2 pg (ref 26.0–34.0)
MCHC: 33.8 g/dL (ref 30.0–36.0)
MCV: 89.6 fL (ref 80.0–100.0)
Platelets: 142 10*3/uL — ABNORMAL LOW (ref 150–400)
RBC: 2.68 MIL/uL — ABNORMAL LOW (ref 3.87–5.11)
RDW: 14.6 % (ref 11.5–15.5)
WBC: 8.2 10*3/uL (ref 4.0–10.5)
nRBC: 0 % (ref 0.0–0.2)

## 2022-10-21 LAB — BASIC METABOLIC PANEL
Anion gap: 6 (ref 5–15)
BUN: 78 mg/dL — ABNORMAL HIGH (ref 8–23)
CO2: 23 mmol/L (ref 22–32)
Calcium: 7.5 mg/dL — ABNORMAL LOW (ref 8.9–10.3)
Chloride: 110 mmol/L (ref 98–111)
Creatinine, Ser: 3.99 mg/dL — ABNORMAL HIGH (ref 0.44–1.00)
GFR, Estimated: 11 mL/min — ABNORMAL LOW (ref >60)
Glucose, Bld: 190 mg/dL — ABNORMAL HIGH (ref 70–99)
Potassium: 4 mmol/L (ref 3.5–5.1)
Sodium: 139 mmol/L (ref 135–145)

## 2022-10-21 LAB — GLUCOSE, CAPILLARY
Glucose-Capillary: 131 mg/dL — ABNORMAL HIGH (ref 70–99)
Glucose-Capillary: 173 mg/dL — ABNORMAL HIGH (ref 70–99)
Glucose-Capillary: 233 mg/dL — ABNORMAL HIGH (ref 70–99)
Glucose-Capillary: 272 mg/dL — ABNORMAL HIGH (ref 70–99)

## 2022-10-21 LAB — URINE CULTURE: Culture: 50000 — AB

## 2022-10-21 MED ORDER — PREDNISONE 20 MG PO TABS
30.0000 mg | ORAL_TABLET | Freq: Every day | ORAL | Status: AC
  Administered 2022-10-22: 30 mg via ORAL
  Filled 2022-10-21: qty 1

## 2022-10-21 MED ORDER — SODIUM CHLORIDE 0.9 % IV SOLN: INTRAVENOUS | Status: DC

## 2022-10-21 NOTE — Consult Note (Signed)
Consultation Note Date: 10/21/2022   Patient Name: Alicia Garner  DOB: [birth year]  MRN: [id number]  Age / Sex: 82 y.o., female  PCP: [facility] Referring Physician: Gwynne Edinger, MD  Reason for Consultation: Establishing goals of care  HPI/Patient Profile: 82 y.o. female  with past medical history of recent admission due to stroke with left-sided weakness, hypertension, hyperlipidemia, diabetes mellitus, CKD stage IV, and anemia admitted on 10/18/2022 with generalized weakness and foot pain. Patient was recently hospitalized due to stroke from 1/22 to 1/24. Patient came back to the emergency room on 1/25 after being released on 1/24 where she was complained of decreased p.o. intake.  Patient was given 1 L of fluids and had improvement of her blood pressure and so she was discharged. Patient came back this admission with decreased intake again and weakness. Found to have acute renal failure. Also with concern of gout flare in feet. PMT consulted to discuss [place].   Clinical Assessment and Goals of Care: I have reviewed medical records including EPIC notes, labs and imaging, received report from Dr. Si Raider, assessed the patient and then met with patient and her daughter Alicia Garner  to discuss diagnosis prognosis, GOC, EOL wishes, disposition and options.  I introduced Palliative Medicine as specialized medical care for people living with serious illness. It focuses on providing relief from the symptoms and stress of a serious illness. The goal is to improve quality of life for both the patient and the family.  Alicia Garner shares she is the person that helps the patient make medical decisions.  Alicia Garner shares the patient lives with her and her family.  She tells me prior to patient's stroke in January patient was ambulatory at home.  She occasionally used a walker.  She tells me she had a good appetite.  Alicia Garner tells me the patient was diagnosed with  dementia several years ago.  She tells me patient significantly struggles with short-term memory and sometimes confuses to make a with patient's sister.  Alicia Garner shares since the patient's stroke she has mostly been bedbound and not ambulatory.  She tells me she has continued with poor p.o. intake.   We discussed patient's current illness and what it means in the larger context of patient's on-going co-morbidities.  Natural disease trajectory and expectations at EOL were discussed.  I attempted to elicit values and goals of care important to the patient.    The difference between aggressive medical intervention and comfort care was considered in light of the patient's goals of care.   Advance directives, concepts specific to code status, artificial feeding and hydration, and rehospitalization were considered and discussed.   Encouraged daughter to consider DNR/DNI status understanding evidenced based poor outcomes in similar hospitalized patients, as the cause of the arrest is likely associated with chronic/terminal disease rather than a reversible acute cardio-pulmonary event.  Alicia Garner agrees patient would not want CPR or ventilator support.  I completed a MOST form today. The patient and family outlined their wishes for the following treatment decisions:  Cardiopulmonary Resuscitation: Do Not Attempt Resuscitation (DNR/No CPR)  Medical Interventions: Limited Additional Interventions: Use medical treatment, IV fluids and cardiac monitoring as indicated, DO NOT USE intubation or mechanical ventilation. May consider use of less invasive airway support such as BiPAP or CPAP. Also provide comfort measures. Transfer to the hospital if indicated. Avoid intensive care.   Antibiotics: Antibiotics if indicated  IV Fluids: IV fluids if indicated  Feeding Tube: No feeding tube   We also discussed  the possibility of hemodialysis and to make a sure she would be open to this if it were recommended.  We  discussed recommendation of rehab and Alicia Garner is agreeable to this as well.  Discussed with Alicia Garner the importance of continued conversation with family and the medical providers regarding overall plan of care and treatment options, ensuring decisions are within the context of the patient's values and GOCs.    Hospice and Palliative Care services outpatient were explained and offered.  Questions and concerns were addressed. The family was encouraged to call with questions or concerns.    Primary Decision Maker NEXT OF KIN -daughter Alicia Garner    SUMMARY OF RECOMMENDATIONS   MOST completed as above CODE STATUS changed to DNR/DNI Daughter interested in rehab following hospitalization Would be open to hemodialysis if recommended  Code Status/Advance Care Planning: DNR   Discharge Planning: [facility] for rehab with Palliative care service follow-up      Primary Diagnoses: Present on Admission:  Acute renal failure superimposed on stage 4 chronic kidney disease ([place])  CVA (cerebral vascular accident) ([place])  Essential hypertension  HLD (hyperlipidemia)  Chronic diastolic CHF (congestive heart failure) ([place])  Gout  Type II diabetes mellitus with renal manifestations ([place])  Normocytic anemia  Bilateral foot pain   I have reviewed the medical record, interviewed the patient and family, and examined the patient. The following aspects are pertinent.  Past Medical History:  Diagnosis Date   Diabetes mellitus without complication ([place])    Hyperlipidemia    Hypertension    Stroke [facility]    Social History   Socioeconomic History   Marital status: Widowed    Spouse name: Not on file   Number of children: Not on file   Years of education: Not on file   Highest education level: Not on file  Occupational History   Not on file  Tobacco Use   Smoking status: Never   Smokeless tobacco: Never  Vaping Use   Vaping Use: Never used  Substance and Sexual Activity    Alcohol use: Never   Drug use: Never   Sexual activity: Not Currently  Other Topics Concern   Not on file  Social History Narrative   Not on file   Social Determinants of Health   Financial Resource Strain: Not on file  Food Insecurity: No Food Insecurity (10/18/2022)   Hunger Vital Sign    Worried About Running Out of Food in the Last Year: Never true    Ran Out of Food in the Last Year: Never true  Transportation Needs: No Transportation Needs (10/18/2022)   PRAPARE - Hydrologist (Medical): No    Lack of Transportation (Non-Medical): No  Physical Activity: Not on file  Stress: Not on file  Social Connections: Not on file   Family History  Problem Relation Age of Onset   Hypertension Mother    Diabetes Mother    Diabetes Father    Hypertension Father    Scheduled Meds:  amLODipine  10 mg Oral Daily   aspirin EC  81 mg Oral Daily   clopidogrel  75 mg Oral Daily   cyanocobalamin  1,000 mcg Oral Daily   feeding supplement  237 mL Oral TID BM   heparin  5,000 Units Subcutaneous Q8H   insulin aspart  0-5 Units Subcutaneous QHS   insulin aspart  0-9 Units Subcutaneous TID WC   multivitamin with minerals  1 tablet Oral Daily   predniSONE  40 mg Oral Q breakfast   rosuvastatin  40 mg Oral QPM   sodium bicarbonate  650 mg Oral TID   Continuous Infusions:  sodium chloride 75 mL/hr at 10/21/22 0900   PRN Meds:.acetaminophen, hydrALAZINE, ondansetron (ZOFRAN) IV, oxyCODONE-acetaminophen, polyethylene glycol Allergies  Allergen Reactions   Lisinopril Swelling   Review of Systems  Unable to perform ROS: Dementia    Physical Exam Constitutional:      General: She is not in acute distress.    Appearance: She is ill-appearing.  Pulmonary:     [place]: Pulmonary effort is normal.  Skin:    General: Skin is warm and dry.  Neurological:     Mental Status: She is alert. She is disoriented.     Vital Signs: BP 133/71 (BP Location: Right Arm)    Pulse 83   Temp 98.8 F (37.1 C) (Oral)   Resp 16   Ht 5' 4"$  (1.626 m)   Wt 72.6 kg   LMP  (LMP Unknown)   SpO2 98%   BMI 27.47 kg/m  Pain Scale: 0-10   Pain Score: 0-No pain   SpO2: SpO2: 98 % O2 Device:SpO2: 98 % O2 Flow Rate: .   IO: Intake/output summary:  Intake/Output Summary (Last 24 hours) at 10/21/2022 1045 Last data filed at 10/21/2022 0606 Gross per 24 hour  Intake 326.35 ml  Output 1100 ml  Net -773.65 ml    LBM: Last BM Date : 10/17/22 Baseline Weight: Weight: 72.6 kg Most recent weight: Weight:  (72.6kg)     Palliative Assessment/Data: PPS 40%     *Please note that this is a verbal dictation therefore any spelling or grammatical errors are due to the "[employer] One" system interpretation.    Juel Burrow, DNP, AGNP-C Palliative Medicine Team [phone] Pager: [phone]

## 2022-10-21 NOTE — Progress Notes (Signed)
Progress Note   Patient: Alicia Garner WUJ:[id number] DOB: 08-29-1941 DOA: 10/18/2022     2 DOS: the patient was seen and examined on 10/21/2022   Brief hospital course: Taken from H&P.    Alicia Garner is a 82 y.o. female with medical history significant of recent admission due to stroke with left-sided weakness, hypertension, hyperlipidemia, diabetes mellitus, CKD stage IV, anemia, who presents with generalized weakness, bilateral foot pain.   Patient was recently hospitalized due to stroke from 1/22 to 1/24.  Patient has left sided weakness and difficulty walking after stroke. Patient came back to the emergency room on 1/25 after being released on 1/24 where she was complained of decreased p.o. intake.  Patient was given 1 L of fluids and had improvement of her blood pressure and so she was discharged.   Today, pt comes back due to generalized weakness in the past 3 days, poor appetite and decreased oral intake.    Data reviewed independently and ED Course: pt was found to have WBC 8.3, lactic acid 12, negative PCR for COVID and flu.  Worsening renal function with creatinine 3.29, BUN 43, GFR 14 (recent baseline creatinine 2.43)., Oxygen saturation 96% on room air, temperature 99.6, blood pressure 115/70, heart rate 92, RR 15.  CT of head is negative for acute abnormality, but showed previous infarction . EKG: Sinus rhythm, QTc 466, T wave inversion only in lead III, LAD, poor R wave progression, LVH.    1/30: Vital stable.  Renal function continued to get worse despite getting some IV fluid.  Non anion gap metabolic acidosis with bicarb of 18 secondary to AKI and CKD.  Uric acid mildly elevated at 7.2.  BNP normal at 37.6.  Urinary output of 800 recorded.  Nephrology was consulted. PT is recommending SNF.   Patient with poor baseline functional status-high risk for deterioration and mortality. Palliative care care is also consulted   Assessment and Plan: * Acute renal failure superimposed on  stage 4 chronic kidney disease ([name]) Metabolic acidosis Most likely prerenal due to dehydration and continue taking Cozaar.  Patient was also using Voltaren gel very regularly.  Renal function appears to have reached nadir today gfr 11 same as yesterday. Bicarb normalized. Renal u/s no signs obstruction. Cozaar all and Voltaren gel was held -Nephrology consulted - stop bicarb infusion, switch to NS, defer sodium bicarb tabs to nephro -Monitor renal function -Avoid nephrotoxins  Gout Concern of gout flare in feet.  Mildly elevated uric acid.  Pain improved with steroid. Home colchicine was held due to worsening renal function.  Procalcitonin 0.16. Symptoms resolved - continue prednisone (steroids started 1/29), will start weaning, 30 mg tomorrow  CVA (cerebral vascular accident) ([place]) Recent diagnosis -Continue aspirin, Plavix and Crestor  Generalized weakness PT is recommending SNF, has a bed and insurance auth  Essential hypertension Blood pressure within goal. Holding home Cozaar due to worsening renal function. -As needed hydralazine  Chronic diastolic CHF (congestive heart failure) ([place]) No concern of exacerbation.  Clinically appears euvolemic and BNP normal at 37. 2D echo 10/12/2022 showed EF of 55-60 with grade 1 diastolic dysfunction.  -Monitor volume status closely  Type II diabetes mellitus with renal manifestations ([name]) Seems well-controlled with A1c of 6.1.  Patient was on Januvia at home -SSI  HLD (hyperlipidemia) -Continue Crestor  Normocytic anemia Hemoglobin decreased to 8.2 this morning, no obvious bleeding.  Worsening renal function likely the culprit.  All cell lines decreased. -Monitor hemoglobin -Transfuse if below 7   Subjective:  Patient was seen and examined today.  Denies any pain.  Oriented to self only.  Denies pain   Physical Exam: Vitals:   10/20/22 0803 10/20/22 1558 10/20/22 2240 10/21/22 0747  BP: 123/63 128/70 129/84 133/71  Pulse: 78  85 87 83  Resp: 20 18 18 16   Temp: 98.4 F (36.9 C) 98 F (36.7 C) 98.8 F (37.1 C) 98.8 F (37.1 C)  TempSrc:  Oral  Oral  SpO2: 98% 100% 98% 98%  Weight:      Height:       General.  Chronically ill-appearing elderly lady, in no acute distress. Pulmonary.  Lungs clear bilaterally, normal respiratory effort. CV.  Regular rate and rhythm, no JVD, rub or murmur. Abdomen.  Soft, nontender, nondistended, BS positive. CNS.  Alert and oriented to self only.  No focal neurologic deficit. Extremities.  Trace LE edema, no cyanosis, pulses intact and symmetrical. Psychiatry.  Judgment and insight appears impaired.  Data Reviewed: Prior data reviewed  Family Communication: Discussed with daughter Alicia Garner @ bedside 2/1  Disposition: Status is: Inpatient Remains inpatient appropriate because: Severity of illness  Planned Discharge Destination: Skilled nursing facility  DVT prophylaxis.  Subcu heparin Time spent: 30  Author: Desma Maxim, MD 10/21/2022 10:51 AM  For on call review [email or web address].

## 2022-10-21 NOTE — Progress Notes (Signed)
[facility]  ROUNDING NOTE   Subjective:  Patient with past medical history of diabetes mellitus type 2, hypertension, per lipidemia, chronic kidney disease stage IV, CVA with left-sided weakness, anemia of chronic kidney disease who came in with generalized weakness and bilateral foot pain.  She was hospitalized in late January for CVA with left-sided weakness with difficulties with walking.  Patient seen resting in bed, ill-appearing Small amount of breakfast consumed RN at bedside restarting IV fluids. Remains on room air Creatinine unchanged.  Objective:  Vital signs in last 24 hours:  Temp:  [98 F (36.7 C)-98.8 F (37.1 C)] 98.8 F (37.1 C) (02/01 0747) Pulse Rate:  [83-87] 83 (02/01 0747) Resp:  [16-18] 16 (02/01 0747) BP: (128-133)/(70-84) 133/71 (02/01 0747) SpO2:  [98 %-100 %] 98 % (02/01 0747)  Weight change:  Filed Weights   10/18/22 1359  Weight: 72.6 kg    Intake/Output: I/O last 3 completed shifts: In: 326.4 [P.O.:240; I.V.:86.4] Out: 1800 [Urine:1800]   Intake/Output this shift:  No intake/output data recorded.  Physical Exam: General: No acute distress  Head: Normocephalic, atraumatic. Moist oral mucosal membranes  Lungs:  Clear to auscultation, normal effort  Heart: S1S2 no rubs  Abdomen:  Soft, nontender, bowel sounds present  Extremities: Trace peripheral edema.  Neurologic: Awake, alert, following commands  Skin: No acute rash  Access: No hemodialysis access    Basic Metabolic Panel: Recent Labs  Lab 10/18/22 0954 10/19/22 0228 10/20/22 0353 10/21/22 0604  NA 138 133* 136 139  K 4.0 3.9 4.4 4.0  CL 106 108 116* 110  CO2 18* 18* 16* 23  GLUCOSE 128* 158* 127* 190*  BUN 43* 48* 64* 78*  CREATININE 3.28* 3.39* 3.98* 3.99*  CALCIUM 8.7* 7.8* 7.5* 7.5*     Liver Function Tests: Recent Labs  Lab 10/18/22 0954  AST 28  ALT 23  ALKPHOS 69  BILITOT 0.9  PROT 8.0  ALBUMIN 3.3*    No results for input(s): "LIPASE",  "AMYLASE" in the last 168 hours. No results for input(s): "AMMONIA" in the last 168 hours.  CBC: Recent Labs  Lab 10/18/22 0954 10/19/22 0228 10/20/22 0353 10/21/22 0604  WBC 8.3 7.1 8.6 8.2  NEUTROABS 5.6  --   --   --   HGB 10.9* 9.3* 8.7* 8.1*  HCT 33.9* 28.4* 26.5* 24.0*  MCV 93.4 90.4 90.1 89.6  PLT 171 148* 151 142*     Cardiac Enzymes: No results for input(s): "CKTOTAL", "CKMB", "CKMBINDEX", "TROPONINI" in the last 168 hours.  BNP: Invalid input(s): "POCBNP"  CBG: Recent Labs  Lab 10/20/22 1232 10/20/22 1558 10/20/22 2232 10/21/22 0815 10/21/22 1125  GLUCAP 226* 210* 168* 131* 173*     Microbiology: Results for orders placed or performed during the hospital encounter of 10/18/22  Resp panel by RT-PCR (RSV, Flu A&B, Covid) Anterior Nasal Swab     Status: None   Collection Time: 10/18/22  9:59 AM   Specimen: Anterior Nasal Swab  Result Value Ref Range Status   SARS Coronavirus 2 by RT PCR NEGATIVE NEGATIVE Final    Comment: (NOTE) SARS-CoV-2 target nucleic acids are NOT DETECTED.  The SARS-CoV-2 RNA is generally detectable in upper respiratory specimens during the acute phase of infection. The lowest concentration of SARS-CoV-2 viral copies this assay can detect is 138 copies/mL. A negative result does not preclude SARS-Cov-2 infection and should not be used as the sole basis for treatment or other patient management decisions. A negative result may occur with  improper specimen collection/handling, submission of specimen other than nasopharyngeal swab, presence of viral mutation(s) within the areas targeted by this assay, and inadequate number of viral copies(<138 copies/mL). A negative result must be combined with clinical observations, patient history, and epidemiological information. The expected result is Negative.  Fact Sheet for Patients:  EntrepreneurPulse.com.au  Fact Sheet for Healthcare Providers:   IncredibleEmployment.be  This test is no t yet approved or cleared by the [place] FDA and  has been authorized for detection and/or diagnosis of SARS-CoV-2 by FDA under an Emergency Use Authorization (EUA). This EUA will remain  in effect (meaning this test can be used) for the duration of the COVID-19 declaration under Section 564(b)(1) of the Act, 21 U.S.C.section 360bbb-3(b)(1), unless the authorization is terminated  or revoked sooner.       Influenza A by PCR NEGATIVE NEGATIVE Final   Influenza B by PCR NEGATIVE NEGATIVE Final    Comment: (NOTE) The Xpert Xpress SARS-CoV-2/FLU/RSV plus assay is intended as an aid in the diagnosis of influenza from Nasopharyngeal swab specimens and should not be used as a sole basis for treatment. Nasal washings and aspirates are unacceptable for Xpert Xpress SARS-CoV-2/FLU/RSV testing.  Fact Sheet for Patients: EntrepreneurPulse.com.au  Fact Sheet for Healthcare Providers: IncredibleEmployment.be  This test is not yet approved or cleared by the [place] FDA and has been authorized for detection and/or diagnosis of SARS-CoV-2 by FDA under an Emergency Use Authorization (EUA). This EUA will remain in effect (meaning this test can be used) for the duration of the COVID-19 declaration under Section 564(b)(1) of the Act, 21 U.S.C. section 360bbb-3(b)(1), unless the authorization is terminated or revoked.     Resp Syncytial Virus by PCR NEGATIVE NEGATIVE Final    Comment: (NOTE) Fact Sheet for Patients: EntrepreneurPulse.com.au  Fact Sheet for Healthcare Providers: IncredibleEmployment.be  This test is not yet approved or cleared by the [place] FDA and has been authorized for detection and/or diagnosis of SARS-CoV-2 by FDA under an Emergency Use Authorization (EUA). This EUA will remain in effect (meaning this test can be used) for  the duration of the COVID-19 declaration under Section 564(b)(1) of the Act, 21 U.S.C. section 360bbb-3(b)(1), unless the authorization is terminated or revoked.  Performed at [facility], [address] [place], [place] [address]   Urine Culture     Status: Abnormal   Collection Time: 10/18/22 11:06 AM   Specimen: Urine, Clean Catch  Result Value Ref Range Status   Specimen Description   Final    URINE, CLEAN CATCH Performed at [facility], [place] [address] [place], [place] [address]    Special Requests   Final    NONE Reflexed from [id number] Performed at [facility], [place], [place] [address]    Culture 50,000 COLONIES/mL PROTEUS MIRABILIS (A)  Final   Report Status 10/21/2022 FINAL  Final   Organism ID, Bacteria PROTEUS MIRABILIS (A)  Final      Susceptibility   Proteus mirabilis - MIC*    AMPICILLIN <=2 SENSITIVE Sensitive     CEFAZOLIN 8 SENSITIVE Sensitive     CEFEPIME <=0.12 SENSITIVE Sensitive     CEFTRIAXONE <=0.25 SENSITIVE Sensitive     CIPROFLOXACIN <=0.25 SENSITIVE Sensitive     GENTAMICIN <=1 SENSITIVE Sensitive     IMIPENEM 2 SENSITIVE Sensitive     NITROFURANTOIN 128 RESISTANT Resistant     TRIMETH/SULFA <=20 SENSITIVE Sensitive     AMPICILLIN/SULBACTAM <=2 SENSITIVE Sensitive  PIP/TAZO <=4 SENSITIVE Sensitive     * 50,000 COLONIES/mL PROTEUS MIRABILIS    Coagulation Studies: No results for input(s): "LABPROT", "INR" in the last 72 hours.  Urinalysis: No results for input(s): "COLORURINE", "LABSPEC", "PHURINE", "GLUCOSEU", "HGBUR", "BILIRUBINUR", "KETONESUR", "PROTEINUR", "UROBILINOGEN", "NITRITE", "LEUKOCYTESUR" in the last 72 hours.  Invalid input(s): "APPERANCEUR"     Imaging: US RENAL  Result Date: 10/20/2022 CLINICAL DATA:  Acute kidney injury EXAM: RENAL / URINARY TRACT ULTRASOUND COMPLETE COMPARISON:  None Available. FINDINGS: Right Kidney: Renal measurements: 10.6 x 5.1 x 3.7 cm = volume: 105 mL.  Echogenicity within normal limits. No mass or hydronephrosis visualized. Simple appearing cyst of the upper pole of the right kidney measuring up to 1.9 cm. Left Kidney: Renal measurements: 9.0 x 4.7 x 3.8 cm = volume: 84.3 mL. Echogenicity within normal limits. No mass or hydronephrosis visualized. Bladder: Appears normal for degree of bladder distention. Other: None. IMPRESSION: No hydronephrosis. Electronically Signed   By: Yetta Glassman M.D.   On: 10/20/2022 14:45     Medications:    sodium chloride 75 mL/hr at 10/21/22 0900    amLODipine  10 mg Oral Daily   aspirin EC  81 mg Oral Daily   clopidogrel  75 mg Oral Daily   cyanocobalamin  1,000 mcg Oral Daily   feeding supplement  237 mL Oral TID BM   heparin  5,000 Units Subcutaneous Q8H   insulin aspart  0-5 Units Subcutaneous QHS   insulin aspart  0-9 Units Subcutaneous TID WC   multivitamin with minerals  1 tablet Oral Daily   [START ON 10/22/2022] predniSONE  30 mg Oral Q breakfast   rosuvastatin  40 mg Oral QPM   sodium bicarbonate  650 mg Oral TID   acetaminophen, hydrALAZINE, ondansetron (ZOFRAN) IV, oxyCODONE-acetaminophen, polyethylene glycol  Assessment/ Plan:  82 y.o. female with past medical history of diabetes mellitus type 2, hypertension, lipidemia, chronic kidney disease stage IV, CVA with left-sided weakness, anemia chronic kidney disease who presented with generalized weakness and bilateral foot pain.  1.  Acute kidney injury/chronic kidney disease stage IV baseline creatinine 1.98 with EGFR of 25.  Chronic kidney disease likely secondary to diabetic nephropathy.  Acute kidney injury now may be due to acute tubular necrosis from relative hypotension.    Creatinine relatively unchanged from yesterday.  Continue IV fluids for now.  Patient encouraged to increase oral intake.  No acute need for dialysis at this time.  Continue to avoid nephrotoxic agents and therapies if possible.  2.  Acute metabolic acidosis.  Serum  bicarbonate corrected to 23 today.  IV fluids changed to normal saline.  3.  Anemia of chronic kidney disease.  Hemoglobin continues to decrease, 8.1.  May consider ESA's in near future.   LOS: 2   2/1/20241:21 PM

## 2022-10-21 NOTE — Progress Notes (Signed)
Physical Therapy Treatment Patient Details Name: Alicia Garner MRN: [id number] DOB: Jan 08, 1941 Today's Date: 10/21/2022   History of Present Illness presented to ER secondary to generalized weakness, bilat foot pain; admitted for management of acute renal failure with stage 4 CKD.  Of note, recently hospitalized (1/22-24) due to acute CVA (R BG/internal capsule) infarct    PT Comments    Pt resting in bed upon PT arrival; agreeable to therapy and requesting to get to the chair.  During session pt mod assist semi-supine to sitting edge of bed and max assist stand pivot transfer bed to recliner (vc's and extra time required for positioning and technique); L lean noted more in standing than sitting position.  Pt unable to take any steps in standing.  Will continue to focus on strengthening and progressive functional mobility during hospitalization.    Recommendations for follow up therapy are one component of a multi-disciplinary discharge planning process, led by the attending physician.  Recommendations may be updated based on patient status, additional functional criteria and insurance authorization.  Follow Up Recommendations  Skilled nursing-short term rehab (<3 hours/day) Can patient physically be transported by private vehicle: No   Assistance Recommended at Discharge Frequent or constant Supervision/Assistance  Patient can return home with the following Two people to help with walking and/or transfers;A lot of help with bathing/dressing/bathroom;Assist for transportation;Help with stairs or ramp for entrance;Direct supervision/assist for financial management;Direct supervision/assist for medications management   Equipment Recommendations   (If chooses to discharge home, recommend hospital bed and hoyer lift)    Recommendations for Other Services       Precautions / Restrictions Precautions Precautions: Fall Restrictions Weight Bearing Restrictions: No     Mobility  Bed  Mobility Overal bed mobility: Needs Assistance Bed Mobility: Supine to Sit     Supine to sit: Mod assist     General bed mobility comments: assist for trunk and B LE's; vc's for technique    Transfers Overall transfer level: Needs assistance   Transfers: Sit to/from Stand, Bed to chair/wheelchair/BSC Sit to Stand: Max assist Stand pivot transfers: Max assist         General transfer comment: mild L lateral lean noted during standing/transfers; max assist to stand from bed with single UE support x2 trials; max assist stand pivot transfer bed to recliner; vc's for technique    Ambulation/Gait               General Gait Details: pt unable to take any steps in place   Stairs             Wheelchair Mobility    Modified Rankin (Stroke Patients Only)       Balance Overall balance assessment: Needs assistance Sitting-balance support: No upper extremity supported, Feet supported Sitting balance-Leahy Scale: Fair Sitting balance - Comments: steady static sitting; occasional vc's to correct posture d/t posterior lean tendancy (minimal L lean noted)   Standing balance support: Single extremity supported Standing balance-Leahy Scale: Poor Standing balance comment: assist for balance and upright d/t L lean                            Cognition Arousal/Alertness: Awake/alert Behavior During Therapy: Flat affect Overall Cognitive Status: History of cognitive impairments - at baseline                                 General  Comments: Oriented to self and hospital.  Increased time to follow 1 step cues.        Exercises General Exercises - Lower Extremity Ankle Circles/Pumps: AROM, Strengthening, Both, 10 reps, Seated Long Arc Quad: AROM, Strengthening, Both, 10 reps, Seated Hip Flexion/Marching: AROM, Strengthening, Both, 10 reps, Seated    General Comments  Nursing cleared pt for participation in physical therapy.  Pt agreeable to  PT session.      Pertinent Vitals/Pain Pain Assessment Pain Assessment: Faces Faces Pain Scale: Hurts a little bit Pain Location: bilat feet/ankles Pain Descriptors / Indicators: Guarding Pain Intervention(s): Limited activity within patient's tolerance, Monitored during session, Repositioned Vitals (HR and O2 on room air) stable and WFL throughout treatment session.    Home Living                          Prior Function            PT Goals (current goals can now be found in the care plan section) Acute Rehab PT Goals Patient Stated Goal: to return home PT Goal Formulation: With patient/family Time For Goal Achievement: 11/02/22 Potential to Achieve Goals: Fair Progress towards PT goals: Progressing toward goals    Frequency    Min 2X/week      PT Plan Current plan remains appropriate    Co-evaluation              AM-PAC PT "6 Clicks" Mobility   Outcome Measure  Help needed turning from your back to your side while in a flat bed without using bedrails?: A Lot Help needed moving from lying on your back to sitting on the side of a flat bed without using bedrails?: A Lot Help needed moving to and from a bed to a chair (including a wheelchair)?: A Lot Help needed standing up from a chair using your arms (e.g., wheelchair or bedside chair)?: A Lot Help needed to walk in hospital room?: Total Help needed climbing 3-5 steps with a railing? : Total 6 Click Score: 10    End of Session Equipment Utilized During Treatment: Gait belt Activity Tolerance: Patient tolerated treatment well Patient left: in chair;with call bell/phone within reach;with chair alarm set Nurse Communication: Mobility status;Precautions PT Visit Diagnosis: Difficulty in walking, not elsewhere classified (R26.2);Other abnormalities of gait and mobility (R26.89);Muscle weakness (generalized) (M62.81)     Time: 9983-3825 PT Time Calculation (min) (ACUTE ONLY): 19 min  Charges:   $Therapeutic Activity: 8-22 mins                     Leitha Bleak, PT 10/21/22, 2:53 PM

## 2022-10-22 DIAGNOSIS — N184 Chronic kidney disease, stage 4 (severe): Secondary | ICD-10-CM | POA: Diagnosis not present

## 2022-10-22 DIAGNOSIS — N179 Acute kidney failure, unspecified: Secondary | ICD-10-CM | POA: Diagnosis not present

## 2022-10-22 LAB — BASIC METABOLIC PANEL
Anion gap: 8 (ref 5–15)
BUN: 76 mg/dL — ABNORMAL HIGH (ref 8–23)
CO2: 21 mmol/L — ABNORMAL LOW (ref 22–32)
Calcium: 7.9 mg/dL — ABNORMAL LOW (ref 8.9–10.3)
Chloride: 112 mmol/L — ABNORMAL HIGH (ref 98–111)
Creatinine, Ser: 3.83 mg/dL — ABNORMAL HIGH (ref 0.44–1.00)
GFR, Estimated: 11 mL/min — ABNORMAL LOW (ref >60)
Glucose, Bld: 130 mg/dL — ABNORMAL HIGH (ref 70–99)
Potassium: 3.9 mmol/L (ref 3.5–5.1)
Sodium: 141 mmol/L (ref 135–145)

## 2022-10-22 LAB — CBC
HCT: 23.9 % — ABNORMAL LOW (ref 36.0–46.0)
Hemoglobin: 7.9 g/dL — ABNORMAL LOW (ref 12.0–15.0)
MCH: 29.8 pg (ref 26.0–34.0)
MCHC: 33.1 g/dL (ref 30.0–36.0)
MCV: 90.2 fL (ref 80.0–100.0)
Platelets: 141 10*3/uL — ABNORMAL LOW (ref 150–400)
RBC: 2.65 MIL/uL — ABNORMAL LOW (ref 3.87–5.11)
RDW: 14.2 % (ref 11.5–15.5)
WBC: 8.7 10*3/uL (ref 4.0–10.5)
nRBC: 0 % (ref 0.0–0.2)

## 2022-10-22 LAB — GLUCOSE, CAPILLARY
Glucose-Capillary: 130 mg/dL — ABNORMAL HIGH (ref 70–99)
Glucose-Capillary: 202 mg/dL — ABNORMAL HIGH (ref 70–99)
Glucose-Capillary: 53 mg/dL — ABNORMAL LOW (ref 70–99)
Glucose-Capillary: 266 mg/dL — ABNORMAL HIGH (ref 70–99)
Glucose-Capillary: 298 mg/dL — ABNORMAL HIGH (ref 70–99)
Glucose-Capillary: 262 mg/dL — ABNORMAL HIGH (ref 70–99)

## 2022-10-22 MED ORDER — PREDNISONE 20 MG PO TABS
20.0000 mg | ORAL_TABLET | Freq: Every day | ORAL | Status: AC
  Administered 2022-10-23: 20 mg via ORAL
  Filled 2022-10-22: qty 1

## 2022-10-22 NOTE — TOC Progression Note (Signed)
Transition of Care [facility]) - Progression Note    Patient Details  Name: Alicia Garner MRN: [id number] Date of Birth: 1941/09/07  Transition of Care [facility]) CM/SW [place], RN Phone Number: 10/22/2022, 11:17 AM  Clinical Narrative:   Due to Kidney function per conversation with Physician, the patient is not ready to DC to [name] today, I notified [facility]    Expected Discharge Plan: Skilled Nursing Facility Barriers to Discharge: Insurance Authorization  Expected Discharge Plan and Services                                               Social Determinants of Health (SDOH) Interventions SDOH Screenings   Food Insecurity: No Food Insecurity (10/18/2022)  Housing: Low Risk  (10/18/2022)  Transportation Needs: No Transportation Needs (10/18/2022)  Utilities: Not At Risk (10/18/2022)  Tobacco Use: Low Risk  (10/18/2022)    Readmission Risk Interventions    12/05/2021    3:14 PM  Readmission Risk Prevention Plan  Transportation Screening Complete  PCP or Specialist Appt within 5-7 Days Complete  Home Care Screening Complete  Medication Review (RN CM) Complete

## 2022-10-22 NOTE — Care Management Important Message (Signed)
Important Message  Patient Details  Name: Lorriane Dehart MRN: [id number] Date of Birth: [birth year]   Medicare Important Message Given:  Yes     Dannette Barbara 10/22/2022, 10:35 AM

## 2022-10-22 NOTE — Progress Notes (Signed)
[facility]  ROUNDING NOTE   Subjective:  Patient with past medical history of diabetes mellitus type 2, hypertension, per lipidemia, chronic kidney disease stage IV, CVA with left-sided weakness, anemia of chronic kidney disease who came in with generalized weakness and bilateral foot pain.  She was hospitalized in late January for CVA with left-sided weakness with difficulties with walking.  Patient seen sitting up in bed No family at bedside Untouched breakfast tray at bedside  Creatinine stable UOP 1.1L in past 24 hours  Objective:  Vital signs in last 24 hours:  Temp:  [98.2 F (36.8 C)-98.6 F (37 C)] 98.6 F (37 C) (02/02 0737) Pulse Rate:  [79-89] 79 (02/02 0737) Resp:  [17-18] 17 (02/02 0737) BP: (141-153)/(79-97) 151/79 (02/02 0737) SpO2:  [100 %] 100 % (02/02 0737)  Weight change:  Filed Weights   10/18/22 1359  Weight: 72.6 kg    Intake/Output: I/O last 3 completed shifts: In: 136.1 [I.V.:136.1] Out: 1600 [Urine:1600]   Intake/Output this shift:  Total I/O In: -  Out: 150 [Urine:150]  Physical Exam: General: No acute distress  Head: Normocephalic, atraumatic. Moist oral mucosal membranes  Lungs:  Clear to auscultation, normal effort  Heart: S1S2 no rubs  Abdomen:  Soft, nontender, bowel sounds present  Extremities: Trace peripheral edema.  Neurologic: Awake, alert, following commands  Skin: No acute rash  Access: No hemodialysis access    Basic Metabolic Panel: Recent Labs  Lab 10/18/22 0954 10/19/22 0228 10/20/22 0353 10/21/22 0604 10/22/22 0427  NA 138 133* 136 139 141  K 4.0 3.9 4.4 4.0 3.9  CL 106 108 116* 110 112*  CO2 18* 18* 16* 23 21*  GLUCOSE 128* 158* 127* 190* 130*  BUN 43* 48* 64* 78* 76*  CREATININE 3.28* 3.39* 3.98* 3.99* 3.83*  CALCIUM 8.7* 7.8* 7.5* 7.5* 7.9*     Liver Function Tests: Recent Labs  Lab 10/18/22 0954  AST 28  ALT 23  ALKPHOS 69  BILITOT 0.9  PROT 8.0  ALBUMIN 3.3*    No results  for input(s): "LIPASE", "AMYLASE" in the last 168 hours. No results for input(s): "AMMONIA" in the last 168 hours.  CBC: Recent Labs  Lab 10/18/22 0954 10/19/22 0228 10/20/22 0353 10/21/22 0604 10/22/22 0427  WBC 8.3 7.1 8.6 8.2 8.7  NEUTROABS 5.6  --   --   --   --   HGB 10.9* 9.3* 8.7* 8.1* 7.9*  HCT 33.9* 28.4* 26.5* 24.0* 23.9*  MCV 93.4 90.4 90.1 89.6 90.2  PLT 171 148* 151 142* 141*     Cardiac Enzymes: No results for input(s): "CKTOTAL", "CKMB", "CKMBINDEX", "TROPONINI" in the last 168 hours.  BNP: Invalid input(s): "POCBNP"  CBG: Recent Labs  Lab 10/21/22 1125 10/21/22 1700 10/21/22 2108 10/22/22 0737 10/22/22 1147  GLUCAP 173* 233* 272* 130* 47*     Microbiology: Results for orders placed or performed during the hospital encounter of 10/18/22  Resp panel by RT-PCR (RSV, Flu A&B, Covid) Anterior Nasal Swab     Status: None   Collection Time: 10/18/22  9:59 AM   Specimen: Anterior Nasal Swab  Result Value Ref Range Status   SARS Coronavirus 2 by RT PCR NEGATIVE NEGATIVE Final    Comment: (NOTE) SARS-CoV-2 target nucleic acids are NOT DETECTED.  The SARS-CoV-2 RNA is generally detectable in upper respiratory specimens during the acute phase of infection. The lowest concentration of SARS-CoV-2 viral copies this assay can detect is 138 copies/mL. A negative result does not preclude SARS-Cov-2  infection and should not be used as the sole basis for treatment or other patient management decisions. A negative result may occur with  improper specimen collection/handling, submission of specimen other than nasopharyngeal swab, presence of viral mutation(s) within the areas targeted by this assay, and inadequate number of viral copies(<138 copies/mL). A negative result must be combined with clinical observations, patient history, and epidemiological information. The expected result is Negative.  Fact Sheet for Patients:   EntrepreneurPulse.com.au  Fact Sheet for Healthcare Providers:  IncredibleEmployment.be  This test is no t yet approved or cleared by the [place] FDA and  has been authorized for detection and/or diagnosis of SARS-CoV-2 by FDA under an Emergency Use Authorization (EUA). This EUA will remain  in effect (meaning this test can be used) for the duration of the COVID-19 declaration under Section 564(b)(1) of the Act, 21 U.S.C.section 360bbb-3(b)(1), unless the authorization is terminated  or revoked sooner.       Influenza A by PCR NEGATIVE NEGATIVE Final   Influenza B by PCR NEGATIVE NEGATIVE Final    Comment: (NOTE) The Xpert Xpress SARS-CoV-2/FLU/RSV plus assay is intended as an aid in the diagnosis of influenza from Nasopharyngeal swab specimens and should not be used as a sole basis for treatment. Nasal washings and aspirates are unacceptable for Xpert Xpress SARS-CoV-2/FLU/RSV testing.  Fact Sheet for Patients: EntrepreneurPulse.com.au  Fact Sheet for Healthcare Providers: IncredibleEmployment.be  This test is not yet approved or cleared by the [place] FDA and has been authorized for detection and/or diagnosis of SARS-CoV-2 by FDA under an Emergency Use Authorization (EUA). This EUA will remain in effect (meaning this test can be used) for the duration of the COVID-19 declaration under Section 564(b)(1) of the Act, 21 U.S.C. section 360bbb-3(b)(1), unless the authorization is terminated or revoked.     Resp Syncytial Virus by PCR NEGATIVE NEGATIVE Final    Comment: (NOTE) Fact Sheet for Patients: EntrepreneurPulse.com.au  Fact Sheet for Healthcare Providers: IncredibleEmployment.be  This test is not yet approved or cleared by the [place] FDA and has been authorized for detection and/or diagnosis of SARS-CoV-2 by FDA under an Emergency Use  Authorization (EUA). This EUA will remain in effect (meaning this test can be used) for the duration of the COVID-19 declaration under Section 564(b)(1) of the Act, 21 U.S.C. section 360bbb-3(b)(1), unless the authorization is terminated or revoked.  Performed at [facility], [address] [place], [place] [address]   Urine Culture     Status: Abnormal   Collection Time: 10/18/22 11:06 AM   Specimen: Urine, Clean Catch  Result Value Ref Range Status   Specimen Description   Final    URINE, CLEAN CATCH Performed at [facility], [place] [address] [place], [place] [address]    Special Requests   Final    NONE Reflexed from [id number] Performed at [facility], [place], [place] [address]    Culture 50,000 COLONIES/mL PROTEUS MIRABILIS (A)  Final   Report Status 10/21/2022 FINAL  Final   Organism ID, Bacteria PROTEUS MIRABILIS (A)  Final      Susceptibility   Proteus mirabilis - MIC*    AMPICILLIN <=2 SENSITIVE Sensitive     CEFAZOLIN 8 SENSITIVE Sensitive     CEFEPIME <=0.12 SENSITIVE Sensitive     CEFTRIAXONE <=0.25 SENSITIVE Sensitive     CIPROFLOXACIN <=0.25 SENSITIVE Sensitive     GENTAMICIN <=1 SENSITIVE Sensitive     IMIPENEM 2 SENSITIVE Sensitive  NITROFURANTOIN 128 RESISTANT Resistant     TRIMETH/SULFA <=20 SENSITIVE Sensitive     AMPICILLIN/SULBACTAM <=2 SENSITIVE Sensitive     PIP/TAZO <=4 SENSITIVE Sensitive     * 50,000 COLONIES/mL PROTEUS MIRABILIS    Coagulation Studies: No results for input(s): "LABPROT", "INR" in the last 72 hours.  Urinalysis: No results for input(s): "COLORURINE", "LABSPEC", "PHURINE", "GLUCOSEU", "HGBUR", "BILIRUBINUR", "KETONESUR", "PROTEINUR", "UROBILINOGEN", "NITRITE", "LEUKOCYTESUR" in the last 72 hours.  Invalid input(s): "APPERANCEUR"     Imaging: US RENAL  Result Date: 10/20/2022 CLINICAL DATA:  Acute kidney injury EXAM: RENAL / URINARY TRACT ULTRASOUND COMPLETE COMPARISON:  None  Available. FINDINGS: Right Kidney: Renal measurements: 10.6 x 5.1 x 3.7 cm = volume: 105 mL. Echogenicity within normal limits. No mass or hydronephrosis visualized. Simple appearing cyst of the upper pole of the right kidney measuring up to 1.9 cm. Left Kidney: Renal measurements: 9.0 x 4.7 x 3.8 cm = volume: 84.3 mL. Echogenicity within normal limits. No mass or hydronephrosis visualized. Bladder: Appears normal for degree of bladder distention. Other: None. IMPRESSION: No hydronephrosis. Electronically Signed   By: Yetta Glassman M.D.   On: 10/20/2022 14:45     Medications:    sodium chloride 50 mL/hr at 10/22/22 1303    amLODipine  10 mg Oral Daily   aspirin EC  81 mg Oral Daily   clopidogrel  75 mg Oral Daily   cyanocobalamin  1,000 mcg Oral Daily   feeding supplement  237 mL Oral TID BM   heparin  5,000 Units Subcutaneous Q8H   insulin aspart  0-5 Units Subcutaneous QHS   insulin aspart  0-9 Units Subcutaneous TID WC   multivitamin with minerals  1 tablet Oral Daily   [START ON 10/23/2022] predniSONE  20 mg Oral Q breakfast   rosuvastatin  40 mg Oral QPM   sodium bicarbonate  650 mg Oral TID   acetaminophen, hydrALAZINE, ondansetron (ZOFRAN) IV, oxyCODONE-acetaminophen, polyethylene glycol  Assessment/ Plan:  82 y.o. female with past medical history of diabetes mellitus type 2, hypertension, lipidemia, chronic kidney disease stage IV, CVA with left-sided weakness, anemia chronic kidney disease who presented with generalized weakness and bilateral foot pain.  1.  Acute kidney injury/chronic kidney disease stage IV baseline creatinine 1.98 with EGFR of 25.  Chronic kidney disease likely secondary to diabetic nephropathy.  Acute kidney injury now may be due to acute tubular necrosis from relative hypotension.    Creatinine shows small improvement since admission. Urine output adequate. No acute need for dialysis at this time. Will continue IVF for an additional day, will reduce IVF rate  to 75ml/hr to avoid overload. Will followup with patient in office at discharge.   2.  Acute metabolic acidosis.  Serum bicarbonate corrected with IVF  3.  Anemia of chronic kidney disease.  Hemoglobin 7.9 today. Will address [facility] outpatient.    LOS: 3   2/2/20241:07 PM

## 2022-10-22 NOTE — Progress Notes (Signed)
Progress Note   Patient: Alicia Garner GQQ:[id number] DOB: [birth year] DOA: 10/18/2022     3 DOS: the patient was seen and examined on 10/22/2022   Brief hospital course: Taken from H&P.    Alicia Garner is a 82 y.o. female with medical history significant of recent admission due to stroke with left-sided weakness, hypertension, hyperlipidemia, diabetes mellitus, CKD stage IV, anemia, who presents with generalized weakness, bilateral foot pain.   Patient was recently hospitalized due to stroke from 1/22 to 1/24.  Patient has left sided weakness and difficulty walking after stroke. Patient came back to the emergency room on 1/25 after being released on 1/24 where she was complained of decreased p.o. intake.  Patient was given 1 L of fluids and had improvement of her blood pressure and so she was discharged.   Today, pt comes back due to generalized weakness in the past 3 days, poor appetite and decreased oral intake.    Data reviewed independently and ED Course: pt was found to have WBC 8.3, lactic acid 12, negative PCR for COVID and flu.  Worsening renal function with creatinine 3.29, BUN 43, GFR 14 (recent baseline creatinine 2.43)., Oxygen saturation 96% on room air, temperature 99.6, blood pressure 115/70, heart rate 92, RR 15.  CT of head is negative for acute abnormality, but showed previous infarction . EKG: Sinus rhythm, QTc 466, T wave inversion only in lead III, LAD, poor R wave progression, LVH.    1/30: Vital stable.  Renal function continued to get worse despite getting some IV fluid.  Non anion gap metabolic acidosis with bicarb of 18 secondary to AKI and CKD.  Uric acid mildly elevated at 7.2.  BNP normal at 37.6.  Urinary output of 800 recorded.  Nephrology was consulted. PT is recommending SNF.   Assessment and Plan: * Acute renal failure superimposed on stage 4 chronic kidney disease ([name]) Metabolic acidosis Most likely prerenal due to dehydration and continue taking Cozaar.  Patient  was also using Voltaren gel very regularly.  Renal function appears to have reached nadir today gfr 11 same as last two days. Bicarb improved. Renal u/s no signs obstruction. Uop 1100 yesterday Cozaar all and Voltaren gel was held -Nephrology following, considering initiation of hemodialysis if no improvement next day or two - cont sodium bicarb tablets -Monitor renal function -Avoid nephrotoxins  Gout Concern of gout flare in feet.  Mildly elevated uric acid.  Pain improved with steroid. Home colchicine was held due to worsening renal function.  Procalcitonin 0.16. Symptoms resolved - continue prednisone (steroids started 1/29), will continue weaning, 20 mg tomorrow  CVA (cere[facility] vascular accident) [facility]) Recent diagnosis -Continue aspirin, Plavix and Crestor  Generalized weakness PT is recommending SNF, has a bed and insurance auth  Essential hypertension Blood pressure mild elevation today Holding home Cozaar due to worsening renal function. -As needed hydralazine  Chronic diastolic CHF (congestive heart failure) ([name]) No concern of exacerbation.  Clinically appears euvolemic and BNP normal at 37. 2D echo 10/12/2022 showed EF of 55-60 with grade 1 diastolic dysfunction.  -Monitor volume status closely  Type II diabetes mellitus with renal manifestations ([name]) Seems well-controlled with A1c of 6.1.  Patient was on Januvia at home -SSI  HLD (hyperlipidemia) -Continue Crestor  Normocytic anemia Hemoglobin decreased to 7.9 this morning, no bleeding.  Worsening renal function likely the culprit.  All cell lines decreased. -Monitor hemoglobin - may need esa, defer to nephrology -Transfuse if below 7   Subjective: Patient was seen and  examined today.  Denies any pain.  Oriented to self only.  Denies pain tolerating diet.  Physical Exam: Vitals:   10/21/22 0747 10/21/22 1659 10/21/22 2335 10/22/22 0737  BP: 133/71 (!) 141/82 (!) 153/97 (!) 151/79  Pulse: 83 87 89 79  Resp:  16 18 18 17   Temp: 98.8 F (37.1 C) 98.2 F (36.8 C) 98.5 F (36.9 C) 98.6 F (37 C)  TempSrc: Oral     SpO2: 98% 100% 100% 100%  Weight:      Height:       General.  Chronically ill-appearing elderly lady, in no acute distress. Pulmonary.  Lungs clear bilaterally, normal respiratory effort. CV.  Regular rate and rhythm, no JVD, rub or murmur. Abdomen.  Soft, nontender, nondistended, BS positive. CNS.  Alert and oriented to self only.  No focal neurologic deficit. Extremities.  Trace LE edema, no cyanosis, pulses intact and symmetrical. Psychiatry.  Judgment and insight appears impaired.  Data Reviewed: Prior data reviewed  Family Communication: Discussed with daughter tameka @ bedside 2/2  Disposition: Status is: Inpatient Remains inpatient appropriate because: Severity of illness  Planned Discharge Destination: Skilled nursing facility  DVT prophylaxis.  Subcu heparin Time spent: 30  Author: Desma Maxim, MD 10/22/2022 12:29 PM  For on call review [email or web address].

## 2022-10-23 DIAGNOSIS — N184 Chronic kidney disease, stage 4 (severe): Secondary | ICD-10-CM | POA: Diagnosis not present

## 2022-10-23 DIAGNOSIS — N179 Acute kidney failure, unspecified: Secondary | ICD-10-CM | POA: Diagnosis not present

## 2022-10-23 LAB — CBC
HCT: 23.4 % — ABNORMAL LOW (ref 36.0–46.0)
Hemoglobin: 7.7 g/dL — ABNORMAL LOW (ref 12.0–15.0)
MCH: 29.5 pg (ref 26.0–34.0)
MCHC: 32.9 g/dL (ref 30.0–36.0)
MCV: 89.7 fL (ref 80.0–100.0)
Platelets: 140 10*3/uL — ABNORMAL LOW (ref 150–400)
RBC: 2.61 MIL/uL — ABNORMAL LOW (ref 3.87–5.11)
RDW: 14.3 % (ref 11.5–15.5)
WBC: 9.5 10*3/uL (ref 4.0–10.5)
nRBC: 0 % (ref 0.0–0.2)

## 2022-10-23 LAB — GLUCOSE, CAPILLARY
Glucose-Capillary: 232 mg/dL — ABNORMAL HIGH (ref 70–99)
Glucose-Capillary: 159 mg/dL — ABNORMAL HIGH (ref 70–99)
Glucose-Capillary: 119 mg/dL — ABNORMAL HIGH (ref 70–99)
Glucose-Capillary: 274 mg/dL — ABNORMAL HIGH (ref 70–99)
Glucose-Capillary: 299 mg/dL — ABNORMAL HIGH (ref 70–99)

## 2022-10-23 LAB — BASIC METABOLIC PANEL
Anion gap: 5 (ref 5–15)
BUN: 72 mg/dL — ABNORMAL HIGH (ref 8–23)
CO2: 23 mmol/L (ref 22–32)
Calcium: 8 mg/dL — ABNORMAL LOW (ref 8.9–10.3)
Chloride: 111 mmol/L (ref 98–111)
Creatinine, Ser: 3.65 mg/dL — ABNORMAL HIGH (ref 0.44–1.00)
GFR, Estimated: 12 mL/min — ABNORMAL LOW (ref >60)
Glucose, Bld: 244 mg/dL — ABNORMAL HIGH (ref 70–99)
Potassium: 3.9 mmol/L (ref 3.5–5.1)
Sodium: 139 mmol/L (ref 135–145)

## 2022-10-23 MED ORDER — PREDNISONE 10 MG PO TABS
10.0000 mg | ORAL_TABLET | Freq: Every day | ORAL | Status: AC
  Administered 2022-10-24: 10 mg via ORAL
  Filled 2022-10-23: qty 1

## 2022-10-23 MED ORDER — INSULIN GLARGINE-YFGN 100 UNIT/ML ~~LOC~~ SOLN
5.0000 [IU] | Freq: Once | SUBCUTANEOUS | Status: AC
  Administered 2022-10-23: 5 [IU] via SUBCUTANEOUS
  Filled 2022-10-23: qty 0.05

## 2022-10-23 MED ORDER — SODIUM BICARBONATE 650 MG PO TABS
650.0000 mg | ORAL_TABLET | Freq: Two times a day (BID) | ORAL | Status: DC
  Administered 2022-10-23 – 2022-10-25 (×4): 650 mg via ORAL
  Filled 2022-10-23 (×4): qty 1

## 2022-10-23 NOTE — Progress Notes (Signed)
[facility]  ROUNDING NOTE   Subjective:  Patient with past medical history of diabetes mellitus type 2, hypertension, per lipidemia, chronic kidney disease stage IV, CVA with left-sided weakness, anemia of chronic kidney disease who came in with generalized weakness and bilateral foot pain.  She was hospitalized in late January for CVA with left-sided weakness with difficulties with walking.  Patient seen sitting up in chair Caregiver at bedside Caregiver states patient's appetite is slowly returning to usual She states mentation has returned to baseline.  UOP 1.9L in past 24 hours  Objective:  Vital signs in last 24 hours:  Temp:  [98.2 F (36.8 C)-98.6 F (37 C)] 98.2 F (36.8 C) (02/03 0816) Pulse Rate:  [81-92] 81 (02/03 0816) Resp:  [16-18] 17 (02/03 0816) BP: (127-148)/(78-83) 148/82 (02/03 0816) SpO2:  [100 %] 100 % (02/03 0816) Weight:  [73.4 kg] 73.4 kg (02/03 0447)  Weight change:  Filed Weights   10/18/22 1359 10/23/22 0447  Weight: 72.6 kg 73.4 kg    Intake/Output: I/O last 3 completed shifts: In: 44 [P.O.:237] Out: 3000 [Urine:3000]   Intake/Output this shift:  Total I/O In: -  Out: 500 [Urine:500]  Physical Exam: General: No acute distress  Head: Normocephalic, atraumatic. Moist oral mucosal membranes  Lungs:  Clear to auscultation, normal effort  Heart: S1S2 no rubs  Abdomen:  Soft, nontender, bowel sounds present  Extremities: Trace peripheral edema.  Neurologic: Awake, alert, following commands  Skin: No acute rash  Access: No hemodialysis access    Basic Metabolic Panel: Recent Labs  Lab 10/19/22 0228 10/20/22 0353 10/21/22 0604 10/22/22 0427 10/23/22 0447  NA 133* 136 139 141 139  K 3.9 4.4 4.0 3.9 3.9  CL 108 116* 110 112* 111  CO2 18* 16* 23 21* 23  GLUCOSE 158* 127* 190* 130* 244*  BUN 48* 64* 78* 76* 72*  CREATININE 3.39* 3.98* 3.99* 3.83* 3.65*  CALCIUM 7.8* 7.5* 7.5* 7.9* 8.0*     Liver Function  Tests: Recent Labs  Lab 10/18/22 0954  AST 28  ALT 23  ALKPHOS 69  BILITOT 0.9  PROT 8.0  ALBUMIN 3.3*    No results for input(s): "LIPASE", "AMYLASE" in the last 168 hours. No results for input(s): "AMMONIA" in the last 168 hours.  CBC: Recent Labs  Lab 10/18/22 0954 10/19/22 0228 10/20/22 0353 10/21/22 0604 10/22/22 0427 10/23/22 0447  WBC 8.3 7.1 8.6 8.2 8.7 9.5  NEUTROABS 5.6  --   --   --   --   --   HGB 10.9* 9.3* 8.7* 8.1* 7.9* 7.7*  HCT 33.9* 28.4* 26.5* 24.0* 23.9* 23.4*  MCV 93.4 90.4 90.1 89.6 90.2 89.7  PLT 171 148* 151 142* 141* 140*     Cardiac Enzymes: No results for input(s): "CKTOTAL", "CKMB", "CKMBINDEX", "TROPONINI" in the last 168 hours.  BNP: Invalid input(s): "POCBNP"  CBG: Recent Labs  Lab 10/22/22 1914 10/22/22 2157 10/23/22 0343 10/23/22 0822 10/23/22 1149  GLUCAP 266* 262* 76* 159* 119*     Microbiology: Results for orders placed or performed during the hospital encounter of 10/18/22  Resp panel by RT-PCR (RSV, Flu A&B, Covid) Anterior Nasal Swab     Status: None   Collection Time: 10/18/22  9:59 AM   Specimen: Anterior Nasal Swab  Result Value Ref Range Status   SARS Coronavirus 2 by RT PCR NEGATIVE NEGATIVE Final    Comment: (NOTE) SARS-CoV-2 target nucleic acids are NOT DETECTED.  The SARS-CoV-2 RNA is generally detectable in  upper respiratory specimens during the acute phase of infection. The lowest concentration of SARS-CoV-2 viral copies this assay can detect is 138 copies/mL. A negative result does not preclude SARS-Cov-2 infection and should not be used as the sole basis for treatment or other patient management decisions. A negative result may occur with  improper specimen collection/handling, submission of specimen other than nasopharyngeal swab, presence of viral mutation(s) within the areas targeted by this assay, and inadequate number of viral copies(<138 copies/mL). A negative result must be combined  with clinical observations, patient history, and epidemiological information. The expected result is Negative.  Fact Sheet for Patients:  EntrepreneurPulse.com.au  Fact Sheet for Healthcare Providers:  IncredibleEmployment.be  This test is no t yet approved or cleared by the [place] FDA and  has been authorized for detection and/or diagnosis of SARS-CoV-2 by FDA under an Emergency Use Authorization (EUA). This EUA will remain  in effect (meaning this test can be used) for the duration of the COVID-19 declaration under Section 564(b)(1) of the Act, 21 U.S.C.section 360bbb-3(b)(1), unless the authorization is terminated  or revoked sooner.       Influenza A by PCR NEGATIVE NEGATIVE Final   Influenza B by PCR NEGATIVE NEGATIVE Final    Comment: (NOTE) The Xpert Xpress SARS-CoV-2/FLU/RSV plus assay is intended as an aid in the diagnosis of influenza from Nasopharyngeal swab specimens and should not be used as a sole basis for treatment. Nasal washings and aspirates are unacceptable for Xpert Xpress SARS-CoV-2/FLU/RSV testing.  Fact Sheet for Patients: EntrepreneurPulse.com.au  Fact Sheet for Healthcare Providers: IncredibleEmployment.be  This test is not yet approved or cleared by the [place] FDA and has been authorized for detection and/or diagnosis of SARS-CoV-2 by FDA under an Emergency Use Authorization (EUA). This EUA will remain in effect (meaning this test can be used) for the duration of the COVID-19 declaration under Section 564(b)(1) of the Act, 21 U.S.C. section 360bbb-3(b)(1), unless the authorization is terminated or revoked.     Resp Syncytial Virus by PCR NEGATIVE NEGATIVE Final    Comment: (NOTE) Fact Sheet for Patients: EntrepreneurPulse.com.au  Fact Sheet for Healthcare Providers: IncredibleEmployment.be  This test is not yet approved  or cleared by the [place] FDA and has been authorized for detection and/or diagnosis of SARS-CoV-2 by FDA under an Emergency Use Authorization (EUA). This EUA will remain in effect (meaning this test can be used) for the duration of the COVID-19 declaration under Section 564(b)(1) of the Act, 21 U.S.C. section 360bbb-3(b)(1), unless the authorization is terminated or revoked.  Performed at [facility], [address], [place], [place] [address]   Urine Culture     Status: Abnormal   Collection Time: 10/18/22 11:06 AM   Specimen: Urine, Clean Catch  Result Value Ref Range Status   Specimen Description   Final    URINE, CLEAN CATCH Performed at [facility], [place] [address] [place], [place] [address]    Special Requests   Final    NONE Reflexed from [phone] [phone] Performed at [facility], [place], [place] [address]    Culture 50,000 COLONIES/mL PROTEUS MIRABILIS (A)  Final   Report Status 10/21/2022 FINAL  Final   Organism ID, Bacteria PROTEUS MIRABILIS (A)  Final      Susceptibility   Proteus mirabilis - MIC*    AMPICILLIN <=2 SENSITIVE Sensitive     CEFAZOLIN 8 SENSITIVE Sensitive     CEFEPIME <=0.12 SENSITIVE Sensitive  CEFTRIAXONE <=0.25 SENSITIVE Sensitive     CIPROFLOXACIN <=0.25 SENSITIVE Sensitive     GENTAMICIN <=1 SENSITIVE Sensitive     IMIPENEM 2 SENSITIVE Sensitive     NITROFURANTOIN 128 RESISTANT Resistant     TRIMETH/SULFA <=20 SENSITIVE Sensitive     AMPICILLIN/SULBACTAM <=2 SENSITIVE Sensitive     PIP/TAZO <=4 SENSITIVE Sensitive     * 50,000 COLONIES/mL PROTEUS MIRABILIS    Coagulation Studies: No results for input(s): "LABPROT", "INR" in the last 72 hours.  Urinalysis: No results for input(s): "COLORURINE", "LABSPEC", "PHURINE", "GLUCOSEU", "HGBUR", "BILIRUBINUR", "KETONESUR", "PROTEINUR", "UROBILINOGEN", "NITRITE", "LEUKOCYTESUR" in the last 72 hours.  Invalid input(s): "APPERANCEUR"     Imaging: No  results found.   Medications:      amLODipine  10 mg Oral Daily   aspirin EC  81 mg Oral Daily   clopidogrel  75 mg Oral Daily   cyanocobalamin  1,000 mcg Oral Daily   feeding supplement  237 mL Oral TID BM   heparin  5,000 Units Subcutaneous Q8H   insulin aspart  0-5 Units Subcutaneous QHS   insulin aspart  0-9 Units Subcutaneous TID WC   multivitamin with minerals  1 tablet Oral Daily   rosuvastatin  40 mg Oral QPM   sodium bicarbonate  650 mg Oral TID   acetaminophen, hydrALAZINE, ondansetron (ZOFRAN) IV, oxyCODONE-acetaminophen, polyethylene glycol  Assessment/ Plan:  82 y.o. female with past medical history of diabetes mellitus type 2, hypertension, lipidemia, chronic kidney disease stage IV, CVA with left-sided weakness, anemia chronic kidney disease who presented with generalized weakness and bilateral foot pain.  1.  Acute kidney injury/chronic kidney disease stage IV baseline creatinine 1.98 with EGFR of 25.  Chronic kidney disease likely secondary to diabetic nephropathy.  Acute kidney injury now may be due to acute tubular necrosis from relative hypotension.    Creatinine continues to slowly improve.  Adequate urine output noted.  Will stop IV fluids for now.  Patient cleared to discharge from renal stance with close outpatient follow-up.  Will schedule outpatient appointment in our office in 1 to 2 weeks.  2.  Acute metabolic acidosis.  Serum bicarbonate corrected.  3.  Anemia of chronic kidney disease.  Hemoglobin 7.7 today. Will address [facility] outpatient.    LOS: [place] 2/3/202412:29 PM

## 2022-10-23 NOTE — TOC Progression Note (Addendum)
Transition of Care [facility]) - Progression Note    Patient Details  Name: Alicia Garner MRN: [id number] Date of Birth: 11-Apr-1941  Transition of Care [facility]) CM/SW Contact  Izola Price, RN Phone Number: 10/23/2022, 12:19 PM  Clinical Narrative: 2/3: [facility] to see if can accept patient today per provider that patient medically ready. Receptionist will contact AC and give message as she is not on site to see if accepting over the weekend. Call back number for RN CM given. Simmie Davies RN CM    2/3 at 9024: Courtney from [facility] called back and said TOC would need to contact Admission Coordinator Monday, but that patient could likely be admitted Monday. Simmie Davies RN CM   Expected Discharge Plan: Skilled Nursing Facility Barriers to Discharge: Insurance Authorization  Expected Discharge Plan and Services                                               Social Determinants of Health (SDOH) Interventions SDOH Screenings   Food Insecurity: No Food Insecurity (10/18/2022)  Housing: Low Risk  (10/18/2022)  Transportation Needs: No Transportation Needs (10/18/2022)  Utilities: Not At Risk (10/18/2022)  Tobacco Use: Low Risk  (10/18/2022)    Readmission Risk Interventions    12/05/2021    3:14 PM  Readmission Risk Prevention Plan  Transportation Screening Complete  PCP or Specialist Appt within 5-7 Days Complete  Home Care Screening Complete  Medication Review (RN CM) Complete

## 2022-10-23 NOTE — Progress Notes (Signed)
Occupational Therapy Treatment Patient Details Name: Alicia Garner MRN: [id number] DOB: [birth year] Today's Date: 10/23/2022   History of present illness presented to ER secondary to generalized weakness, bilat foot pain; admitted for management of acute renal failure with stage 4 CKD.  Of note, recently hospitalized (1/22-24) due to acute CVA (R BG/internal capsule) infarct   OT comments  Upon entering session, pt semi-reclined in bed and agreeable to OT. Pt was oriented to self only this date. She required increased time and repetition to follow one step commands. Pt engaged in bed level grooming tasks with set up-supervision. Pt was then instructed in BUE/LE ROM exercises (see details below). AAROM required for LUE 2/2 residual deficits from CVA. Pt tolerated well. Pt left as received with all needs in reach. Pt is making progress toward goal completion. D/C recommendation remains appropriate. OT will continue to follow acutely.    Recommendations for follow up therapy are one component of a multi-disciplinary discharge planning process, led by the attending physician.  Recommendations may be updated based on patient status, additional functional criteria and insurance authorization.    Follow Up Recommendations  Skilled nursing-short term rehab (<3 hours/day)     Assistance Recommended at Discharge Frequent or constant Supervision/Assistance  Patient can return home with the following  A lot of help with walking and/or transfers;A lot of help with bathing/dressing/bathroom;Assistance with cooking/housework;Direct supervision/assist for medications management;Assist for transportation;Direct supervision/assist for financial management;Help with stairs or ramp for entrance   Equipment Recommendations  None recommended by OT    Recommendations for Other Services      Precautions / Restrictions Precautions Precautions: Fall Restrictions Weight Bearing Restrictions: No       Mobility Bed  Mobility                    Transfers                         Balance                                           ADL either performed or assessed with clinical judgement   ADL Overall ADL's : Needs assistance/impaired     Grooming: Set up;Supervision/safety;Bed level;Wash/dry face                                      Extremity/Trunk Assessment Upper Extremity Assessment Upper Extremity Assessment: Generalized weakness   Lower Extremity Assessment Lower Extremity Assessment: Generalized weakness        Vision Patient Visual Report: No change from baseline     Perception     Praxis      Cognition Arousal/Alertness: Awake/alert Behavior During Therapy: Flat affect Overall Cognitive Status: History of cognitive impairments - at baseline                                 General Comments: Oriented to self only, stated current location is "home", reported time of day was morning & she just ate breakfast. Increased time/repetition required to follow one step commands.        Exercises Other Exercises Other Exercises: Pt engaged in BUE/LE ROM exercises (x10 each): wrist flexion/extension, forearm pronation/supination, elbow flexion/extension, shoulder  flexion, ankle pumps. AAROM required for LUE.    Shoulder Instructions       General Comments      Pertinent Vitals/ Pain       Pain Assessment Pain Assessment: No/denies pain  Home Living                                          Prior Functioning/Environment              Frequency  Min 2X/week        Progress Toward Goals  OT Goals(current goals can now be found in the care plan section)  Progress towards OT goals: Progressing toward goals  Acute Rehab OT Goals Patient Stated Goal: go home OT Goal Formulation: With patient Time For Goal Achievement: 11/02/22 Potential to Achieve Goals: Good  Plan Discharge plan  remains appropriate;Frequency remains appropriate    Co-evaluation                 AM-PAC OT "6 Clicks" Daily Activity     Outcome Measure   Help from another person eating meals?: A Little Help from another person taking care of personal grooming?: A Little Help from another person toileting, which includes using toliet, bedpan, or urinal?: A Lot Help from another person bathing (including washing, rinsing, drying)?: A Lot Help from another person to put on and taking off regular upper body clothing?: A Little Help from another person to put on and taking off regular lower body clothing?: A Lot 6 Click Score: 15    End of Session    OT Visit Diagnosis: Unsteadiness on feet (R26.81);Other abnormalities of gait and mobility (R26.89);Muscle weakness (generalized) (M62.81);Other symptoms and signs involving cognitive function   Activity Tolerance Patient tolerated treatment well   Patient Left in bed;with call bell/phone within reach;with bed alarm set   Nurse Communication Mobility status        Time: 6767-2094 OT Time Calculation (min): 10 min  Charges: OT General Charges $OT Visit: 1 Visit OT Treatments $Self Care/Home Management : 8-22 mins  [facility] MS, OTR/L ascom [phone]  10/23/22, 5:16 PM

## 2022-10-23 NOTE — Progress Notes (Addendum)
Progress Note   Patient: Alicia Garner [id number]:[id number] DOB: 1941-05-08 DOA: 10/18/2022     4 DOS: the patient was seen and examined on 10/23/2022   Brief hospital course: Taken from H&P.    Alicia Garner is a 82 y.o. female with medical history significant of recent admission due to stroke with left-sided weakness, hypertension, hyperlipidemia, diabetes mellitus, CKD stage IV, anemia, who presents with generalized weakness, bilateral foot pain.   Patient was recently hospitalized due to stroke from 1/22 to 1/24.  Patient has left sided weakness and difficulty walking after stroke. Patient came back to the emergency room on 1/25 after being released on 1/24 where she was complained of decreased p.o. intake.  Patient was given 1 L of fluids and had improvement of her blood pressure and so she was discharged.   Today, pt comes back due to generalized weakness in the past 3 days, poor appetite and decreased oral intake.    Data reviewed independently and ED Course: pt was found to have WBC 8.3, lactic acid 12, negative PCR for COVID and flu.  Worsening renal function with creatinine 3.29, BUN 43, GFR 14 (recent baseline creatinine 2.43)., Oxygen saturation 96% on room air, temperature 99.6, blood pressure 115/70, heart rate 92, RR 15.  CT of head is negative for acute abnormality, but showed previous infarction . EKG: Sinus rhythm, QTc 466, T wave inversion only in lead III, LAD, poor R wave progression, LVH.    1/30: Vital stable.  Renal function continued to get worse despite getting some IV fluid.  Non anion gap metabolic acidosis with bicarb of 18 secondary to AKI and CKD.  Uric acid mildly elevated at 7.2.  BNP normal at 37.6.  Urinary output of 800 recorded.  Nephrology was consulted. PT is recommending SNF.   Assessment and Plan: * Acute renal failure superimposed on stage 4 chronic kidney disease ([name]) Metabolic acidosis Most likely prerenal due to dehydration and continue taking Cozaar.  Patient  was also using Voltaren gel very regularly.  Renal function appears to have reached nadir . Bicarb improved. Renal u/s no signs obstruction. Uop adequate Cozaar all and Voltaren gel was held -Nephrology following, no plans for dialysis, ok for discharge w/ nephro f/u 1-2 weeks - cont sodium bicarb tablets, will decrease to bid -Monitor renal function -Avoid nephrotoxins - now off fluids  Gout Concern of gout flare in feet.  Mildly elevated uric acid.  Pain improved with steroid. Home colchicine was held due to worsening renal function.  Procalcitonin 0.16. Symptoms resolved - continue prednisone (steroids started 1/29), will continue weaning, 10 mg tomorrow  CVA (cerebral vascular accident) [facility]) Recent diagnosis -Continue aspirin, Plavix and Crestor  Generalized weakness PT is recommending SNF, has a bed and insurance auth, per TOC can't discharge this weekend, plan will be d/c Monday  Essential hypertension Blood pressure mild elevation today Holding home Cozaar due to worsening renal function. -As needed hydralazine  Chronic diastolic CHF (congestive heart failure) ([place]) No concern of exacerbation.  Clinically appears euvolemic and BNP normal at 37. 2D echo 10/12/2022 showed EF of 55-60 with grade 1 diastolic dysfunction.  -Monitor volume status closely  Type II diabetes mellitus with renal manifestations ([name]) Seems well-controlled with A1c of 6.1.  Patient was on Januvia at home. Glucose elevated today with steroids -SSI, will add long-acting  HLD (hyperlipidemia) -Continue Crestor  Normocytic anemia Hemoglobin decreased to 7.9 this morning, no bleeding.  Worsening renal function likely the culprit.  All cell lines decreased. -  Monitor hemoglobin - may need esa, defer to nephrology -Transfuse if below 7   Subjective: Patient was seen and examined today.  Denies any pain.  Oriented to self only.  Denies pain tolerating diet.  Physical Exam: Vitals:   10/22/22 1537  10/23/22 0300 10/23/22 0447 10/23/22 0816  BP: 139/78 127/83  (!) 148/82  Pulse: 92   81  Resp: 16 18  17   Temp: 98.2 F (36.8 C) 98.6 F (37 C)  98.2 F (36.8 C)  TempSrc:  Oral    SpO2: 100% 100%  100%  Weight:   73.4 kg   Height:       General.  Chronically ill-appearing elderly lady, in no acute distress. Pulmonary.  Lungs clear bilaterally, normal respiratory effort. CV.  Regular rate and rhythm, no JVD, rub or murmur. Abdomen.  Soft, nontender, nondistended, BS positive. CNS.  Alert and oriented to self only.  No focal neurologic deficit. Extremities.  Trace LE edema, no cyanosis, pulses intact and symmetrical. Psychiatry.  Judgment and insight appears impaired.  Data Reviewed: Prior data reviewed  Family Communication: Discussed with daughter tameka @ bedside 2/3  Disposition: Status is: Inpatient Remains inpatient appropriate because: unsafe d/c plan  Planned Discharge Destination: Skilled nursing facility  DVT prophylaxis.  Subcu heparin Time spent: 30  Author: Desma Maxim, MD 10/23/2022 12:42 PM  For on call review [email or web address].

## 2022-10-23 NOTE — Plan of Care (Signed)

## 2022-10-24 DIAGNOSIS — N184 Chronic kidney disease, stage 4 (severe): Secondary | ICD-10-CM | POA: Diagnosis not present

## 2022-10-24 DIAGNOSIS — N179 Acute kidney failure, unspecified: Secondary | ICD-10-CM | POA: Diagnosis not present

## 2022-10-24 LAB — GLUCOSE, CAPILLARY
Glucose-Capillary: 138 mg/dL — ABNORMAL HIGH (ref 70–99)
Glucose-Capillary: 136 mg/dL — ABNORMAL HIGH (ref 70–99)
Glucose-Capillary: 223 mg/dL — ABNORMAL HIGH (ref 70–99)
Glucose-Capillary: 234 mg/dL — ABNORMAL HIGH (ref 70–99)

## 2022-10-24 LAB — BASIC METABOLIC PANEL
Anion gap: 5 (ref 5–15)
BUN: 73 mg/dL — ABNORMAL HIGH (ref 8–23)
CO2: 21 mmol/L — ABNORMAL LOW (ref 22–32)
Calcium: 8.2 mg/dL — ABNORMAL LOW (ref 8.9–10.3)
Chloride: 113 mmol/L — ABNORMAL HIGH (ref 98–111)
Creatinine, Ser: 3.55 mg/dL — ABNORMAL HIGH (ref 0.44–1.00)
GFR, Estimated: 12 mL/min — ABNORMAL LOW (ref >60)
Glucose, Bld: 178 mg/dL — ABNORMAL HIGH (ref 70–99)
Potassium: 4 mmol/L (ref 3.5–5.1)
Sodium: 139 mmol/L (ref 135–145)

## 2022-10-24 MED ORDER — ATENOLOL 25 MG PO TABS
25.0000 mg | ORAL_TABLET | Freq: Every day | ORAL | Status: DC
  Administered 2022-10-24 – 2022-10-26 (×3): 25 mg via ORAL
  Filled 2022-10-24 (×3): qty 1

## 2022-10-24 NOTE — Plan of Care (Signed)

## 2022-10-24 NOTE — Progress Notes (Signed)
Physical Therapy Treatment Patient Details Name: Alicia Garner MRN: [id number] DOB: 11/07/1940 Today's Date: 10/24/2022   History of Present Illness presented to ER secondary to generalized weakness, bilat foot pain; admitted for management of acute renal failure with stage 4 CKD.  Of note, recently hospitalized (1/22-24) due to acute CVA (R BG/internal capsule) infarct    PT Comments    Participated in exercises as described below.  To EOB with mod a x 1.  Transfers with squat pivot to recliner at bedside.   Pt continues to need heavy verbal and tactile cues for ex, bed mobility and transfers.    Recommendations for follow up therapy are one component of a multi-disciplinary discharge planning process, led by the attending physician.  Recommendations may be updated based on patient status, additional functional criteria and insurance authorization.  Follow Up Recommendations  Skilled nursing-short term rehab (<3 hours/day)     Assistance Recommended at Discharge    Patient can return home with the following Two people to help with walking and/or transfers;A lot of help with bathing/dressing/bathroom;Assist for transportation;Help with stairs or ramp for entrance;Direct supervision/assist for financial management;Direct supervision/assist for medications management   Equipment Recommendations       Recommendations for Other Services       Precautions / Restrictions Precautions Precautions: Fall Restrictions Weight Bearing Restrictions: No     Mobility  Bed Mobility Overal bed mobility: Needs Assistance Bed Mobility: Supine to Sit     Supine to sit: Mod assist     General bed mobility comments: assist for trunk and B LE's; vc's for technique    Transfers Overall transfer level: Needs assistance Equipment used: 1 person hand held assist Transfers: Sit to/from Stand, Bed to chair/wheelchair/BSC Sit to Stand: Max assist     Squat pivot transfers: Max assist     General  transfer comment: does not stand fully but is able to transition to chair with arm rest flipped up for ease and safety.    Ambulation/Gait               General Gait Details: pt unable to take any steps in place   Stairs             Wheelchair Mobility    Modified Rankin (Stroke Patients Only)       Balance Overall balance assessment: Needs assistance Sitting-balance support: No upper extremity supported, Feet supported Sitting balance-Leahy Scale: Fair Sitting balance - Comments: steady static sitting; occasional vc's to correct posture d/t posterior lean tendancy (minimal L lean noted)   Standing balance support: Single extremity supported Standing balance-Leahy Scale: Poor Standing balance comment: assist for balance and upright d/t L lean                            Cognition   Behavior During Therapy: Flat affect Overall Cognitive Status: History of cognitive impairments - at baseline                                 General Comments: offers no verbalizaions other than short ye/no answers during session, inc time to follow cues.        Exercises Other Exercises Other Exercises: BLE AAROM x 10 with minimal ranges and max verbal and tactile cues for pt.    General Comments        Pertinent Vitals/Pain Pain Assessment Pain Assessment: No/denies  pain    Home Living                          Prior Function            PT Goals (current goals can now be found in the care plan section) Progress towards PT goals: Progressing toward goals    Frequency    Min 2X/week      PT Plan Current plan remains appropriate    Co-evaluation              AM-PAC PT "6 Clicks" Mobility   Outcome Measure  Help needed turning from your back to your side while in a flat bed without using bedrails?: A Lot Help needed moving from lying on your back to sitting on the side of a flat bed without using bedrails?: A  Lot Help needed moving to and from a bed to a chair (including a wheelchair)?: A Lot Help needed standing up from a chair using your arms (e.g., wheelchair or bedside chair)?: A Lot Help needed to walk in hospital room?: Total Help needed climbing 3-5 steps with a railing? : Total 6 Click Score: 10    End of Session Equipment Utilized During Treatment: Gait belt Activity Tolerance: Patient tolerated treatment well Patient left: in chair;with call bell/phone within reach;with chair alarm set Nurse Communication: Mobility status;Precautions PT Visit Diagnosis: Difficulty in walking, not elsewhere classified (R26.2);Other abnormalities of gait and mobility (R26.89);Muscle weakness (generalized) (M62.81)     Time: 5208-0223 PT Time Calculation (min) (ACUTE ONLY): 11 min  Charges:  $Therapeutic Activity: 8-22 mins                   Chesley Noon, PTA 10/24/22, 9:45 AM

## 2022-10-24 NOTE — Progress Notes (Signed)
Progress Note   Patient: Alicia Garner [id number]:[id number] DOB: May 01, 1941 DOA: 10/18/2022     5 DOS: the patient was seen and examined on 10/24/2022   Brief hospital course: Taken from H&P.    Laketra Bowdish is a 82 y.o. female with medical history significant of recent admission due to stroke with left-sided weakness, hypertension, hyperlipidemia, diabetes mellitus, CKD stage IV, anemia, who presents with generalized weakness, bilateral foot pain.   Patient was recently hospitalized due to stroke from 1/22 to 1/24.  Patient has left sided weakness and difficulty walking after stroke. Patient came back to the emergency room on 1/25 after being released on 1/24 where she was complained of decreased p.o. intake.  Patient was given 1 L of fluids and had improvement of her blood pressure and so she was discharged.   Today, pt comes back due to generalized weakness in the past 3 days, poor appetite and decreased oral intake.    Data reviewed independently and ED Course: pt was found to have WBC 8.3, lactic acid 12, negative PCR for COVID and flu.  Worsening renal function with creatinine 3.29, BUN 43, GFR 14 (recent baseline creatinine 2.43)., Oxygen saturation 96% on room air, temperature 99.6, blood pressure 115/70, heart rate 92, RR 15.  CT of head is negative for acute abnormality, but showed previous infarction . EKG: Sinus rhythm, QTc 466, T wave inversion only in lead III, LAD, poor R wave progression, LVH.    1/30: Vital stable.  Renal function continued to get worse despite getting some IV fluid.  Non anion gap metabolic acidosis with bicarb of 18 secondary to AKI and CKD.  Uric acid mildly elevated at 7.2.  BNP normal at 37.6.  Urinary output of 800 recorded.  Nephrology was consulted. PT is recommending SNF.   Assessment and Plan: * Acute renal failure superimposed on stage 4 chronic kidney disease ([name]) Metabolic acidosis Most likely prerenal due to dehydration and continue taking Cozaar.  Patient  was also using Voltaren gel very regularly.  Renal function appears to have reached nadir and has slowly improved last few days. Bicarb improved. Renal u/s no signs obstruction. Uop adequate Cozaar all and Voltaren gel was held -Nephrology following, no plans for dialysis, ok for discharge w/ nephro f/u 1-2 weeks - cont sodium bicarb tablets  -Monitor renal function -Avoid nephrotoxins - now off fluids  Gout Concern of gout flare in feet.  Mildly elevated uric acid.  Pain improved with steroid. Home colchicine was held due to worsening renal function.  Procalcitonin 0.16. Symptoms resolved - prednisone wean ends today  CVA (cerebral vascular accident) ([place]) Recent diagnosis -Continue aspirin, Plavix and Crestor  Generalized weakness PT is recommending SNF, has a bed and insurance auth, per [facility] can't discharge this weekend, plan will be d/c Monday  Essential hypertension Blood pressure mild elevation today Holding home Cozaar due to worsening renal function. - cont amlodipine, will add atenolol  Chronic diastolic CHF (congestive heart failure) ([name]) No concern of exacerbation.  Clinically appears euvolemic and BNP normal at 37. 2D echo 10/12/2022 showed EF of 55-60 with grade 1 diastolic dysfunction.  -Monitor volume status closely  Type II diabetes mellitus with renal manifestations ([name]) Seems well-controlled with A1c of 6.1.  Patient was on Januvia at home. Glucose elevated today with steroids -SSI, will add long-acting  HLD (hyperlipidemia) -Continue Crestor  Normocytic anemia Hemoglobin decreased to 7s morning, no bleeding.  Worsening renal function likely the culprit.  All cell lines decreased. -Monitor hemoglobin -  may need esa, defer to nephrology -Transfuse if below 7   Subjective: Patient was seen and examined today.  Denies any pain.  Oriented to self only.  Denies pain tolerating diet.  Physical Exam: Vitals:   10/23/22 1612 10/24/22 0006 10/24/22 0500  10/24/22 0740  BP: 134/80 (!) 151/93  (!) 154/95  Pulse: 83 86  82  Resp: 17 16  16   Temp: 97.6 F (36.4 C) 97.9 F (36.6 C)  98.4 F (36.9 C)  TempSrc:      SpO2: 100% 100%  100%  Weight:   73.2 kg   Height:       General.  Chronically ill-appearing elderly lady, in no acute distress. Pulmonary.  Lungs clear bilaterally, normal respiratory effort. CV.  Regular rate and rhythm, no JVD, rub or murmur. Abdomen.  Soft, nontender, nondistended, BS positive. CNS.  Alert and oriented to self only.  No focal neurologic deficit. Extremities.  Trace LE edema, no cyanosis, pulses intact and symmetrical. Psychiatry.  Judgment and insight appears impaired.  Data Reviewed: Prior data reviewed  Family Communication: Discussed with daughter tameka telephonically 2/4  Disposition: Status is: Inpatient Remains inpatient appropriate because: unsafe d/c plan  Planned Discharge Destination: Skilled nursing facility  DVT prophylaxis.  Subcu heparin Time spent: 30  Author: Desma Maxim, MD 10/24/2022 2:11 PM  For on call review [email or web address].

## 2022-10-25 DIAGNOSIS — N179 Acute kidney failure, unspecified: Secondary | ICD-10-CM | POA: Diagnosis not present

## 2022-10-25 DIAGNOSIS — N184 Chronic kidney disease, stage 4 (severe): Secondary | ICD-10-CM | POA: Diagnosis not present

## 2022-10-25 LAB — BASIC METABOLIC PANEL
Anion gap: 10 (ref 5–15)
BUN: 69 mg/dL — ABNORMAL HIGH (ref 8–23)
CO2: 18 mmol/L — ABNORMAL LOW (ref 22–32)
Calcium: 8.4 mg/dL — ABNORMAL LOW (ref 8.9–10.3)
Chloride: 112 mmol/L — ABNORMAL HIGH (ref 98–111)
Creatinine, Ser: 3.45 mg/dL — ABNORMAL HIGH (ref 0.44–1.00)
GFR, Estimated: 13 mL/min — ABNORMAL LOW (ref >60)
Glucose, Bld: 183 mg/dL — ABNORMAL HIGH (ref 70–99)
Potassium: 4 mmol/L (ref 3.5–5.1)
Sodium: 140 mmol/L (ref 135–145)

## 2022-10-25 LAB — GLUCOSE, CAPILLARY
Glucose-Capillary: 119 mg/dL — ABNORMAL HIGH (ref 70–99)
Glucose-Capillary: 134 mg/dL — ABNORMAL HIGH (ref 70–99)
Glucose-Capillary: 197 mg/dL — ABNORMAL HIGH (ref 70–99)
Glucose-Capillary: 149 mg/dL — ABNORMAL HIGH (ref 70–99)

## 2022-10-25 MED ORDER — SODIUM BICARBONATE 650 MG PO TABS
650.0000 mg | ORAL_TABLET | Freq: Three times a day (TID) | ORAL | Status: DC
  Administered 2022-10-25 – 2022-10-26 (×3): 650 mg via ORAL
  Filled 2022-10-25 (×3): qty 1

## 2022-10-25 MED ORDER — ATENOLOL 25 MG PO TABS: 25.0000 mg | ORAL_TABLET | Freq: Every day | ORAL | Status: DC

## 2022-10-25 MED ORDER — SORBITOL 70 % SOLN
960.0000 mL | TOPICAL_OIL | ORAL | Status: DC
  Filled 2022-10-25: qty 240

## 2022-10-25 MED ORDER — GLYCERIN (LAXATIVE) 2 G RE SUPP
1.0000 | Freq: Once | RECTAL | Status: AC
  Administered 2022-10-25: 1 via RECTAL
  Filled 2022-10-25: qty 1

## 2022-10-25 MED ORDER — LINAGLIPTIN 5 MG PO TABS: 5.0000 mg | ORAL_TABLET | Freq: Every day | ORAL | Status: DC

## 2022-10-25 MED ORDER — LACTULOSE 10 GM/15ML PO SOLN
30.0000 g | Freq: Once | ORAL | Status: DC | PRN
  Filled 2022-10-25: qty 60

## 2022-10-25 MED ORDER — LACTULOSE 10 GM/15ML PO SOLN
30.0000 g | Freq: Once | ORAL | Status: AC
  Administered 2022-10-25: 30 g via ORAL
  Filled 2022-10-25: qty 60

## 2022-10-25 MED ORDER — FLEET ENEMA 7-19 GM/118ML RE ENEM
1.0000 | ENEMA | Freq: Once | RECTAL | Status: AC
  Administered 2022-10-25: 1 via RECTAL

## 2022-10-25 NOTE — Plan of Care (Signed)
  Problem: Education: Goal: Knowledge of disease or condition will improve Outcome: Adequate for Discharge   Problem: Education: Goal: Knowledge of secondary prevention will improve (MUST DOCUMENT ALL) Outcome: Adequate for Discharge   Problem: Education: Goal: Knowledge of patient specific risk factors will improve [place] N/A or DELETE if not current risk factor) Outcome: Adequate for Discharge   Problem: Ischemic Stroke/TIA Tissue Perfusion: Goal: Complications of ischemic stroke/TIA will be minimized Outcome: Adequate for Discharge   Problem: Coping: Goal: Will verbalize positive feelings about self Outcome: Adequate for Discharge   Problem: Coping: Goal: Will identify appropriate support needs Outcome: Adequate for Discharge   Problem: Health Behavior/Discharge Planning: Goal: Ability to manage health-related needs will improve Outcome: Adequate for Discharge   Problem: Health Behavior/Discharge Planning: Goal: Goals will be collaboratively established with patient/family Outcome: Adequate for Discharge   Problem: Self-Care: Goal: Ability to participate in self-care as condition permits will improve Outcome: Adequate for Discharge   Problem: Self-Care: Goal: Verbalization of feelings and concerns over difficulty with self-care will improve Outcome: Adequate for Discharge

## 2022-10-25 NOTE — TOC Progression Note (Signed)
Transition of Care [facility]) - Progression Note    Patient Details  Name: Alicia Garner MRN: [id number] Date of Birth: 07/13/1941  Transition of Care [facility]) CM/SW Contact  Conception Oms, RN Phone Number: 10/25/2022, 10:51 AM  Clinical Narrative:   [name] the patient;s daughter called  I let her know the room Number 604 and that she will dc today    Expected Discharge Plan: Skilled Nursing Facility Barriers to Discharge: Insurance Authorization  Expected Discharge Plan and Services         Expected Discharge Date: 10/25/22                                     Social Determinants of Health (SDOH) Interventions SDOH Screenings   Food Insecurity: No Food Insecurity (10/18/2022)  Housing: Low Risk  (10/18/2022)  Transportation Needs: No Transportation Needs (10/18/2022)  Utilities: Not At Risk (10/18/2022)  Tobacco Use: Low Risk  (10/18/2022)    Readmission Risk Interventions    12/05/2021    3:14 PM  Readmission Risk Prevention Plan  Transportation Screening Complete  PCP or Specialist Appt within 5-7 Days Complete  Home Care Screening Complete  Medication Review (RN CM) Complete

## 2022-10-25 NOTE — TOC Progression Note (Signed)
Transition of Care [facility]) - Progression Note    Patient Details  Name: Alicia Garner MRN: [id number] Date of Birth: Feb 05, 1941  Transition of Care [facility]) CM/SW [place], RN Phone Number: 10/25/2022, 10:47 AM  Clinical Narrative:     Attempted to reach [name] the patients daughter to inform of the patients dc and room number, unable to reach, left a VM asking for a call back  Expected Discharge Plan: [place] Barriers to Discharge: Insurance Authorization  Expected Discharge Plan and Services         Expected Discharge Date: 10/25/22                                     Social Determinants of Health ([place]) Interventions SDOH Screenings   Food Insecurity: No Food Insecurity (10/18/2022)  Housing: Low Risk  (10/18/2022)  Transportation Needs: No Transportation Needs (10/18/2022)  Utilities: Not At Risk (10/18/2022)  Tobacco Use: Low Risk  (10/18/2022)    Readmission Risk Interventions    12/05/2021    3:14 PM  Readmission Risk Prevention Plan  Transportation Screening Complete  PCP or Specialist Appt within 5-7 Days Complete  Home Care Screening Complete  Medication Review (RN CM) Complete

## 2022-10-25 NOTE — TOC Progression Note (Addendum)
Transition of Care [facility]) - Progression Note    Patient Details  Name: Alicia Garner MRN: [id number] Date of Birth: [birth year]  Transition of Care [facility]) CM/SW [place], RN Phone Number: 10/25/2022, 12:11 PM  Clinical Narrative:   Daughter [name] called and stated they do not want her to go to [facility], She wants her to go locally and said [name] far, I explained that I would have to call EMS and cancel, Call Home care and see if they still have a bed and call ins to get it switched to [facility] if they do, she stated if they do not then they will take her home with [facility] I called EMS to cancel the transport, I notified [name] that she would not be coming I reached out to [name] at [facility] care to see if they still have a bed to offer She will review and call me back Also [facility] to [facility] and called and left a vm for leslie to review     Expected Discharge Plan: [place] Barriers to Discharge: Insurance Authorization  Expected Discharge Plan and Services         Expected Discharge Date: 10/25/22                                     Social Determinants of Health (SDOH) Interventions SDOH Screenings   Food Insecurity: No Food Insecurity (10/18/2022)  Housing: Low Risk  (10/18/2022)  Transportation Needs: No Transportation Needs (10/18/2022)  Utilities: Not At Risk (10/18/2022)  Tobacco Use: Low Risk  (10/18/2022)    Readmission Risk Interventions    12/05/2021    3:14 PM  Readmission Risk Prevention Plan  Transportation Screening Complete  PCP or Specialist Appt within 5-7 Days Complete  Home Care Screening Complete  Medication Review (RN CM) Complete

## 2022-10-25 NOTE — Consult Note (Signed)
   [facility] Inpatient Consult   10/25/2022  Amarri Michaelson 1941/01/04 [id number]  [employer] [ACO] Patient: Mcarthur Rossetti Medicare  *[facility] Liaison remote coverage review for patient admitted to [facility]   Reason for review:  on [facility] banner with less than 7 days readmission list and high risk for unplanned readmission scores  Primary Care Provider:  Clinic, [facility] clinic is not a [facility] provider.   Patient screened for readmission hospitalization.  Review of patient's electronic medical record reveals patient is had previously been outreached by a [facility] RNCM.   Plan:  Patient is for a skilled nursing facility level of care and needs for post hospital care to be met at the facility.    For questions contact:   Natividad Brood, RN BSN [place]  [phone] business mobile phone Toll free office [phone]  *[place]  [phone] Fax number: [phone] [email or web address]

## 2022-10-25 NOTE — Plan of Care (Signed)

## 2022-10-25 NOTE — Discharge Summary (Signed)
Alicia Garner TKP:[id number] DOB: 01/25/1941 DOA: 10/18/2022  PCP: [facility]  Admit date: 10/18/2022 Discharge date: 10/26/2022  Time spent: 35 minutes  Recommendations for Outpatient Follow-up:  Nephrology f/u 1-2 weeks, NEEDS TO BE ARRANGED    Discharge Diagnoses:  Principal Problem:   Acute renal failure superimposed on stage 4 chronic kidney disease ([place]) Active Problems:   Gout   Bilateral foot pain   CVA (cerebral vascular accident) ([place])   Generalized weakness   Essential hypertension   Chronic diastolic CHF (congestive heart failure) ([place])   Type II diabetes mellitus with renal manifestations ([place])   HLD (hyperlipidemia)   Normocytic anemia   Discharge Condition: stable  Diet recommendation: low sodium heart healthy  Filed Weights   10/23/22 0447 10/24/22 0500 10/25/22 0500  Weight: 73.4 kg 73.2 kg 80.5 kg    History of present illness:  From admission h and p Alicia Garner is a 82 y.o. female with medical history significant of recent admission due to stroke with left-sided weakness, hypertension, hyperlipidemia, diabetes mellitus, CKD stage IV, anemia, who presents with generalized weakness, bilateral foot pain.   Patient was recently hospitalized due to stroke from 1/22 to 1/24.  Patient has left sided weakness and difficulty walking after stroke. Patient came back to the emergency room on 1/25 after being released on 1/24 where she was complained of decreased p.o. intake.  Patient was given 1 L of fluids and had improvement of her blood pressure and so she was discharged.   Today, pt comes back due to generalized weakness in the past 3 days, poor appetite and decreased oral intake.   Hospital Course:  Patient presented with acute kidney injury on ckd stage 4 with metabolic acidosis. Etiology not abundantly clear but dehydration and home losartan may have contributed. Here making adequate urine and cr nadir at 4, has improved to about 3.5. Nephrology  followed, did not institute hemodialysis here. We have added sodium bicarb to treat her metabolic acidosis. Hospital course complicated by gout flare in feet treated with steroids that have since been discontinued. Atenolol was added for bp control. Also worsening anemia with hgb in the 7s, no bleeding, this likely secondary to her progressive renal disease. Discharged to skilled nursing, will need nephrology f/u in 1-2 weeks. Given worsening kidney function I have switched her home sitagliptin to linagliptin. Set to discharge on 2/5 but as patient has not had a BM within the last 72 hours SNF did not accept her. Patient was given lactulose and she had a normal BM.   Procedures: none   Consultations: nephrology  Discharge Exam: Vitals:   10/25/22 2235 10/26/22 0736  BP: (!) 143/83 (!) 156/72  Pulse: 75 67  Resp: 18 16  Temp: 99 F (37.2 C) 98.3 F (36.8 C)  SpO2: 96% 100%    General.  Chronically ill-appearing elderly lady, in no acute distress. Pulmonary.  Lungs clear bilaterally, normal respiratory effort. CV.  Regular rate and rhythm, no JVD, rub or murmur. Abdomen.  Soft, nontender, nondistended, BS positive. CNS.  Alert and oriented to self only.  No focal neurologic deficit. Extremities.  Trace LE edema, no cyanosis, pulses intact and symmetrical. Psychiatry.  Judgment and insight appears impaired.  Discharge Instructions   Discharge Instructions     Diet - low sodium heart healthy   Complete by: As directed    Discharge patient   Complete by: As directed    Discharge disposition: 03-Skilled [place]   Discharge patient date: 10/26/2022  Increase activity slowly   Complete by: As directed       Allergies as of 10/26/2022       Reactions   Lisinopril Swelling        Medication List     STOP taking these medications    colchicine 0.6 MG tablet   losartan 50 MG tablet Commonly known as: COZAAR   sitaGLIPtin 25 MG tablet Commonly known as:  JANUVIA       TAKE these medications    amLODipine 10 MG tablet Commonly known as: NORVASC Take 10 mg by mouth daily.   aspirin EC 81 MG tablet Take 81 mg by mouth daily. Swallow whole.   atenolol 25 MG tablet Commonly known as: TENORMIN Take 1 tablet (25 mg total) by mouth daily.   Cholecalciferol 25 MCG (1000 UT) capsule Take by mouth.   clopidogrel 75 MG tablet Commonly known as: PLAVIX Take 1 tablet (75 mg total) by mouth daily.   cyanocobalamin 1000 MCG tablet Take 1 tablet (1,000 mcg total) by mouth daily.   diclofenac Sodium 1 % Gel Commonly known as: VOLTAREN Apply 2 g topically 4 (four) times daily.   linagliptin 5 MG Tabs tablet Commonly known as: TRADJENTA Take 1 tablet (5 mg total) by mouth daily.   polyethylene glycol 17 g packet Commonly known as: MIRALAX / GLYCOLAX Take 17 g by mouth daily as needed for moderate constipation.   rosuvastatin 40 MG tablet Commonly known as: CRESTOR Take 1 tablet (40 mg total) by mouth daily.       Allergies  Allergen Reactions   Lisinopril Swelling    Contact information for follow-up providers     [facility].   Contact information: [place] [address] [phone]         Azzie Glatter, MD Follow up.   Specialty: Internal Medicine Why: this is your former nephrologist, you can call to follow up with him or one of his colleagues in 1-2 weeks Contact information: [place] [address] [phone]         Murlean Iba, MD Follow up on 11/02/2022.   Specialty: Nephrology Why: this is a local nephrologist you can also follow up with, in 1-2 weeksat 1040 Contact information: [place] [address] [phone]              Contact information for after-discharge care     [facility] .   Service: Skilled Nursing Contact information: [place] [phone]                      The results of significant diagnostics from this hospitalization (including imaging, microbiology, ancillary and laboratory) are listed below for reference.    Significant Diagnostic Studies: US RENAL  Result Date: 10/20/2022 CLINICAL DATA:  Acute kidney injury EXAM: RENAL / URINARY TRACT ULTRASOUND COMPLETE COMPARISON:  None Available. FINDINGS: Right Kidney: Renal measurements: 10.6 x 5.1 x 3.7 cm = volume: 105 mL. Echogenicity within normal limits. No mass or hydronephrosis visualized. Simple appearing cyst of the upper pole of the right kidney measuring up to 1.9 cm. Left Kidney: Renal measurements: 9.0 x 4.7 x 3.8 cm = volume: 84.3 mL. Echogenicity within normal limits. No mass or hydronephrosis visualized. Bladder: Appears normal for degree of bladder distention. Other: None. IMPRESSION: No hydronephrosis. Electronically Signed  By: Yetta Glassman M.D.   On: 10/20/2022 14:45   CT HEAD WO CONTRAST (5MM)  Result Date: 10/18/2022 CLINICAL DATA:  Provided history: Mental status change, unknown cause. Additional history provided: Generalized weakness. Prior stroke this month with persistent deficits on left side. EXAM: CT HEAD WITHOUT CONTRAST TECHNIQUE: Contiguous axial images were obtained from the base of the skull through the vertex without intravenous contrast. RADIATION DOSE REDUCTION: This exam was performed according to the departmental dose-optimization program which includes automated exposure control, adjustment of the mA and/or kV according to patient size and/or use of iterative reconstruction technique. COMPARISON:  Prior head CT examinations 10/14/2022 and earlier. Brain MRI 10/11/2022. FINDINGS: Brain: Mild generalized parenchymal atrophy. A known recent infarct within the right basal ganglia/internal capsule was better appreciated on the prior brain MRI of 10/11/2022. Chronic lacunar infarcts  within the bilateral cerebral hemispheric white matter and deep gray nuclei, many of which were better appreciated on the prior MRI. Background advanced chronic small-vessel ischemic changes within the cerebral white matter. There is no acute intracranial hemorrhage. No demarcated cortical infarct. No extra-axial fluid collection. No evidence of an intracranial mass. No midline shift. Vascular: No hyperdense vessel. Atherosclerotic calcifications. Skull: No fracture or aggressive osseous lesion. Sinuses/Orbits: No mass or acute finding within the imaged orbits. No significant paranasal sinus disease at the imaged levels. IMPRESSION: 1. A known recent infarct within the right basal ganglia/internal capsule was better appreciated on the prior brain MRI of 10/11/2022. 2. No evidence of an interval acute intracranial abnormality. 3. Background parenchymal atrophy and chronic small vessel ischemic disease, as described. Electronically Signed   By: Kellie Simmering D.O.   On: 10/18/2022 10:45   CT Head Wo Contrast  Result Date: 10/14/2022 CLINICAL DATA:  Mental status change, unknown cause new confusion, dizziness, recent ischemic stroke EXAM: CT HEAD WITHOUT CONTRAST TECHNIQUE: Contiguous axial images were obtained from the base of the skull through the vertex without intravenous contrast. RADIATION DOSE REDUCTION: This exam was performed according to the departmental dose-optimization program which includes automated exposure control, adjustment of the mA and/or kV according to patient size and/or use of iterative reconstruction technique. COMPARISON:  MRI head 10/11/2022. FINDINGS: Brain: Known recent infarct in the right basal ganglia was better assessed on recent MRI. No evidence of acute infarction, hemorrhage, hydrocephalus, extra-axial collection or mass lesion/mass effect. Patchy white matter hypodensities, nonspecific but compatible with chronic microvascular ischemic disease. Vascular: No hyperdense vessel or  unexpected calcification. Skull: Normal. Negative for fracture or focal lesion. Sinuses/Orbits: No acute finding. IMPRESSION: No acute finding. Known recent infarct in the right basal ganglia was better assessed on recent MRI. Chronic microvascular ischemic disease. Electronically Signed   By: Margaretha Sheffield M.D.   On: 10/14/2022 12:36   US Carotid Bilateral  Result Date: 10/12/2022 CLINICAL DATA:  Initial evaluation for acute stroke. EXAM: BILATERAL CAROTID DUPLEX ULTRASOUND TECHNIQUE: Pearline Cables scale imaging, color Doppler and duplex ultrasound were performed of bilateral carotid and vertebral arteries in the neck. COMPARISON:  Prior brain MRI from 10/11/2022. FINDINGS: Criteria: Quantification of carotid stenosis is based on velocity parameters that correlate the residual internal carotid diameter with NASCET-based stenosis levels, using the diameter of the distal internal carotid lumen as the denominator for stenosis measurement. The following velocity measurements were obtained: RIGHT ICA: 70/19 cm/sec CCA: 62 cm/sec SYSTOLIC ICA/CCA RATIO:  1.1 ECA: 106 cm/sec LEFT ICA: 82/27 cm/sec CCA: 57 cm/sec SYSTOLIC ICA/CCA RATIO:  1.4 ECA: 117 cm/sec RIGHT CAROTID ARTERY: Visualized right CCA  is tortuous with circumferential intimal thickening. Minor atheromatous irregularity about the right carotid bulb. No elevation in peak systolic velocity to suggest hemodynamically significant stenosis. Visualized right ICA tortuous but patent with antegrade flow. RIGHT VERTEBRAL ARTERY:  Patent with antegrade flow. LEFT CAROTID ARTERY: Visualized left CCA is tortuous with circumferential wall thickening. Moderate heterogeneous echogenic plaque about the left carotid bulb. No significant elevation in peak systolic velocity to suggest hemodynamically significant greater than 50% stenosis. Visualized left ICA tortuous but patent with antegrade flow. LEFT VERTEBRAL ARTERY:  Patent with antegrade flow. IMPRESSION: 1.  Mild-to-moderate atheromatous plaque about the carotid bifurcations, left greater than right, but without hemodynamically significant greater than 50% stenosis by sonography. 2. Patent antegrade flow within the vertebral arteries bilaterally. 3. Diffuse tortuosity of the major arterial vasculature of the neck, most commonly seen in the setting of chronic underlying hypertension. Electronically Signed   By: Jeannine Boga M.D.   On: 10/12/2022 17:31   ECHOCARDIOGRAM COMPLETE  Result Date: 10/12/2022    ECHOCARDIOGRAM REPORT   Patient Name:   JAHNAVI MURATORE Date of Exam: 10/12/2022 Medical Rec #:  [id number]   Height:       64.0 in Accession #:    [id number]  Weight:       160.1 lb Date of Birth:  11-11-1940    BSA:          1.780 m Patient Age:    30 years    BP:           146/89 mmHg Patient Gender: F           HR:           71 bpm. Exam Location:  [facility] Procedure: 2D Echo Indications:     stroke  History:         Patient has no prior history of Echocardiogram examinations.                  Arrythmias:Tachycardia; Risk Factors:Diabetes and Hypertension.  Sonographer:     Harvie Junior Referring Phys:  [id number] MWNUUVO AMIN Diagnosing Phys: Kathlyn Sacramento MD  Sonographer Comments: Technically difficult study due to poor echo windows. Image acquisition challenging due to patient body habitus and supine. IMPRESSIONS  1. Left ventricular ejection fraction, by estimation, is 55 to 60%. The left ventricle has normal function. The left ventricle has no regional wall motion abnormalities. There is mild left ventricular hypertrophy. Left ventricular diastolic parameters are consistent with Grade I diastolic dysfunction (impaired relaxation).  2. Right ventricular systolic function is normal. The right ventricular size is normal. Tricuspid regurgitation signal is inadequate for assessing PA pressure.  3. The mitral valve is normal in structure. No evidence of mitral valve regurgitation. No evidence of mitral stenosis.  4.  The aortic valve is normal in structure. Aortic valve regurgitation is not visualized. No aortic stenosis is present.  5. The inferior vena cava is normal in size with greater than 50% respiratory variability, suggesting right atrial pressure of 3 mmHg. FINDINGS  Left Ventricle: Left ventricular ejection fraction, by estimation, is 55 to 60%. The left ventricle has normal function. The left ventricle has no regional wall motion abnormalities. The left ventricular internal cavity size was normal in size. There is  mild left ventricular hypertrophy. Left ventricular diastolic parameters are consistent with Grade I diastolic dysfunction (impaired relaxation). Right Ventricle: The right ventricular size is normal. No increase in right ventricular wall thickness. Right ventricular systolic function is normal. Tricuspid regurgitation signal is  inadequate for assessing PA pressure. The tricuspid regurgitant velocity is 2.15 m/s, and with an assumed right atrial pressure of 3 mmHg, the estimated right ventricular systolic pressure is 70.2 mmHg. Left Atrium: Left atrial size was normal in size. Right Atrium: Right atrial size was normal in size. Pericardium: There is no evidence of pericardial effusion. Mitral Valve: The mitral valve is normal in structure. No evidence of mitral valve regurgitation. No evidence of mitral valve stenosis. Tricuspid Valve: The tricuspid valve is normal in structure. Tricuspid valve regurgitation is not demonstrated. No evidence of tricuspid stenosis. Aortic Valve: The aortic valve is normal in structure. Aortic valve regurgitation is not visualized. No aortic stenosis is present. Aortic valve mean gradient measures 4.0 mmHg. Aortic valve peak gradient measures 7.0 mmHg. Aortic valve area, by VTI measures 2.71 cm. Pulmonic Valve: The pulmonic valve was normal in structure. Pulmonic valve regurgitation is not visualized. No evidence of pulmonic stenosis. Aorta: The aortic root is normal in size  and structure. Venous: The inferior vena cava is normal in size with greater than 50% respiratory variability, suggesting right atrial pressure of 3 mmHg. IAS/Shunts: No atrial level shunt detected by color flow Doppler.  LEFT VENTRICLE PLAX 2D LVIDd:         3.90 cm      Diastology LVIDs:         2.70 cm      LV e' medial:    9.79 cm/s LV PW:         1.10 cm      LV E/e' medial:  4.9 LV IVS:        1.10 cm      LV e' lateral:   6.53 cm/s LVOT diam:     2.10 cm      LV E/e' lateral: 7.4 LV SV:         70 LV SV Index:   39 LVOT Area:     3.46 cm  LV Volumes (MOD) LV vol d, MOD A2C: 42.7 ml LV vol d, MOD A4C: 107.0 ml LV vol s, MOD A2C: 16.0 ml LV vol s, MOD A4C: 42.8 ml LV SV MOD A2C:     26.7 ml LV SV MOD A4C:     107.0 ml LV SV MOD BP:      42.4 ml RIGHT VENTRICLE RV Basal diam:  3.30 cm RV Mid diam:    2.90 cm RV S prime:     13.10 cm/s TAPSE (M-mode): 1.4 cm LEFT ATRIUM             Index        RIGHT ATRIUM           Index LA diam:        3.50 cm 1.97 cm/m   RA Area:     12.60 cm LA Vol (A2C):   26.7 ml 15.00 ml/m  RA Volume:   28.30 ml  15.90 ml/m LA Vol (A4C):   34.1 ml 19.16 ml/m LA Biplane Vol: 31.5 ml 17.70 ml/m  AORTIC VALVE                    PULMONIC VALVE AV Area (Vmax):    2.93 cm     PV Vmax:       1.02 m/s AV Area (Vmean):   2.78 cm     PV Peak grad:  4.2 mmHg AV Area (VTI):     2.71 cm AV Vmax:  132.00 cm/s AV Vmean:          90.600 cm/s AV VTI:            0.257 m AV Peak Grad:      7.0 mmHg AV Mean Grad:      4.0 mmHg LVOT Vmax:         111.50 cm/s LVOT Vmean:        72.600 cm/s LVOT VTI:          0.201 m LVOT/AV VTI ratio: 0.78  AORTA Ao Root diam: 3.60 cm MITRAL VALVE               TRICUSPID VALVE MV Area (PHT): 3.19 cm    TR Peak grad:   18.5 mmHg MV Decel Time: 238 msec    TR Vmax:        215.00 cm/s MR Peak grad: 24.5 mmHg MR Vmax:      247.33 cm/s  SHUNTS MV E velocity: 48.30 cm/s  Systemic VTI:  0.20 m MV A velocity: 85.10 cm/s  Systemic Diam: 2.10 cm MV E/A ratio:  0.57  Kathlyn Sacramento MD Electronically signed by Kathlyn Sacramento MD Signature Date/Time: 10/12/2022/1:32:19 PM    Final    MR BRAIN WO CONTRAST  Result Date: 10/11/2022 CLINICAL DATA:  Initial evaluation for neuro deficit, stroke suspected. EXAM: MRI HEAD WITHOUT CONTRAST TECHNIQUE: Multiplanar, multiecho pulse sequences of the brain and surrounding structures were obtained without intravenous contrast. COMPARISON:  Prior CT from earlier the same day as well as previous MRI from 09/08/2021. FINDINGS: Brain: Generalized age-related cerebral atrophy. Extensive patchy and confluent T2/FLAIR hyperintensity involving the periventricular and deep white matter both cerebral hemispheres, most consistent with chronic microvascular ischemic disease, advanced in nature. Patchy involvement of the deep gray nuclei and pons noted. Well layering degeneration about the medullary pyramids noted. 1 cm focus of restricted diffusion involving the posterior right basal ganglia, consistent with an acute ischemic infarct (series 5, image 29). No associated mass effect or definite hemorrhage. No other evidence for acute or subacute ischemia. No areas of chronic cortical infarction. No acute intracranial hemorrhage. Innumerable chronic micro hemorrhages again noted, most pronounced about the brainstem and deep gray nuclei. Overall pattern favored to reflect changes of chronic poorly controlled hypertension, although amyloid angiopathy remains a consideration. No mass lesion, midline shift or mass effect. No hydrocephalus or extra-axial fluid collection. Pituitary gland and suprasellar region within normal limits. Vascular: Major intracranial vascular flow voids are maintained. Skull and upper cervical spine: Craniocervical junction within normal limits. Bone marrow signal intensity normal. No scalp soft tissue abnormality. Sinuses/Orbits: Prior bilateral ocular lens replacement. Mild scattered mucosal thickening noted about the ethmoidal air  cells and maxillary sinuses. No mastoid effusion. Other: None. IMPRESSION: 1. 1 cm acute ischemic nonhemorrhagic posterior right basal ganglia infarct. 2. Underlying age-related cerebral atrophy with advanced chronic microvascular ischemic disease. 3. Innumerable chronic micro hemorrhages, most pronounced about the brainstem and deep gray nuclei. Overall pattern favored to reflect changes of chronic poorly controlled hypertension, although amyloid angiopathy remains a consideration. Electronically Signed   By: Jeannine Boga M.D.   On: 10/11/2022 21:13   DG Chest Port 1 View  Result Date: 10/11/2022 CLINICAL DATA:  Dyspnea on exertion EXAM: PORTABLE CHEST 1 VIEW COMPARISON:  12/08/2021 FINDINGS: The heart size and mediastinal contours are within normal limits. Both lungs are clear. The visualized skeletal structures are unremarkable. IMPRESSION: No active disease. Electronically Signed   By: Madie Reno.D.  On: 10/11/2022 19:00   CT HEAD WO CONTRAST  Result Date: 10/11/2022 CLINICAL DATA:  Neuro deficit, acute, stroke suspected EXAM: CT HEAD WITHOUT CONTRAST TECHNIQUE: Contiguous axial images were obtained from the base of the skull through the vertex without intravenous contrast. RADIATION DOSE REDUCTION: This exam was performed according to the departmental dose-optimization program which includes automated exposure control, adjustment of the mA and/or kV according to patient size and/or use of iterative reconstruction technique. COMPARISON:  09/13/2021 FINDINGS: Brain: No evidence of acute infarction, hemorrhage, hydrocephalus, extra-axial collection or mass lesion/mass effect. Extensive low-density changes within the periventricular and subcortical white matter compatible with chronic microvascular ischemic change. Mild diffuse cerebral volume loss. Vascular: Atherosclerotic calcifications involving the large vessels of the skull base. No unexpected hyperdense vessel. Skull: Normal. Negative  for fracture or focal lesion. Sinuses/Orbits: No acute finding. Other: None. IMPRESSION: 1. No acute intracranial findings. 2. Advanced chronic microvascular ischemic change and mild cerebral volume loss. Electronically Signed   By: Davina Poke D.O.   On: 10/11/2022 12:59    Microbiology: Recent Results (from the past 240 hour(s))  Resp panel by RT-PCR (RSV, Flu A&B, Covid) Anterior Nasal Swab     Status: None   Collection Time: 10/18/22  9:59 AM   Specimen: Anterior Nasal Swab  Result Value Ref Range Status   SARS Coronavirus 2 by RT PCR NEGATIVE NEGATIVE Final    Comment: (NOTE) SARS-CoV-2 target nucleic acids are NOT DETECTED.  The SARS-CoV-2 RNA is generally detectable in upper respiratory specimens during the acute phase of infection. The lowest concentration of SARS-CoV-2 viral copies this assay can detect is 138 copies/mL. A negative result does not preclude SARS-Cov-2 infection and should not be used as the sole basis for treatment or other patient management decisions. A negative result may occur with  improper specimen collection/handling, submission of specimen other than nasopharyngeal swab, presence of viral mutation(s) within the areas targeted by this assay, and inadequate number of viral copies(<138 copies/mL). A negative result must be combined with clinical observations, patient history, and epidemiological information. The expected result is Negative.  Fact Sheet for Patients:  EntrepreneurPulse.com.au  Fact Sheet for Healthcare Providers:  IncredibleEmployment.be  This test is no t yet approved or cleared by the [place] FDA and  has been authorized for detection and/or diagnosis of SARS-CoV-2 by FDA under an Emergency Use Authorization (EUA). This EUA will remain  in effect (meaning this test can be used) for the duration of the COVID-19 declaration under Section 564(b)(1) of the Act, 21 U.S.C.section  360bbb-3(b)(1), unless the authorization is terminated  or revoked sooner.       Influenza A by PCR NEGATIVE NEGATIVE Final   Influenza B by PCR NEGATIVE NEGATIVE Final    Comment: (NOTE) The Xpert Xpress SARS-CoV-2/FLU/RSV plus assay is intended as an aid in the diagnosis of influenza from Nasopharyngeal swab specimens and should not be used as a sole basis for treatment. Nasal washings and aspirates are unacceptable for Xpert Xpress SARS-CoV-2/FLU/RSV testing.  Fact Sheet for Patients: EntrepreneurPulse.com.au  Fact Sheet for Healthcare Providers: IncredibleEmployment.be  This test is not yet approved or cleared by the [place] FDA and has been authorized for detection and/or diagnosis of SARS-CoV-2 by FDA under an Emergency Use Authorization (EUA). This EUA will remain in effect (meaning this test can be used) for the duration of the COVID-19 declaration under Section 564(b)(1) of the Act, 21 U.S.C. section 360bbb-3(b)(1), unless the authorization is terminated or revoked.  Resp Syncytial Virus by PCR NEGATIVE NEGATIVE Final    Comment: (NOTE) Fact Sheet for Patients: EntrepreneurPulse.com.au  Fact Sheet for Healthcare Providers: IncredibleEmployment.be  This test is not yet approved or cleared by the [place] FDA and has been authorized for detection and/or diagnosis of SARS-CoV-2 by FDA under an Emergency Use Authorization (EUA). This EUA will remain in effect (meaning this test can be used) for the duration of the COVID-19 declaration under Section 564(b)(1) of the Act, 21 U.S.C. section 360bbb-3(b)(1), unless the authorization is terminated or revoked.  Performed at [facility], [address], [place], [place] [address]   Urine Culture     Status: Abnormal   Collection Time: 10/18/22 11:06 AM   Specimen: Urine, Clean Catch  Result Value Ref Range Status   Specimen  Description   Final    URINE, CLEAN CATCH Performed at [facility], [address], [place], [place] [address]    Special Requests   Final    NONE Reflexed from [id number] Performed at [facility], [place], [place] [address]    Culture 50,000 COLONIES/mL PROTEUS MIRABILIS (A)  Final   Report Status 10/21/2022 FINAL  Final   Organism ID, Bacteria PROTEUS MIRABILIS (A)  Final      Susceptibility   Proteus mirabilis - MIC*    AMPICILLIN <=2 SENSITIVE Sensitive     CEFAZOLIN 8 SENSITIVE Sensitive     CEFEPIME <=0.12 SENSITIVE Sensitive     CEFTRIAXONE <=0.25 SENSITIVE Sensitive     CIPROFLOXACIN <=0.25 SENSITIVE Sensitive     GENTAMICIN <=1 SENSITIVE Sensitive     IMIPENEM 2 SENSITIVE Sensitive     NITROFURANTOIN 128 RESISTANT Resistant     TRIMETH/SULFA <=20 SENSITIVE Sensitive     AMPICILLIN/SULBACTAM <=2 SENSITIVE Sensitive     PIP/TAZO <=4 SENSITIVE Sensitive     * 50,000 COLONIES/mL PROTEUS MIRABILIS     Labs: Basic Metabolic Panel: Recent Labs  Lab 10/21/22 0604 10/22/22 0427 10/23/22 0447 10/24/22 0541 10/25/22 0358  NA 139 141 139 139 140  K 4.0 3.9 3.9 4.0 4.0  CL 110 112* 111 113* 112*  CO2 23 21* 23 21* 18*  GLUCOSE 190* 130* 244* 178* 183*  BUN 78* 76* 72* 73* 69*  CREATININE 3.99* 3.83* 3.65* 3.55* 3.45*  CALCIUM 7.5* 7.9* 8.0* 8.2* 8.4*   Liver Function Tests: No results for input(s): "AST", "ALT", "ALKPHOS", "BILITOT", "PROT", "ALBUMIN" in the last 168 hours. No results for input(s): "LIPASE", "AMYLASE" in the last 168 hours. No results for input(s): "AMMONIA" in the last 168 hours. CBC: Recent Labs  Lab 10/20/22 0353 10/21/22 0604 10/22/22 0427 10/23/22 0447  WBC 8.6 8.2 8.7 9.5  HGB 8.7* 8.1* 7.9* 7.7*  HCT 26.5* 24.0* 23.9* 23.4*  MCV 90.1 89.6 90.2 89.7  PLT 151 142* 141* 140*   Cardiac Enzymes: No results for input(s): "CKTOTAL", "CKMB", "CKMBINDEX", "TROPONINI" in the last 168 hours. BNP: BNP (last 3  results) Recent Labs    10/18/22 0954  BNP 37.6    ProBNP (last 3 results) No results for input(s): "PROBNP" in the last 8760 hours.  CBG: Recent Labs  Lab 10/25/22 0800 10/25/22 1210 10/25/22 1639 10/25/22 2238 10/26/22 0744  GLUCAP 119* 134* 197* 149* 115*       Signed:  Desma Maxim MD.  [facility] 10/26/2022, 9:37 AM

## 2022-10-25 NOTE — TOC Progression Note (Signed)
Transition of Care [facility]) - Progression Note    Patient Details  Name: Alicia Garner MRN: [id number] Date of Birth: Oct 06, 1940  Transition of Care [facility]) CM/SW [place], RN Phone Number: 10/25/2022, 10:19 AM  Clinical Narrative:    The patient is medically ready to DC to [facility], Ins expired, restarting Ins auth to be able to DC   Expected Discharge Plan: [place] Barriers to Discharge: Insurance Authorization  Expected Discharge Plan and Services                                               Social Determinants of Health (SDOH) Interventions SDOH Screenings   Food Insecurity: No Food Insecurity (10/18/2022)  Housing: Low Risk  (10/18/2022)  Transportation Needs: No Transportation Needs (10/18/2022)  Utilities: Not At Risk (10/18/2022)  Tobacco Use: Low Risk  (10/18/2022)    Readmission Risk Interventions    12/05/2021    3:14 PM  Readmission Risk Prevention Plan  Transportation Screening Complete  PCP or Specialist Appt within 5-7 Days Complete  Home Care Screening Complete  Medication Review (RN CM) Complete

## 2022-10-25 NOTE — TOC Progression Note (Addendum)
Transition of Care [facility]) - Progression Note    Patient Details  Name: Alicia Garner MRN: [id number] Date of Birth: 03-Oct-1940  Transition of Care [facility]) CM/SW [place], RN Phone Number: 10/25/2022, 1:23 PM  Clinical Narrative Ins approved to go to [facility] 2/4-2/7 ref [id number] plan auth id [id number]:     Going to room 16B, [name] the daughter  Is aware EMS called to transport  Expected Discharge Plan: [place] Barriers to Discharge: Insurance Authorization  Expected Discharge Plan and Services         Expected Discharge Date: 10/25/22                                     Social Determinants of Health (SDOH) Interventions SDOH Screenings   Food Insecurity: No Food Insecurity (10/18/2022)  Housing: Low Risk  (10/18/2022)  Transportation Needs: No Transportation Needs (10/18/2022)  Utilities: Not At Risk (10/18/2022)  Tobacco Use: Low Risk  (10/18/2022)    Readmission Risk Interventions    12/05/2021    3:14 PM  Readmission Risk Prevention Plan  Transportation Screening Complete  PCP or Specialist Appt within 5-7 Days Complete  Home Care Screening Complete  Medication Review (RN CM) Complete

## 2022-10-25 NOTE — TOC Progression Note (Signed)
Transition of Care [facility]) - Progression Note    Patient Details  Name: Kameshia Madruga MRN: [id number] Date of Birth: 14-Jun-1941  Transition of Care [facility]) CM/SW [place], RN Phone Number: 10/25/2022, 12:38 PM  Clinical Narrative:     [facility], I notified the patient and her daughter [name] in the room ,they accepted, Ins auth needs switched  Expected Discharge Plan: [place] Barriers to Discharge: Insurance Authorization  Expected Discharge Plan and Services         Expected Discharge Date: 10/25/22                                     Social Determinants of Health ([place]) Interventions SDOH Screenings   Food Insecurity: No Food Insecurity (10/18/2022)  Housing: Low Risk  (10/18/2022)  Transportation Needs: No Transportation Needs (10/18/2022)  Utilities: Not At Risk (10/18/2022)  Tobacco Use: Low Risk  (10/18/2022)    Readmission Risk Interventions    12/05/2021    3:14 PM  Readmission Risk Prevention Plan  Transportation Screening Complete  PCP or Specialist Appt within 5-7 Days Complete  Home Care Screening Complete  Medication Review (RN CM) Complete

## 2022-10-25 NOTE — Progress Notes (Signed)
                                                     Palliative Care Progress Note   Patient Name: Alicia Garner       Date: 10/25/2022 DOB: 01/30/1941  Age: 82 y.o. MRN#: [id number] Attending Physician: Gwynne Edinger, MD Primary Care Physician: Clinic, [facility] Admit Date: 10/18/2022  Chart reviewed.  No acute palliative needs today.  Plan remains to discharge to SNF with nephrology follow-up as an outpatient.  TOC following closely.  PMT will continue to follow and monitor the patient peripherally throughout her hospitalization.   Alba Ilsa Iha, FNP-BC Palliative Medicine Team Team Phone # [phone]  NO CHARGE

## 2022-10-25 NOTE — Care Management Important Message (Signed)
Important Message  Patient Details  Name: Alicia Garner MRN: [id number] Date of Birth: 22-Jul-1941   Medicare Important Message Given:  Yes  Reviewed Medicare IM with Rica Mote, daughter, at [phone].  Confirmed a copy of the Medicare IM still available for reference.     Dannette Barbara 10/25/2022, 12:30 PM

## 2022-10-25 NOTE — Progress Notes (Signed)
Progress Note   Patient: Alicia Garner WNU:[id number] DOB: 1941/04/07 DOA: 10/18/2022     6 DOS: the patient was seen and examined on 10/25/2022   Brief hospital course: Taken from H&P.    Alicia Garner is a 82 y.o. female with medical history significant of recent admission due to stroke with left-sided weakness, hypertension, hyperlipidemia, diabetes mellitus, CKD stage IV, anemia, who presents with generalized weakness, bilateral foot pain.   Patient was recently hospitalized due to stroke from 1/22 to 1/24.  Patient has left sided weakness and difficulty walking after stroke. Patient came back to the emergency room on 1/25 after being released on 1/24 where she was complained of decreased p.o. intake.  Patient was given 1 L of fluids and had improvement of her blood pressure and so she was discharged.   Today, pt comes back due to generalized weakness in the past 3 days, poor appetite and decreased oral intake.    Data reviewed independently and ED Course: pt was found to have WBC 8.3, lactic acid 12, negative PCR for COVID and flu.  Worsening renal function with creatinine 3.29, BUN 43, GFR 14 (recent baseline creatinine 2.43)., Oxygen saturation 96% on room air, temperature 99.6, blood pressure 115/70, heart rate 92, RR 15.  CT of head is negative for acute abnormality, but showed previous infarction . EKG: Sinus rhythm, QTc 466, T wave inversion only in lead III, LAD, poor R wave progression, LVH.    1/30: Vital stable.  Renal function continued to get worse despite getting some IV fluid.  Non anion gap metabolic acidosis with bicarb of 18 secondary to AKI and CKD.  Uric acid mildly elevated at 7.2.  BNP normal at 37.6.  Urinary output of 800 recorded.  Nephrology was consulted. PT is recommending SNF.   Assessment and Plan: * Acute renal failure superimposed on stage 4 chronic kidney disease ([name]) Metabolic acidosis Most likely prerenal due to dehydration and continue taking Cozaar.  Patient  was also using Voltaren gel very regularly.  Renal function appears to have reached nadir and has slowly improved last few days. Bicarb improved. Renal u/s no signs obstruction. Uop adequate Cozaar all and Voltaren gel was held -Nephrology following, no plans for dialysis, ok for discharge w/ nephro f/u 1-2 weeks - cont sodium bicarb tablets  -Monitor renal function -Avoid nephrotoxins - now off fluids  Gout Concern of gout flare in feet.  Mildly elevated uric acid.  Pain improved with steroid. Home colchicine was held due to worsening renal function.  Procalcitonin 0.16. Symptoms resolved and now s/p course of prednisone w/ taper.  CVA (cerebral vascular accident) [facility]) Recent diagnosis -Continue aspirin, Plavix and Crestor  Generalized weakness PT is recommending SNF, has a bed and insurance auth, per [facility] can't discharge this weekend, plan will be d/c Monday  Essential hypertension Blood pressure mild elevation today Holding home Cozaar due to worsening renal function. - cont amlodipine, will add atenolol  Chronic diastolic CHF (congestive heart failure) ([name]) No concern of exacerbation.  Clinically appears euvolemic and BNP normal at 37. 2D echo 10/12/2022 showed EF of 55-60 with grade 1 diastolic dysfunction.  -Monitor volume status closely  Type II diabetes mellitus with renal manifestations ([name]) Seems well-controlled with A1c of 6.1.  Patient was on Januvia at home. Glucose elevated today with steroids -SSI, will add long-acting  HLD (hyperlipidemia) -Continue Crestor  Normocytic anemia Hemoglobin decreased to 7s morning, no bleeding.  Worsening renal function likely the culprit.  All cell lines  decreased. -Monitor hemoglobin - may need esa, defer to nephrology -Transfuse if below 7  Constipation No bm noted for past 4 days, can't d/c to snf until has one - enema, lactulose   Subjective: Patient was seen and examined today.  Denies any pain.  Denies pain  tolerating diet.  Physical Exam: Vitals:   10/25/22 0500 10/25/22 0807 10/25/22 0949 10/25/22 1601  BP:  138/65  134/75  Pulse:  (!) 58 63 69  Resp:  16  16  Temp:  98.5 F (36.9 C)  98.9 F (37.2 C)  TempSrc:      SpO2:  100%  100%  Weight: 80.5 kg     Height:       General.  Chronically ill-appearing elderly lady, in no acute distress. Pulmonary.  Lungs clear bilaterally, normal respiratory effort. CV.  Regular rate and rhythm, no JVD, rub or murmur. Abdomen.  Soft, nontender, nondistended, BS positive. CNS.  Alert and oriented to self only.  No focal neurologic deficit. Extremities.  Trace LE edema, no cyanosis, pulses intact and symmetrical. Psychiatry.  Judgment and insight appears impaired.  Data Reviewed: Prior data reviewed  Family Communication: Discussed with daughter Alicia Garner @ bedside 2/5  Disposition: Status is: Inpatient Remains inpatient appropriate because: unsafe d/c plan  Planned Discharge Destination: Skilled nursing facility  DVT prophylaxis.  Subcu heparin Time spent: 30  Author: Desma Maxim, MD 10/25/2022 4:46 PM  For on call review [email or web address].

## 2022-10-26 DIAGNOSIS — E860 Dehydration: Secondary | ICD-10-CM | POA: Diagnosis not present

## 2022-10-26 DIAGNOSIS — I69920 Aphasia following unspecified cerebrovascular disease: Secondary | ICD-10-CM | POA: Diagnosis not present

## 2022-10-26 DIAGNOSIS — Z7401 Bed confinement status: Secondary | ICD-10-CM | POA: Diagnosis not present

## 2022-10-26 DIAGNOSIS — I10 Essential (primary) hypertension: Secondary | ICD-10-CM | POA: Diagnosis not present

## 2022-10-26 DIAGNOSIS — R5381 Other malaise: Secondary | ICD-10-CM | POA: Diagnosis not present

## 2022-10-26 DIAGNOSIS — E1122 Type 2 diabetes mellitus with diabetic chronic kidney disease: Secondary | ICD-10-CM | POA: Diagnosis not present

## 2022-10-26 DIAGNOSIS — R29898 Other symptoms and signs involving the musculoskeletal system: Secondary | ICD-10-CM | POA: Diagnosis not present

## 2022-10-26 DIAGNOSIS — I5032 Chronic diastolic (congestive) heart failure: Secondary | ICD-10-CM | POA: Diagnosis not present

## 2022-10-26 DIAGNOSIS — L309 Dermatitis, unspecified: Secondary | ICD-10-CM | POA: Diagnosis not present

## 2022-10-26 DIAGNOSIS — R41841 Cognitive communication deficit: Secondary | ICD-10-CM | POA: Diagnosis not present

## 2022-10-26 DIAGNOSIS — M2550 Pain in unspecified joint: Secondary | ICD-10-CM | POA: Diagnosis not present

## 2022-10-26 DIAGNOSIS — M6281 Muscle weakness (generalized): Secondary | ICD-10-CM | POA: Diagnosis not present

## 2022-10-26 DIAGNOSIS — E8809 Other disorders of plasma-protein metabolism, not elsewhere classified: Secondary | ICD-10-CM | POA: Diagnosis not present

## 2022-10-26 DIAGNOSIS — E785 Hyperlipidemia, unspecified: Secondary | ICD-10-CM | POA: Diagnosis not present

## 2022-10-26 DIAGNOSIS — N179 Acute kidney failure, unspecified: Secondary | ICD-10-CM | POA: Diagnosis not present

## 2022-10-26 DIAGNOSIS — D638 Anemia in other chronic diseases classified elsewhere: Secondary | ICD-10-CM | POA: Diagnosis not present

## 2022-10-26 DIAGNOSIS — K5900 Constipation, unspecified: Secondary | ICD-10-CM | POA: Diagnosis not present

## 2022-10-26 DIAGNOSIS — L22 Diaper dermatitis: Secondary | ICD-10-CM | POA: Diagnosis not present

## 2022-10-26 DIAGNOSIS — R278 Other lack of coordination: Secondary | ICD-10-CM | POA: Diagnosis not present

## 2022-10-26 DIAGNOSIS — M109 Gout, unspecified: Secondary | ICD-10-CM | POA: Diagnosis not present

## 2022-10-26 DIAGNOSIS — W19XXXA Unspecified fall, initial encounter: Secondary | ICD-10-CM | POA: Diagnosis not present

## 2022-10-26 DIAGNOSIS — I69354 Hemiplegia and hemiparesis following cerebral infarction affecting left non-dominant side: Secondary | ICD-10-CM | POA: Diagnosis not present

## 2022-10-26 DIAGNOSIS — E1169 Type 2 diabetes mellitus with other specified complication: Secondary | ICD-10-CM | POA: Diagnosis not present

## 2022-10-26 DIAGNOSIS — N184 Chronic kidney disease, stage 4 (severe): Secondary | ICD-10-CM | POA: Diagnosis not present

## 2022-10-26 DIAGNOSIS — R531 Weakness: Secondary | ICD-10-CM | POA: Diagnosis not present

## 2022-10-26 DIAGNOSIS — M79671 Pain in right foot: Secondary | ICD-10-CM | POA: Diagnosis not present

## 2022-10-26 DIAGNOSIS — N189 Chronic kidney disease, unspecified: Secondary | ICD-10-CM | POA: Diagnosis not present

## 2022-10-26 DIAGNOSIS — E1129 Type 2 diabetes mellitus with other diabetic kidney complication: Secondary | ICD-10-CM | POA: Diagnosis not present

## 2022-10-26 LAB — GLUCOSE, CAPILLARY
Glucose-Capillary: 115 mg/dL — ABNORMAL HIGH (ref 70–99)
Glucose-Capillary: 150 mg/dL — ABNORMAL HIGH (ref 70–99)

## 2022-10-26 NOTE — TOC Progression Note (Signed)
Transition of Care [facility]) - Progression Note    Patient Details  Name: Alicia Garner MRN: [id number] Date of Birth: 01-29-1941  Transition of Care [facility]) CM/SW [place], RN Phone Number: 10/26/2022, 9:43 AM  Clinical Narrative:     Spoke with the daughter [name], she confirms that she is ready for the patient to DC to [facility] room 1B, I provided her the address to [facility],  Ems will transport  Expected Discharge Plan: [place] Barriers to Discharge: Insurance Authorization  Expected Discharge Plan and Services         Expected Discharge Date: 10/26/22                                     Social Determinants of Health (SDOH) Interventions SDOH Screenings   Food Insecurity: No Food Insecurity (10/18/2022)  Housing: Low Risk  (10/18/2022)  Transportation Needs: No Transportation Needs (10/18/2022)  Utilities: Not At Risk (10/18/2022)  Tobacco Use: Low Risk  (10/18/2022)    Readmission Risk Interventions    12/05/2021    3:14 PM  Readmission Risk Prevention Plan  Transportation Screening Complete  PCP or Specialist Appt within 5-7 Days Complete  Home Care Screening Complete  Medication Review (RN CM) Complete

## 2022-10-26 NOTE — Plan of Care (Signed)
Patient discharged per MD orders at this time.All dc instructions, education and medications reviewed with the patient.Pt expressed understanding and will comply with dc instructions.f/u appointments was also communicated to the patient.no verbal c/o or any ssx of distress.Pt was discharged to the [place] and rehab facility for PT/OT services per order.report was called to staff nurse Ms Delana Meyer before transport.Pt was transported by 2 [facility] personnel on a stretcher.

## 2022-10-26 NOTE — TOC Progression Note (Signed)
Transition of Care [facility]) - Progression Note    Patient Details  Name: Alicia Garner MRN: [id number] Date of Birth: 1940/12/05  Transition of Care [facility]) CM/SW [place], RN Phone Number: 10/26/2022, 11:05 AM  Clinical Narrative:    EMS called to transport to [facility] room 16B Report has been called by bedside nurse, daughter tameka is aware   Expected Discharge Plan: Skilled Nursing Facility Barriers to Discharge: Insurance Authorization  Expected Discharge Plan and Services         Expected Discharge Date: 10/26/22                                     Social Determinants of Health ([place]) Interventions SDOH Screenings   Food Insecurity: No Food Insecurity (10/18/2022)  Housing: Low Risk  (10/18/2022)  Transportation Needs: No Transportation Needs (10/18/2022)  Utilities: Not At Risk (10/18/2022)  Tobacco Use: Low Risk  (10/18/2022)    Readmission Risk Interventions    12/05/2021    3:14 PM  Readmission Risk Prevention Plan  Transportation Screening Complete  PCP or Specialist Appt within 5-7 Days Complete  Home Care Screening Complete  Medication Review (RN CM) Complete

## 2022-10-27 DIAGNOSIS — K5900 Constipation, unspecified: Secondary | ICD-10-CM | POA: Diagnosis not present

## 2022-10-27 DIAGNOSIS — R531 Weakness: Secondary | ICD-10-CM | POA: Diagnosis not present

## 2022-10-27 DIAGNOSIS — M109 Gout, unspecified: Secondary | ICD-10-CM | POA: Diagnosis not present

## 2022-10-27 DIAGNOSIS — E1129 Type 2 diabetes mellitus with other diabetic kidney complication: Secondary | ICD-10-CM | POA: Diagnosis not present

## 2022-10-27 DIAGNOSIS — I5032 Chronic diastolic (congestive) heart failure: Secondary | ICD-10-CM | POA: Diagnosis not present

## 2022-10-27 DIAGNOSIS — I69354 Hemiplegia and hemiparesis following cerebral infarction affecting left non-dominant side: Secondary | ICD-10-CM | POA: Diagnosis not present

## 2022-10-27 DIAGNOSIS — M2550 Pain in unspecified joint: Secondary | ICD-10-CM | POA: Diagnosis not present

## 2022-10-27 DIAGNOSIS — R5381 Other malaise: Secondary | ICD-10-CM | POA: Diagnosis not present

## 2022-10-27 DIAGNOSIS — E785 Hyperlipidemia, unspecified: Secondary | ICD-10-CM | POA: Diagnosis not present

## 2022-10-27 DIAGNOSIS — M79671 Pain in right foot: Secondary | ICD-10-CM | POA: Diagnosis not present

## 2022-10-29 DIAGNOSIS — E860 Dehydration: Secondary | ICD-10-CM | POA: Diagnosis not present

## 2022-10-29 DIAGNOSIS — E8809 Other disorders of plasma-protein metabolism, not elsewhere classified: Secondary | ICD-10-CM | POA: Diagnosis not present

## 2022-11-01 DIAGNOSIS — M2550 Pain in unspecified joint: Secondary | ICD-10-CM | POA: Diagnosis not present

## 2022-11-01 DIAGNOSIS — R531 Weakness: Secondary | ICD-10-CM | POA: Diagnosis not present

## 2022-11-01 DIAGNOSIS — E1129 Type 2 diabetes mellitus with other diabetic kidney complication: Secondary | ICD-10-CM | POA: Diagnosis not present

## 2022-11-01 DIAGNOSIS — E1169 Type 2 diabetes mellitus with other specified complication: Secondary | ICD-10-CM | POA: Diagnosis not present

## 2022-11-02 DIAGNOSIS — D638 Anemia in other chronic diseases classified elsewhere: Secondary | ICD-10-CM | POA: Diagnosis not present

## 2022-11-02 DIAGNOSIS — L22 Diaper dermatitis: Secondary | ICD-10-CM | POA: Diagnosis not present

## 2022-11-02 DIAGNOSIS — E1122 Type 2 diabetes mellitus with diabetic chronic kidney disease: Secondary | ICD-10-CM | POA: Diagnosis not present

## 2022-11-02 DIAGNOSIS — E1129 Type 2 diabetes mellitus with other diabetic kidney complication: Secondary | ICD-10-CM | POA: Diagnosis not present

## 2022-11-02 DIAGNOSIS — N179 Acute kidney failure, unspecified: Secondary | ICD-10-CM | POA: Diagnosis not present

## 2022-11-02 DIAGNOSIS — N189 Chronic kidney disease, unspecified: Secondary | ICD-10-CM | POA: Diagnosis not present

## 2022-11-02 DIAGNOSIS — E1169 Type 2 diabetes mellitus with other specified complication: Secondary | ICD-10-CM | POA: Diagnosis not present

## 2022-11-02 DIAGNOSIS — N184 Chronic kidney disease, stage 4 (severe): Secondary | ICD-10-CM | POA: Diagnosis not present

## 2022-11-03 DIAGNOSIS — L309 Dermatitis, unspecified: Secondary | ICD-10-CM | POA: Diagnosis not present

## 2022-11-05 DIAGNOSIS — M2550 Pain in unspecified joint: Secondary | ICD-10-CM | POA: Diagnosis not present

## 2022-11-05 DIAGNOSIS — R531 Weakness: Secondary | ICD-10-CM | POA: Diagnosis not present

## 2022-11-05 DIAGNOSIS — E785 Hyperlipidemia, unspecified: Secondary | ICD-10-CM | POA: Diagnosis not present

## 2022-11-10 DIAGNOSIS — I680 Cerebral amyloid angiopathy: Secondary | ICD-10-CM | POA: Diagnosis not present

## 2022-11-10 DIAGNOSIS — N184 Chronic kidney disease, stage 4 (severe): Secondary | ICD-10-CM | POA: Diagnosis not present

## 2022-11-10 DIAGNOSIS — E854 Organ-limited amyloidosis: Secondary | ICD-10-CM | POA: Diagnosis not present

## 2022-12-01 DIAGNOSIS — E1122 Type 2 diabetes mellitus with diabetic chronic kidney disease: Secondary | ICD-10-CM | POA: Diagnosis not present

## 2022-12-01 DIAGNOSIS — N189 Chronic kidney disease, unspecified: Secondary | ICD-10-CM | POA: Diagnosis not present

## 2022-12-01 DIAGNOSIS — D631 Anemia in chronic kidney disease: Secondary | ICD-10-CM | POA: Diagnosis not present

## 2022-12-01 DIAGNOSIS — I129 Hypertensive chronic kidney disease with stage 1 through stage 4 chronic kidney disease, or unspecified chronic kidney disease: Secondary | ICD-10-CM | POA: Diagnosis not present

## 2022-12-01 DIAGNOSIS — N184 Chronic kidney disease, stage 4 (severe): Secondary | ICD-10-CM | POA: Diagnosis not present

## 2022-12-01 DIAGNOSIS — N2581 Secondary hyperparathyroidism of renal origin: Secondary | ICD-10-CM | POA: Diagnosis not present

## 2022-12-15 ENCOUNTER — Other Ambulatory Visit (INDEPENDENT_AMBULATORY_CARE_PROVIDER_SITE_OTHER): Payer: Self-pay | Admitting: Nurse Practitioner

## 2022-12-15 DIAGNOSIS — N186 End stage renal disease: Secondary | ICD-10-CM

## 2022-12-24 ENCOUNTER — Encounter (INDEPENDENT_AMBULATORY_CARE_PROVIDER_SITE_OTHER): Payer: Medicare HMO | Admitting: Vascular Surgery

## 2022-12-24 ENCOUNTER — Other Ambulatory Visit (INDEPENDENT_AMBULATORY_CARE_PROVIDER_SITE_OTHER): Payer: Medicare HMO

## 2022-12-24 ENCOUNTER — Encounter (INDEPENDENT_AMBULATORY_CARE_PROVIDER_SITE_OTHER): Payer: Medicare HMO

## 2023-02-11 ENCOUNTER — Encounter (INDEPENDENT_AMBULATORY_CARE_PROVIDER_SITE_OTHER): Payer: Medicare HMO | Admitting: Vascular Surgery

## 2023-02-11 ENCOUNTER — Other Ambulatory Visit (INDEPENDENT_AMBULATORY_CARE_PROVIDER_SITE_OTHER): Payer: Medicare HMO

## 2023-02-11 ENCOUNTER — Encounter (INDEPENDENT_AMBULATORY_CARE_PROVIDER_SITE_OTHER): Payer: Medicare HMO

## 2023-02-21 DIAGNOSIS — K429 Umbilical hernia without obstruction or gangrene: Secondary | ICD-10-CM | POA: Diagnosis not present

## 2023-02-21 DIAGNOSIS — I129 Hypertensive chronic kidney disease with stage 1 through stage 4 chronic kidney disease, or unspecified chronic kidney disease: Secondary | ICD-10-CM | POA: Diagnosis not present

## 2023-02-21 DIAGNOSIS — D649 Anemia, unspecified: Secondary | ICD-10-CM | POA: Diagnosis not present

## 2023-02-21 DIAGNOSIS — D631 Anemia in chronic kidney disease: Secondary | ICD-10-CM | POA: Diagnosis not present

## 2023-02-21 DIAGNOSIS — E8722 Chronic metabolic acidosis: Secondary | ICD-10-CM | POA: Diagnosis not present

## 2023-02-21 DIAGNOSIS — Z7409 Other reduced mobility: Secondary | ICD-10-CM | POA: Diagnosis not present

## 2023-02-21 DIAGNOSIS — I130 Hypertensive heart and chronic kidney disease with heart failure and stage 1 through stage 4 chronic kidney disease, or unspecified chronic kidney disease: Secondary | ICD-10-CM | POA: Diagnosis not present

## 2023-02-21 DIAGNOSIS — N184 Chronic kidney disease, stage 4 (severe): Secondary | ICD-10-CM | POA: Diagnosis not present

## 2023-02-21 DIAGNOSIS — E872 Acidosis, unspecified: Secondary | ICD-10-CM | POA: Diagnosis not present

## 2023-02-21 DIAGNOSIS — F0390 Unspecified dementia without behavioral disturbance: Secondary | ICD-10-CM | POA: Diagnosis not present

## 2023-02-21 DIAGNOSIS — Z6832 Body mass index (BMI) 32.0-32.9, adult: Secondary | ICD-10-CM | POA: Diagnosis not present

## 2023-02-21 DIAGNOSIS — R7402 Elevation of levels of lactic acid dehydrogenase (LDH): Secondary | ICD-10-CM | POA: Diagnosis not present

## 2023-02-21 DIAGNOSIS — Z8673 Personal history of transient ischemic attack (TIA), and cerebral infarction without residual deficits: Secondary | ICD-10-CM | POA: Diagnosis not present

## 2023-02-21 DIAGNOSIS — I959 Hypotension, unspecified: Secondary | ICD-10-CM | POA: Diagnosis not present

## 2023-02-21 DIAGNOSIS — Z515 Encounter for palliative care: Secondary | ICD-10-CM | POA: Diagnosis not present

## 2023-02-21 DIAGNOSIS — I5032 Chronic diastolic (congestive) heart failure: Secondary | ICD-10-CM | POA: Diagnosis not present

## 2023-02-21 DIAGNOSIS — N3289 Other specified disorders of bladder: Secondary | ICD-10-CM | POA: Diagnosis not present

## 2023-02-21 DIAGNOSIS — R630 Anorexia: Secondary | ICD-10-CM | POA: Diagnosis not present

## 2023-02-21 DIAGNOSIS — E119 Type 2 diabetes mellitus without complications: Secondary | ICD-10-CM | POA: Diagnosis not present

## 2023-02-21 DIAGNOSIS — N179 Acute kidney failure, unspecified: Secondary | ICD-10-CM | POA: Diagnosis not present

## 2023-02-21 DIAGNOSIS — R4182 Altered mental status, unspecified: Secondary | ICD-10-CM | POA: Diagnosis not present

## 2023-02-21 DIAGNOSIS — R638 Other symptoms and signs concerning food and fluid intake: Secondary | ICD-10-CM | POA: Diagnosis not present

## 2023-02-21 DIAGNOSIS — E871 Hypo-osmolality and hyponatremia: Secondary | ICD-10-CM | POA: Diagnosis not present

## 2023-02-21 DIAGNOSIS — E43 Unspecified severe protein-calorie malnutrition: Secondary | ICD-10-CM | POA: Diagnosis not present

## 2023-02-21 DIAGNOSIS — R748 Abnormal levels of other serum enzymes: Secondary | ICD-10-CM | POA: Diagnosis not present

## 2023-02-21 DIAGNOSIS — R4189 Other symptoms and signs involving cognitive functions and awareness: Secondary | ICD-10-CM | POA: Diagnosis not present

## 2023-02-21 DIAGNOSIS — Z66 Do not resuscitate: Secondary | ICD-10-CM | POA: Diagnosis not present

## 2023-02-21 DIAGNOSIS — E1122 Type 2 diabetes mellitus with diabetic chronic kidney disease: Secondary | ICD-10-CM | POA: Diagnosis not present

## 2023-02-22 DIAGNOSIS — R630 Anorexia: Secondary | ICD-10-CM | POA: Diagnosis not present

## 2023-02-22 DIAGNOSIS — I130 Hypertensive heart and chronic kidney disease with heart failure and stage 1 through stage 4 chronic kidney disease, or unspecified chronic kidney disease: Secondary | ICD-10-CM | POA: Diagnosis not present

## 2023-02-22 DIAGNOSIS — N179 Acute kidney failure, unspecified: Secondary | ICD-10-CM | POA: Diagnosis not present

## 2023-02-22 DIAGNOSIS — E43 Unspecified severe protein-calorie malnutrition: Secondary | ICD-10-CM | POA: Diagnosis not present

## 2023-02-22 DIAGNOSIS — N184 Chronic kidney disease, stage 4 (severe): Secondary | ICD-10-CM | POA: Diagnosis not present

## 2023-02-22 DIAGNOSIS — E1122 Type 2 diabetes mellitus with diabetic chronic kidney disease: Secondary | ICD-10-CM | POA: Diagnosis not present

## 2023-02-22 DIAGNOSIS — R748 Abnormal levels of other serum enzymes: Secondary | ICD-10-CM | POA: Diagnosis not present

## 2023-02-22 DIAGNOSIS — F0390 Unspecified dementia without behavioral disturbance: Secondary | ICD-10-CM | POA: Diagnosis not present

## 2023-02-22 DIAGNOSIS — R7402 Elevation of levels of lactic acid dehydrogenase (LDH): Secondary | ICD-10-CM | POA: Diagnosis not present

## 2023-02-22 DIAGNOSIS — I5032 Chronic diastolic (congestive) heart failure: Secondary | ICD-10-CM | POA: Diagnosis not present

## 2023-02-23 DIAGNOSIS — R630 Anorexia: Secondary | ICD-10-CM | POA: Diagnosis not present

## 2023-02-24 DIAGNOSIS — R630 Anorexia: Secondary | ICD-10-CM | POA: Diagnosis not present

## 2023-03-28 ENCOUNTER — Encounter (INDEPENDENT_AMBULATORY_CARE_PROVIDER_SITE_OTHER): Payer: Medicare HMO

## 2023-03-28 ENCOUNTER — Other Ambulatory Visit (INDEPENDENT_AMBULATORY_CARE_PROVIDER_SITE_OTHER): Payer: Medicare HMO

## 2023-03-28 ENCOUNTER — Encounter (INDEPENDENT_AMBULATORY_CARE_PROVIDER_SITE_OTHER): Payer: Medicare HMO | Admitting: Nurse Practitioner

## 2023-06-11 ENCOUNTER — Inpatient Hospital Stay
Admission: EM | Admit: 2023-06-11 | Discharge: 2023-06-14 | DRG: 065 | Disposition: A | Discharge status: Home / Self Care | Payer: Medicare Other | Attending: Osteopathic Medicine | Admitting: Osteopathic Medicine

## 2023-06-11 ENCOUNTER — Encounter: Payer: Self-pay | Admitting: Radiology

## 2023-06-11 ENCOUNTER — Observation Stay: Payer: Medicare Other

## 2023-06-11 ENCOUNTER — Emergency Department: Payer: Medicare Other

## 2023-06-11 DIAGNOSIS — E1122 Type 2 diabetes mellitus with diabetic chronic kidney disease: Secondary | ICD-10-CM

## 2023-06-11 DIAGNOSIS — I130 Hypertensive heart and chronic kidney disease with heart failure and stage 1 through stage 4 chronic kidney disease, or unspecified chronic kidney disease: Secondary | ICD-10-CM | POA: Diagnosis present

## 2023-06-11 DIAGNOSIS — Z7984 Long term (current) use of oral hypoglycemic drugs: Secondary | ICD-10-CM

## 2023-06-11 DIAGNOSIS — D631 Anemia in chronic kidney disease: Secondary | ICD-10-CM | POA: Diagnosis present

## 2023-06-11 DIAGNOSIS — I10 Essential (primary) hypertension: Secondary | ICD-10-CM

## 2023-06-11 DIAGNOSIS — R627 Adult failure to thrive: Secondary | ICD-10-CM | POA: Diagnosis present

## 2023-06-11 DIAGNOSIS — I639 Cerebral infarction, unspecified: Secondary | ICD-10-CM | POA: Diagnosis not present

## 2023-06-11 DIAGNOSIS — N184 Chronic kidney disease, stage 4 (severe): Secondary | ICD-10-CM

## 2023-06-11 DIAGNOSIS — R29706 NIHSS score 6: Secondary | ICD-10-CM | POA: Diagnosis present

## 2023-06-11 DIAGNOSIS — I63531 Cerebral infarction due to unspecified occlusion or stenosis of right posterior cerebral artery: Secondary | ICD-10-CM | POA: Diagnosis not present

## 2023-06-11 DIAGNOSIS — R471 Dysarthria and anarthria: Principal | ICD-10-CM

## 2023-06-11 DIAGNOSIS — Z794 Long term (current) use of insulin: Secondary | ICD-10-CM

## 2023-06-11 DIAGNOSIS — Z833 Family history of diabetes mellitus: Secondary | ICD-10-CM

## 2023-06-11 DIAGNOSIS — Z7982 Long term (current) use of aspirin: Secondary | ICD-10-CM

## 2023-06-11 DIAGNOSIS — F0390 Unspecified dementia without behavioral disturbance: Secondary | ICD-10-CM | POA: Diagnosis present

## 2023-06-11 DIAGNOSIS — Z79899 Other long term (current) drug therapy: Secondary | ICD-10-CM

## 2023-06-11 DIAGNOSIS — R569 Unspecified convulsions: Secondary | ICD-10-CM | POA: Diagnosis not present

## 2023-06-11 DIAGNOSIS — I5032 Chronic diastolic (congestive) heart failure: Secondary | ICD-10-CM | POA: Diagnosis present

## 2023-06-11 DIAGNOSIS — Z7401 Bed confinement status: Secondary | ICD-10-CM

## 2023-06-11 DIAGNOSIS — I69354 Hemiplegia and hemiparesis following cerebral infarction affecting left non-dominant side: Secondary | ICD-10-CM

## 2023-06-11 DIAGNOSIS — R531 Weakness: Secondary | ICD-10-CM

## 2023-06-11 DIAGNOSIS — G8194 Hemiplegia, unspecified affecting left nondominant side: Secondary | ICD-10-CM | POA: Diagnosis present

## 2023-06-11 DIAGNOSIS — Z7902 Long term (current) use of antithrombotics/antiplatelets: Secondary | ICD-10-CM

## 2023-06-11 DIAGNOSIS — E785 Hyperlipidemia, unspecified: Secondary | ICD-10-CM

## 2023-06-11 DIAGNOSIS — Z66 Do not resuscitate: Secondary | ICD-10-CM | POA: Diagnosis present

## 2023-06-11 DIAGNOSIS — I6782 Cerebral ischemia: Secondary | ICD-10-CM | POA: Diagnosis present

## 2023-06-11 DIAGNOSIS — R4781 Slurred speech: Secondary | ICD-10-CM | POA: Diagnosis present

## 2023-06-11 DIAGNOSIS — Z8673 Personal history of transient ischemic attack (TIA), and cerebral infarction without residual deficits: Secondary | ICD-10-CM

## 2023-06-11 DIAGNOSIS — R112 Nausea with vomiting, unspecified: Secondary | ICD-10-CM | POA: Diagnosis present

## 2023-06-11 DIAGNOSIS — Z8249 Family history of ischemic heart disease and other diseases of the circulatory system: Secondary | ICD-10-CM

## 2023-06-11 LAB — DIFFERENTIAL
Abs Immature Granulocytes: 0.01 10*3/uL (ref 0.00–0.07)
Basophils Absolute: 0 10*3/uL (ref 0.0–0.1)
Basophils Relative: 0 %
Eosinophils Absolute: 0.1 10*3/uL (ref 0.0–0.5)
Eosinophils Relative: 1 %
Immature Granulocytes: 0 %
Lymphocytes Relative: 50 %
Lymphs Abs: 3.1 10*3/uL (ref 0.7–4.0)
Monocytes Absolute: 0.6 10*3/uL (ref 0.1–1.0)
Monocytes Relative: 9 %
Neutro Abs: 2.6 10*3/uL (ref 1.7–7.7)
Neutrophils Relative %: 40 %

## 2023-06-11 LAB — COMPREHENSIVE METABOLIC PANEL
ALT: 11 U/L (ref 0–44)
AST: 15 U/L (ref 15–41)
Albumin: 3.6 g/dL (ref 3.5–5.0)
Alkaline Phosphatase: 64 U/L (ref 38–126)
Anion gap: 11 (ref 5–15)
BUN: 41 mg/dL — ABNORMAL HIGH (ref 8–23)
CO2: 17 mmol/L — ABNORMAL LOW (ref 22–32)
Calcium: 8.8 mg/dL — ABNORMAL LOW (ref 8.9–10.3)
Chloride: 113 mmol/L — ABNORMAL HIGH (ref 98–111)
Creatinine, Ser: 2.52 mg/dL — ABNORMAL HIGH (ref 0.44–1.00)
GFR, Estimated: 19 mL/min — ABNORMAL LOW (ref >60)
Glucose, Bld: 191 mg/dL — ABNORMAL HIGH (ref 70–99)
Potassium: 4.5 mmol/L (ref 3.5–5.1)
Sodium: 141 mmol/L (ref 135–145)
Total Bilirubin: 0.7 mg/dL (ref 0.3–1.2)
Total Protein: 8 g/dL (ref 6.5–8.1)

## 2023-06-11 LAB — CBC
HCT: 32.5 % — ABNORMAL LOW (ref 36.0–46.0)
Hemoglobin: 10.2 g/dL — ABNORMAL LOW (ref 12.0–15.0)
MCH: 30.8 pg (ref 26.0–34.0)
MCHC: 31.4 g/dL (ref 30.0–36.0)
MCV: 98.2 fL (ref 80.0–100.0)
Platelets: 148 10*3/uL — ABNORMAL LOW (ref 150–400)
RBC: 3.31 MIL/uL — ABNORMAL LOW (ref 3.87–5.11)
RDW: 15.2 % (ref 11.5–15.5)
WBC: 6.3 10*3/uL (ref 4.0–10.5)
nRBC: 0 % (ref 0.0–0.2)

## 2023-06-11 LAB — APTT: aPTT: 28 seconds (ref 24–36)

## 2023-06-11 LAB — PROTIME-INR
INR: 1.1 (ref 0.8–1.2)
Prothrombin Time: 14.1 seconds (ref 11.4–15.2)

## 2023-06-11 LAB — TROPONIN I (HIGH SENSITIVITY): Troponin I (High Sensitivity): 5 ng/L (ref <18)

## 2023-06-11 LAB — ETHANOL: Alcohol, Ethyl (B): <10 mg/dL (ref <10)

## 2023-06-11 LAB — CBG MONITORING, ED: Glucose-Capillary: 169 mg/dL — ABNORMAL HIGH (ref 70–99)

## 2023-06-11 MED ORDER — ACETAMINOPHEN 325 MG PO TABS: 650.0000 mg | ORAL_TABLET | ORAL | Status: DC | PRN

## 2023-06-11 MED ORDER — SENNOSIDES-DOCUSATE SODIUM 8.6-50 MG PO TABS: 1.0000 | ORAL_TABLET | Freq: Every evening | ORAL | Status: DC | PRN

## 2023-06-11 MED ORDER — ASPIRIN 300 MG RE SUPP: 300.0000 mg | Freq: Every day | RECTAL | Status: DC

## 2023-06-11 MED ORDER — ACETAMINOPHEN 160 MG/5ML PO SOLN: 650.0000 mg | ORAL | Status: DC | PRN

## 2023-06-11 MED ORDER — CLOPIDOGREL BISULFATE 75 MG PO TABS
75.0000 mg | ORAL_TABLET | Freq: Every day | ORAL | Status: DC
  Administered 2023-06-12 – 2023-06-14 (×3): 75 mg via ORAL
  Filled 2023-06-11 (×3): qty 1

## 2023-06-11 MED ORDER — ENOXAPARIN SODIUM 40 MG/0.4ML IJ SOSY
40.0000 mg | PREFILLED_SYRINGE | INTRAMUSCULAR | Status: DC
  Administered 2023-06-11: 40 mg via SUBCUTANEOUS
  Filled 2023-06-11: qty 0.4

## 2023-06-11 MED ORDER — LINAGLIPTIN 5 MG PO TABS
5.0000 mg | ORAL_TABLET | Freq: Every day | ORAL | Status: DC
  Administered 2023-06-12 – 2023-06-13 (×2): 5 mg via ORAL
  Filled 2023-06-11 (×2): qty 1

## 2023-06-11 MED ORDER — SODIUM CHLORIDE 0.9% FLUSH: 3.0000 mL | Freq: Once | INTRAVENOUS | Status: DC

## 2023-06-11 MED ORDER — ACETAMINOPHEN 650 MG RE SUPP: 650.0000 mg | RECTAL | Status: DC | PRN

## 2023-06-11 MED ORDER — ROSUVASTATIN CALCIUM 20 MG PO TABS
20.0000 mg | ORAL_TABLET | Freq: Every day | ORAL | Status: DC
  Administered 2023-06-12 – 2023-06-14 (×3): 20 mg via ORAL
  Filled 2023-06-11 (×4): qty 1

## 2023-06-11 MED ORDER — LEVETIRACETAM IN NACL 1000 MG/100ML IV SOLN
1000.0000 mg | Freq: Once | INTRAVENOUS | Status: AC
  Administered 2023-06-11: 1000 mg via INTRAVENOUS
  Filled 2023-06-11: qty 100

## 2023-06-11 MED ORDER — SODIUM CHLORIDE 0.9 % IV SOLN
12.5000 mg | Freq: Four times a day (QID) | INTRAVENOUS | Status: DC | PRN
  Administered 2023-06-12: 12.5 mg via INTRAVENOUS
  Filled 2023-06-11: qty 12.5
  Filled 2023-06-11: qty 0.5

## 2023-06-11 MED ORDER — SODIUM CHLORIDE 0.9 % IV SOLN: INTRAVENOUS | Status: DC

## 2023-06-11 MED ORDER — ONDANSETRON HCL 4 MG/2ML IJ SOLN
4.0000 mg | Freq: Once | INTRAMUSCULAR | Status: AC
  Administered 2023-06-11: 4 mg via INTRAVENOUS
  Filled 2023-06-11: qty 2

## 2023-06-11 MED ORDER — ONDANSETRON HCL 4 MG/2ML IJ SOLN
4.0000 mg | INTRAMUSCULAR | Status: DC | PRN
  Administered 2023-06-12: 4 mg via INTRAVENOUS
  Filled 2023-06-11: qty 2

## 2023-06-11 MED ORDER — POLYETHYLENE GLYCOL 3350 17 G PO PACK: 17.0000 g | PACK | Freq: Every day | ORAL | Status: DC | PRN

## 2023-06-11 MED ORDER — VITAMIN B-12 1000 MCG PO TABS
1000.0000 ug | ORAL_TABLET | Freq: Every day | ORAL | Status: DC
  Administered 2023-06-12 – 2023-06-14 (×3): 1000 ug via ORAL
  Filled 2023-06-11 (×3): qty 1

## 2023-06-11 MED ORDER — AMLODIPINE BESYLATE 10 MG PO TABS
10.0000 mg | ORAL_TABLET | Freq: Every day | ORAL | Status: DC
  Administered 2023-06-12 – 2023-06-14 (×3): 10 mg via ORAL
  Filled 2023-06-11 (×3): qty 1

## 2023-06-11 MED ORDER — ASPIRIN 325 MG PO TABS: 325.0000 mg | ORAL_TABLET | Freq: Every day | ORAL | Status: DC

## 2023-06-11 MED ORDER — ASPIRIN 81 MG PO TBEC
81.0000 mg | DELAYED_RELEASE_TABLET | Freq: Every day | ORAL | Status: DC
  Administered 2023-06-12 – 2023-06-14 (×3): 81 mg via ORAL
  Filled 2023-06-11 (×3): qty 1

## 2023-06-11 MED ORDER — STROKE: EARLY STAGES OF RECOVERY BOOK: Freq: Once | Status: AC

## 2023-06-11 MED ORDER — TRAZODONE HCL 50 MG PO TABS: 25.0000 mg | ORAL_TABLET | Freq: Every evening | ORAL | Status: DC | PRN

## 2023-06-11 NOTE — ED Notes (Signed)
Patinet had N/V x 1 while in MRI

## 2023-06-11 NOTE — Consult Note (Signed)
[facility] Tele[facility]urology Consult Services   Patient Name:   Alicia Garner, Alicia Garner Date of Birth:   [birth year] Identification Number:   MRN - [id number] Date of Service:   06/11/2023 20:04:58  Diagnosis:       G40.9 - Epilepsy, unspecified  Impression:      The patient is a 82 year old woman with a history of hypertension, diabetes, hyperlipidemia, dementia, CVA with residual left sided weakness, CHF, ESRD, who presents with episode of stiffening and unresponsiveness, felt most likely to represent seizure. Recommend further evaluation with MRI brain with and without contrast and EEG. Start Keppra empirically at 1g IV and 500 mg BID maintenance.  Our recommendations are outlined below.  Recommendations:        Stroke/Telemetry Floor       Neuro Checks       Bedside Swallow Eval       DVT Prophylaxis       IV Fluids, Normal Saline       Head of Bed 30 Degrees       Euglycemia and Avoid Hyperthermia (PRN Acetaminophen)  Sign Out:       Discussed with Emergency Department Provider    ------------------------------------------------------------------------------  Advanced Imaging: Advanced Imaging Deferred because:  Non-disabling symptoms as verified by the patient; no cortical signs so not consistent with LVO   Metrics: Last Known Well: 06/11/2023 18:30:00 [facility] Notification Time: 06/11/2023 20:04:58 Arrival Time: 06/11/2023 19:57:00 Stamp Time: 06/11/2023 20:04:58 Initial Response Time: 06/11/2023 20:06:58 Symptoms: left sided weakness. Initial patient interaction: 06/11/2023 20:08:30 NIHSS Assessment Completed: 06/11/2023 20:16:06 Patient is not a candidate for Thrombolytic. Thrombolytic Medical Decision: 06/11/2023 20:16:07 Patient was not deemed candidate for Thrombolytic because of following reasons: Resolved symptoms (no residual disabling symptoms). Other diagnosis suspected.  CT head showed no acute hemorrhage or acute core infarct. I  personally Reviewed the CT Head and it Showed no acute abnormality  Primary Provider Notified of Diagnostic Impression and Management Plan on: 06/11/2023 20:27:06    ------------------------------------------------------------------------------  History of Present Illness: Patient is a 82 year old Female.  Patient was brought by EMS for symptoms of left sided weakness. Patient brought in my EMS with episode of stiffening and unresponsiveness. She took nap at 6:30 PM in her normal state of health. 5 minutes later she awoke with nausea/vomiting and choking sounds. She then developed stiffening in bilateral arms and grunting noises lasting 3-5 minutes. In ER she is awake but disoriented, follows commands. No prior history of seizure.   Past Medical History:      Hypertension      Diabetes Mellitus      Hyperlipidemia      Stroke Other PMH:  CHF, ESRD, dementia  Medications:  No Anticoagulant use  Antiplatelet use: Yes ASA Reviewed EMR for current medications  Allergies:  Reviewed Description: lisinopril  Social History: Smoking: No  Family History:  There is no family history of premature cerebrovascular disease pertinent to this consultation  ROS : 14 Points Review of Systems was performed and was negative except mentioned in HPI.  Past Surgical History: There Is No Surgical History Contributory To Today's Visit    Examination: BP(149/81), Pulse(90), 1A: Level of Consciousness - Alert; keenly responsive + 0 1B: Ask Month and Age - Could Not Answer Either Question Correctly + 2 1C: Blink Eyes & Squeeze Hands - Performs Both Tasks + 0 2: Test Horizontal Extraocular Movements - Normal + 0 3: Test Visual Fields - No Visual Loss + 0 4: Test Facial  Palsy (Use Grimace if Obtunded) - Normal symmetry + 0 5A: Test Left Arm Motor Drift - Drift, but doesn't hit bed + 1 5B: Test Right Arm Motor Drift - No Drift for 10 Seconds + 0 6A: Test Left Leg Motor Drift - Drift, but  doesn't hit bed + 1 6B: Test Right Leg Motor Drift - Drift, but doesn't hit bed + 1 7: Test Limb Ataxia (FNF/Heel-Shin) - No Ataxia + 0 8: Test Sensation - Normal; No sensory loss + 0 9: Test Language/Aphasia - Normal; No aphasia + 0 10: Test Dysarthria - Mild-Moderate Dysarthria: Slurring but can be understood + 1 11: Test Extinction/Inattention - No abnormality + 0  NIHSS Score: 6   Pre-Morbid Modified Rankin Scale: 4 Points = Moderately severe disability; unable to walk and attend to bodily needs without assistance  Spoke with : ED MD  This consult was conducted in real time using interactive audio and Immunologist. Patient was informed of the technology being used for this visit and agreed to proceed. Patient located in hospital and provider located at home/office setting.   Patient is being evaluated for possible acute neurologic impairment and high probability of imminent or life-threatening deterioration. I spent total of 30 minutes providing care to this patient, including time for face to face visit via telemedicine, review of medical records, imaging studies and discussion of findings with providers, the patient and/or family.   Dr Rosanne Ashing   [facility] For Inpatient follow-up with [facility] physician please call [facility] [phone]. This is not an outpatient service. Post hospital discharge, please contact hospital directly.  Please do not communicate with [facility] physicians via secure chat. If you have any questions, Please contact [facility]. Please call or reconsult our service if there are any clinical or diagnostic changes.

## 2023-06-11 NOTE — ED Notes (Signed)
MD Anner Crete with EMS upon arrival to clear for CT

## 2023-06-11 NOTE — ED Notes (Signed)
Code Stroke called to [facility] by  ems

## 2023-06-11 NOTE — ED Provider Notes (Signed)
[facility] Provider Note    Event Date/Time   First MD Initiated Contact with Patient 06/11/23 1958     (approximate)   History     HPI  Alicia Garner is a 82 y.o. female with a history of CVA with left-sided weakness, hypertension, hyperlipidemia, diabetes, CKD, and anemia who presents with acute onset of new left-sided weakness and slurred speech.  Her last known well was 6:35 PM.  Per EMS, she was witnessed to have increased left upper and lower extremity weakness compared to baseline, and also developed slurred speech.  The weakness has now returned to baseline, but the family still felt that the speech was slurred.  The patient is able to answer questions and denies any acute pain or other acute complaints at this time.  I reviewed the past medical records.  The patient was most recently mated to the hospitalist service in February with acute renal failure superimposed on CKD.   Physical Exam   Triage Vital Signs: ED Triage Vitals  Encounter Vitals Group     BP 06/11/23 2022 (!) 143/83     Systolic BP Percentile --      Diastolic BP Percentile --      Pulse Rate 06/11/23 2022 (!) 102     Resp 06/11/23 2022 18     Temp 06/11/23 2022 98.5 F (36.9 C)     Temp Source 06/11/23 2022 Oral     SpO2 06/11/23 2022 100 %     Weight 06/11/23 2049 161 lb 13.1 oz (73.4 kg)     Height 06/11/23 2055 5\' 4"  (1.626 m)     Head Circumference --      Peak Flow --      Pain Score 06/11/23 2028 0     Pain Loc --      Pain Education --      Exclude from Growth Chart --     Most recent vital signs: Vitals:   06/11/23 2030 06/11/23 2045  BP: (!) 148/99   Pulse: (!) 105 (!) 102  Resp: 15 18  Temp:    SpO2: 100% 100%     General: Alert, oriented x 1, no distress.  CV:  Good peripheral perfusion.  Resp:  Normal effort.  Abd:  No distention.  Other:  Dysarthric speech.  EOMI.  PERRLA.  No facial droop.  Mild left upper extremity weakness, otherwise motor  intact in all extremities.   ED Results / Procedures / Treatments   Labs (all labs ordered are listed, but only abnormal results are displayed) Labs Reviewed  CBC - Abnormal; Notable for the following components:      Result Value   RBC 3.31 (*)    Hemoglobin 10.2 (*)    HCT 32.5 (*)    Platelets 148 (*)    All other components within normal limits  COMPREHENSIVE METABOLIC PANEL - Abnormal; Notable for the following components:   Chloride 113 (*)    CO2 17 (*)    Glucose, Bld 191 (*)    BUN 41 (*)    Creatinine, Ser 2.52 (*)    Calcium 8.8 (*)    GFR, Estimated 19 (*)    All other components within normal limits  CBG MONITORING, ED - Abnormal; Notable for the following components:   Glucose-Capillary 169 (*)    All other components within normal limits  PROTIME-INR  APTT  DIFFERENTIAL  ETHANOL  TROPONIN I (HIGH SENSITIVITY)     EKG  ED ECG REPORT I, Dionne Bucy, the attending physician, personally viewed and interpreted this ECG.  Date: 06/11/2023 EKG Time: 2025 Rate: 103 Rhythm: Sinus tachycardia QRS Axis: Left axis Intervals: normal ST/T Wave abnormalities: Nonspecific ST abnormalities Narrative Interpretation: no evidence of acute ischemia    RADIOLOGY  CT head code stroke: I independently viewed and interpreted the images; there is no ICH.  Radiology report indicates remote Luking or infarcts with no acute findings.  PROCEDURES:  Critical Care performed: Yes  .Critical Care  Performed by: Dionne Bucy, MD Authorized by: Dionne Bucy, MD   Critical care provider statement:    Critical care time (minutes):  30   Critical care time was exclusive of:  Separately billable procedures and treating other patients   Critical care was necessary to treat or prevent imminent or life-threatening deterioration of the following conditions:  CNS failure or compromise   Critical care was time spent personally by me on the following  activities:  Development of treatment plan with patient or surrogate, discussions with consultants, evaluation of patient's response to treatment, examination of patient, ordering and review of laboratory studies, ordering and review of radiographic studies, ordering and performing treatments and interventions, pulse oximetry, re-evaluation of patient's condition, review of old charts and obtaining history from patient or surrogate   Care discussed with: admitting provider      MEDICATIONS ORDERED IN ED: Medications  levETIRAcetam (KEPPRA) IVPB 1000 mg/100 mL premix (has no administration in time range)     IMPRESSION / MDM / ASSESSMENT AND PLAN / ED COURSE  I reviewed the triage vital signs and the nursing notes.  82 year old female with PMH as noted above presents with acute onset of increased left-sided weakness and slurred speech around 6:35 PM.  The symptoms have resolved except for the dysarthric speech.  Differential diagnosis includes, but is not limited to, CVA, TIA, seizure, near syncope, electrolyte abnormality, hypoglycemia, other metabolic etiology.  CT head is negative for acute findings.  We will obtain lab workup and consult with teleneurology.  Patient's presentation is most consistent with acute presentation with potential threat to life or bodily function.  The patient is on the cardiac monitor to evaluate for evidence of arrhythmia and/or significant heart rate changes.  ----------------------------------------- 9:08 PM on 06/11/2023 -----------------------------------------  Lab workup shows chronic anemia and CKD.  There are no significant electrolyte abnormalities.  There is no leukocytosis.  I consulted and discussed the case with Dr. Imogene Burn from teleneurology.  He obtained additional history from the family who states that the patient had some grunting and possible convulsive activity during the episode.  He feels that she may have had a seizure.  He recommends  starting her IV Keppra and admitting her to the hospitalist for further stroke and seizure workup.  I consulted Dr. Arville Care from the hospitalist service; based on our discussion he agrees to evaluate the patient for admission.   FINAL CLINICAL IMPRESSION(S) / ED DIAGNOSES   Final diagnoses:  Dysarthria  Left-sided weakness  Seizure-like activity ([name])     Rx / DC Orders   ED Discharge Orders     None        Note:  This document was prepared using Dragon voice recognition software and may include unintentional dictation errors.    Dionne Bucy, MD 06/11/23 2109

## 2023-06-11 NOTE — Assessment & Plan Note (Addendum)
-   The patient will be placed on supplemental coverage with NovoLog. - We will continue her Tradjenta.

## 2023-06-11 NOTE — Progress Notes (Signed)
   06/11/23 2000  Spiritual Encounters  Type of Visit Initial  Referral source Code page  Reason for visit Code  OnCall Visit Yes   Chaplain responded to Code Stroke. Patient at CT. No family present. Chaplain services available if needed. Thank you for allowing Chaplain to serve.

## 2023-06-11 NOTE — Assessment & Plan Note (Signed)
-  We will continue her antihypertensive therapy 

## 2023-06-11 NOTE — Progress Notes (Addendum)
Pt came to MRI, once pt was on scan table, pt vomited; moved pt back to stretcher and cleaned up; RN notified and came to give N/V meds.  After waiting fifteen minutes, layed pt down flat in stretcher and pt started to vomit again; pt taken back to ED; MD notified and stated to try again in AM with phenergan.

## 2023-06-11 NOTE — Assessment & Plan Note (Signed)
-   We will continue statin therapy. 

## 2023-06-11 NOTE — Assessment & Plan Note (Signed)
-   She was started on IV Keppra. - Will obtain an EEG. - She will have neurology consultation as mentioned above.

## 2023-06-11 NOTE — Assessment & Plan Note (Addendum)
-   The patient will need to be ruled out acute CVA given worsening left-sided weakness. - The patient will be admitted to an observation medically monitored bed.   - We will follow neuro checks q.4 hours for 24 hours.   - The patient will be continued on aspirin and Plavix..   - Will obtain a brain MRI without contrast as well as CTA of the head and neck and 2D echo with bubble study .   - A neurology consultation  as well as physical/occupation/speech therapy consults will be obtained in a.m.Marland Kitchen - I notified Dr. Wilford Corner about the patient. - The patient will be placed on statin therapy and fasting lipids will be checked.

## 2023-06-11 NOTE — H&P (Signed)
[place]   PATIENT NAME: Alicia Garner    MR#:  [id number]  DATE OF BIRTH:  [birth year]  DATE OF ADMISSION:  06/11/2023  PRIMARY CARE PHYSICIAN: [facility]   Patient is coming from: Home  REQUESTING/REFERRING PHYSICIAN: Miki Kins, MD  CHIEF COMPLAINT:   Chief Complaint  Patient presents with   Code Stroke    LKN [phone] (previous Hx of CVA w deficits)  Family states pt was eating and noticed slurred speech and LLE/LUE weakness. EMS states symptoms resolved on arrival. FSBS 231. Family believed speech remained slurred more than normal on arrival    HISTORY OF PRESENT ILLNESS:  Alicia Garner is a 82 y.o. African-American female with medical history significant for CVA with left-sided weakness, essential hypertension, dyslipidemia, type 2 diabetes mellitus, stage IV chronic kidney disease and chronic anemia, who presented to the emergency room with acute onset of worsening left-sided weakness with associated slurred speech that started around 6:35 PM.  Per EMS the patient was witnessed to have left upper and lower extremity weakness compared to baseline and developed dysarthria.  In the ER her weakness is returned to her baseline but her family thought her slurred speech was still there.  She denied any headache or dizziness or blurred vision.  No tinnitus or vertigo.  No urinary or stool incontinence.  No chest pain or palpitations.  No cough or wheezing or dyspnea.  No dysuria, oliguria or hematuria or flank pain.  No bleeding diathesis.  During teleneuro consult patient developed stiffness in both arms then grunting noises lasting 3 to 5 minutes when she was awake but disoriented and followed commands.  ED Course: When she came to the ER, BP was 143/83 with a heart rate of 102 with otherwise normal vital signs.  Labs revealed anemia better than her previous levels and mild thrombocytopenia and CMP revealed a BUN of 41 and creatinine 2.52 better than previous levels  with calcium 8.8 and CO2 17, blood glucose of 191. EKG as reviewed by me : EKG showed sinus tachycardia with rate 103 with left axis deviation and T wave inversion laterally. Imaging: Code stroke CT showed no acute intracranial malady.  It showed remote lacunar infarcts throughout the basal ganglia, thalami and pons.  The patient had a teleneurology consult and the recommendation was for stroke workup and due to suspicion about seizure recommendation was for IV Keppra.  The patient was given 1 g of IV Keppra.  She will be admitted to an observation medical telemetry bed for further evaluation and management. PAST MEDICAL HISTORY:   Past Medical History:  Diagnosis Date   Diabetes mellitus without complication ([name])    Hyperlipidemia    Hypertension    Stroke ([name])     PAST SURGICAL HISTORY:  No past surgical history on file.  SOCIAL HISTORY:   Social History   Tobacco Use   Smoking status: Never   Smokeless tobacco: Never  Substance Use Topics   Alcohol use: Never    FAMILY HISTORY:   Family History  Problem Relation Age of Onset   Hypertension Mother    Diabetes Mother    Diabetes Father    Hypertension Father     DRUG ALLERGIES:   Allergies  Allergen Reactions   Lisinopril Swelling    REVIEW OF SYSTEMS:   ROS As per history of present illness. All pertinent systems were reviewed above. Constitutional, HEENT, cardiovascular, respiratory, GI, GU, musculoskeletal, neuro, psychiatric, endocrine, integumentary and hematologic systems  were reviewed and are otherwise negative/unremarkable except for positive findings mentioned above in the HPI.   MEDICATIONS AT HOME:   Prior to Admission medications   Medication Sig Start Date End Date Taking? Authorizing Provider  acetaminophen (TYLENOL) 650 MG CR tablet Take 650 mg by mouth every 8 (eight) hours as needed. 02/25/23  Yes [provider]  amLODipine (NORVASC) 10 MG tablet Take 10 mg by mouth daily. 10/10/21   Yes [provider]  amLODipine (NORVASC) 10 MG tablet Take 10 mg by mouth daily. 02/25/23  Yes [provider]  aspirin EC 81 MG tablet Take 81 mg by mouth daily. Swallow whole.   Yes [provider]  Cholecalciferol 25 MCG (1000 UT) capsule Take by mouth. 12/22/18  Yes [provider]  clopidogrel (PLAVIX) 75 MG tablet Take 1 tablet (75 mg total) by mouth daily. 10/14/22  Yes Arnetha Courser, MD  cyanocobalamin 1000 MCG tablet Take 1 tablet (1,000 mcg total) by mouth daily. 10/14/22  Yes Arnetha Courser, MD  diclofenac Sodium (VOLTAREN) 1 % GEL Apply 2 g topically 4 (four) times daily.   Yes [provider]  polyethylene glycol (MIRALAX) 17 g packet Take 17 g by mouth daily as needed. 02/25/23  Yes [provider]  potassium chloride SA (KLOR-CON M) 20 MEQ tablet Take 20 mEq by mouth 2 (two) times daily. 03/23/23  Yes [provider]  sitaGLIPtin (JANUVIA) 25 MG tablet Take 25 mg by mouth daily. 02/25/23  Yes [provider]  atenolol (TENORMIN) 25 MG tablet Take 1 tablet (25 mg total) by mouth daily. Patient not taking: Reported on 06/11/2023 10/26/22   Kathrynn Running, MD  linagliptin (TRADJENTA) 5 MG TABS tablet Take 1 tablet (5 mg total) by mouth daily. Patient not taking: Reported on 06/11/2023 10/25/22   Kathrynn Running, MD  polyethylene glycol (MIRALAX / GLYCOLAX) 17 g packet Take 17 g by mouth daily as needed for moderate constipation. 12/08/21   Alford Highland, MD  rosuvastatin (CRESTOR) 40 MG tablet Take 1 tablet (40 mg total) by mouth daily. Patient not taking: Reported on 06/11/2023 10/14/22   Arnetha Courser, MD      VITAL SIGNS:  Blood pressure (!) 148/99, pulse (!) 102, temperature 98.5 F (36.9 C), temperature source Oral, resp. rate 18, height 5\' 4"  (1.626 m), weight 73.4 kg, SpO2 100%.  PHYSICAL EXAMINATION:  Physical Exam  GENERAL:  82 y.o.-year-old African-American female patient lying in the bed with no acute  distress.  EYES: Pupils equal, round, reactive to light and accommodation. No scleral icterus. Extraocular muscles intact.  HEENT: Head atraumatic, normocephalic. Oropharynx and nasopharynx clear.  NECK:  Supple, no jugular venous distention. No thyroid enlargement, no tenderness.  LUNGS: Normal breath sounds bilaterally, no wheezing, rales,rhonchi or crepitation. No use of accessory muscles of respiration.  CARDIOVASCULAR: Regular rate and rhythm, S1, S2 normal. No murmurs, rubs, or gallops.  ABDOMEN: Soft, nondistended, nontender. Bowel sounds present. No organomegaly or mass.  EXTREMITIES: No pedal edema, cyanosis, or clubbing.  NEUROLOGIC: Cranial nerves II through XII are intact except for mild dysarthria. Muscle strength 5/5 in right upper and lower extremities, 4/5 in the left lower extremity and 3-4/5 in the left upper extremity.  Sensation intact. Gait not checked.  PSYCHIATRIC: The patient is alert and oriented x 3.  Normal affect and good eye contact. SKIN: No obvious rash, lesion, or ulcer.   LABORATORY PANEL:   CBC Recent Labs  Lab 06/11/23 2022  WBC 6.3  HGB 10.2*  HCT 32.5*  PLT 148*   ------------------------------------------------------------------------------------------------------------------  Chemistries  Recent Labs  Lab 06/11/23 2022  NA 141  K 4.5  CL 113*  CO2 17*  GLUCOSE 191*  BUN 41*  CREATININE 2.52*  CALCIUM 8.8*  AST 15  ALT 11  ALKPHOS 64  BILITOT 0.7   ------------------------------------------------------------------------------------------------------------------  Cardiac Enzymes No results for input(s): "TROPONINI" in the last 168 hours. ------------------------------------------------------------------------------------------------------------------  RADIOLOGY:  CT HEAD CODE STROKE WO CONTRAST`  Result Date: 06/11/2023 CLINICAL DATA:  Code stroke. EXAM: CT HEAD WITHOUT CONTRAST TECHNIQUE: Contiguous axial images were obtained  from the base of the skull through the vertex without intravenous contrast. RADIATION DOSE REDUCTION: This exam was performed according to the departmental dose-optimization program which includes automated exposure control, adjustment of the mA and/or kV according to patient size and/or use of iterative reconstruction technique. COMPARISON:  10/18/2022 FINDINGS: Brain: No evidence of acute infarction, hemorrhage, mass, mass effect, or midline shift. No hydrocephalus or extra-axial collection. Age related cerebral atrophy. Remote lacunar infarcts throughout the basal ganglia thalami, and pons. Periventricular white matter changes, likely the sequela of chronic small vessel ischemic disease. Vascular: No hyperdense vessel. Skull: Negative for fracture or focal lesion. Sinuses/Orbits: Mucosal thickening in the maxillary sinuses. Status post bilateral lens replacements. Other: Trace fluid in the mastoid air cells. ASPECTS [facility] Stroke Program Early CT Score) - Ganglionic level infarction (caudate, lentiform nuclei, internal capsule, insula, M1-M3 cortex): 7 - Supraganglionic infarction (M4-M6 cortex): 3 Total score (0-10 with 10 being normal): 10 IMPRESSION: 1. No acute intracranial process. ASPECTS is 10. 2. Remote lacunar infarcts throughout the basal ganglia, thalami, and pons. Code stroke imaging results were communicated on 06/11/2023 at 8:13 pm to provider [facility] via telephone, who verbally acknowledged these results. Electronically Signed   By: Wiliam Ke M.D.   On: 06/11/2023 20:13      IMPRESSION AND PLAN:  Assessment and Plan: * Left-sided weakness - The patient will need to be ruled out acute CVA given worsening left-sided weakness. - The patient will be admitted to an observation medically monitored bed.   - We will follow neuro checks q.4 hours for 24 hours.   - The patient will be continued on aspirin and Plavix..   - Will obtain a brain MRI without contrast as well as CTA of the head  and neck and 2D echo with bubble study .   - A neurology consultation  as well as physical/occupation/speech therapy consults will be obtained in a.m.Marland Kitchen - I notified Dr. Wilford Corner about the patient. - The patient will be placed on statin therapy and fasting lipids will be checked.   Seizure-like activity ([name]) - She was started on IV Keppra. - We will place her on seizure precautions. - Will obtain an EEG. - She will have neurology consultation as mentioned above. -Will defer continuing Keppra to Dr. Wilford Corner in a.m.  Type 2 diabetes mellitus with chronic kidney disease, without long-term current use of insulin ([name]) - The patient will be placed on supplemental coverage with NovoLog. - We will continue her Tradjenta.  Essential hypertension - We will continue her antihypertensive therapy.  Dyslipidemia - We will continue statin therapy.    DVT prophylaxis: Lovenox. Advanced Care Planning:  Code Status: The patient is DNR and DNI. Family Communication:  The plan of care was discussed in details with the patient (and family). I answered all questions. The patient agreed to proceed with the above mentioned plan. Further management will depend upon  hospital course. Disposition Plan: Back to previous home environment Consults called: Neurology All the records are reviewed and case discussed with ED provider.  Status is: Observation  I certify that at the time of admission, it is my clinical judgment that the patient will require  hospital care extending less than 2 midnights.                            Dispo: The patient is from: Home              Anticipated d/c is to: Home              Patient currently is not medically stable to d/c.              Difficult to place patient: No  Hannah Beat M.D on 06/12/2023 at 12:22 AM  [facility]   From 7 PM-7 AM, contact night-coverage [email or web address]  CC: Primary care physician; [facility]

## 2023-06-11 NOTE — Progress Notes (Signed)
2000 - Code Stroke Activated mRS 3, Patient with slurred speech, worsening LUE weakness. LKWT 1830 2000 - Patient to CT  2004 - Tele-Neurologist Paged 2007 - Dr. Rosanne Ashing on stroke cart  2013 - CT Head results given to Dr. Imogene Burn on camera 2015 - Patient returned to room  2018 - Spoke with daughter, [name] to verify LKWT, presentation, and baseline symptoms

## 2023-06-12 ENCOUNTER — Observation Stay: Payer: Medicare Other

## 2023-06-12 ENCOUNTER — Other Ambulatory Visit: Payer: Self-pay

## 2023-06-12 DIAGNOSIS — R569 Unspecified convulsions: Secondary | ICD-10-CM | POA: Diagnosis not present

## 2023-06-12 DIAGNOSIS — Z7401 Bed confinement status: Secondary | ICD-10-CM | POA: Diagnosis not present

## 2023-06-12 DIAGNOSIS — I130 Hypertensive heart and chronic kidney disease with heart failure and stage 1 through stage 4 chronic kidney disease, or unspecified chronic kidney disease: Secondary | ICD-10-CM | POA: Diagnosis present

## 2023-06-12 DIAGNOSIS — R531 Weakness: Secondary | ICD-10-CM | POA: Diagnosis not present

## 2023-06-12 DIAGNOSIS — D631 Anemia in chronic kidney disease: Secondary | ICD-10-CM | POA: Diagnosis present

## 2023-06-12 DIAGNOSIS — I6389 Other cerebral infarction: Secondary | ICD-10-CM | POA: Diagnosis not present

## 2023-06-12 DIAGNOSIS — Z7984 Long term (current) use of oral hypoglycemic drugs: Secondary | ICD-10-CM | POA: Diagnosis not present

## 2023-06-12 DIAGNOSIS — Z79899 Other long term (current) drug therapy: Secondary | ICD-10-CM | POA: Diagnosis not present

## 2023-06-12 DIAGNOSIS — I63531 Cerebral infarction due to unspecified occlusion or stenosis of right posterior cerebral artery: Secondary | ICD-10-CM | POA: Diagnosis present

## 2023-06-12 DIAGNOSIS — R112 Nausea with vomiting, unspecified: Secondary | ICD-10-CM | POA: Diagnosis present

## 2023-06-12 DIAGNOSIS — E1122 Type 2 diabetes mellitus with diabetic chronic kidney disease: Secondary | ICD-10-CM | POA: Diagnosis present

## 2023-06-12 DIAGNOSIS — R29706 NIHSS score 6: Secondary | ICD-10-CM | POA: Diagnosis present

## 2023-06-12 DIAGNOSIS — G8194 Hemiplegia, unspecified affecting left nondominant side: Secondary | ICD-10-CM | POA: Diagnosis present

## 2023-06-12 DIAGNOSIS — R4781 Slurred speech: Secondary | ICD-10-CM | POA: Diagnosis present

## 2023-06-12 DIAGNOSIS — G40209 Localization-related (focal) (partial) symptomatic epilepsy and epileptic syndromes with complex partial seizures, not intractable, without status epilepticus: Secondary | ICD-10-CM | POA: Diagnosis not present

## 2023-06-12 DIAGNOSIS — Z7902 Long term (current) use of antithrombotics/antiplatelets: Secondary | ICD-10-CM | POA: Diagnosis not present

## 2023-06-12 DIAGNOSIS — Z794 Long term (current) use of insulin: Secondary | ICD-10-CM | POA: Diagnosis not present

## 2023-06-12 DIAGNOSIS — I6782 Cerebral ischemia: Secondary | ICD-10-CM | POA: Diagnosis present

## 2023-06-12 DIAGNOSIS — E785 Hyperlipidemia, unspecified: Secondary | ICD-10-CM | POA: Diagnosis present

## 2023-06-12 DIAGNOSIS — Z66 Do not resuscitate: Secondary | ICD-10-CM | POA: Diagnosis present

## 2023-06-12 DIAGNOSIS — I69354 Hemiplegia and hemiparesis following cerebral infarction affecting left non-dominant side: Secondary | ICD-10-CM | POA: Diagnosis not present

## 2023-06-12 DIAGNOSIS — Z8249 Family history of ischemic heart disease and other diseases of the circulatory system: Secondary | ICD-10-CM | POA: Diagnosis not present

## 2023-06-12 DIAGNOSIS — F0390 Unspecified dementia without behavioral disturbance: Secondary | ICD-10-CM | POA: Diagnosis present

## 2023-06-12 DIAGNOSIS — R627 Adult failure to thrive: Secondary | ICD-10-CM | POA: Diagnosis present

## 2023-06-12 DIAGNOSIS — Z833 Family history of diabetes mellitus: Secondary | ICD-10-CM | POA: Diagnosis not present

## 2023-06-12 DIAGNOSIS — N184 Chronic kidney disease, stage 4 (severe): Secondary | ICD-10-CM | POA: Diagnosis present

## 2023-06-12 DIAGNOSIS — I639 Cerebral infarction, unspecified: Secondary | ICD-10-CM | POA: Diagnosis present

## 2023-06-12 DIAGNOSIS — I5032 Chronic diastolic (congestive) heart failure: Secondary | ICD-10-CM | POA: Diagnosis present

## 2023-06-12 DIAGNOSIS — Z7982 Long term (current) use of aspirin: Secondary | ICD-10-CM | POA: Diagnosis not present

## 2023-06-12 DIAGNOSIS — R471 Dysarthria and anarthria: Secondary | ICD-10-CM | POA: Diagnosis present

## 2023-06-12 LAB — GLUCOSE, CAPILLARY
Glucose-Capillary: 67 mg/dL — ABNORMAL LOW (ref 70–99)
Glucose-Capillary: 79 mg/dL (ref 70–99)
Glucose-Capillary: 80 mg/dL (ref 70–99)
Glucose-Capillary: 68 mg/dL — ABNORMAL LOW (ref 70–99)
Glucose-Capillary: 75 mg/dL (ref 70–99)
Glucose-Capillary: 77 mg/dL (ref 70–99)
Glucose-Capillary: 109 mg/dL — ABNORMAL HIGH (ref 70–99)

## 2023-06-12 LAB — CBG MONITORING, ED: Glucose-Capillary: 98 mg/dL (ref 70–99)

## 2023-06-12 LAB — TROPONIN I (HIGH SENSITIVITY): Troponin I (High Sensitivity): 7 ng/L (ref <18)

## 2023-06-12 LAB — LIPID PANEL
Cholesterol: 194 mg/dL (ref 0–200)
HDL: 53 mg/dL (ref >40)
LDL Cholesterol: 130 mg/dL — ABNORMAL HIGH (ref 0–99)
Total CHOL/HDL Ratio: 3.7 RATIO
Triglycerides: 54 mg/dL (ref <150)
VLDL: 11 mg/dL (ref 0–40)

## 2023-06-12 LAB — HEMOGLOBIN A1C
Hgb A1c MFr Bld: 5.8 % — ABNORMAL HIGH (ref 4.8–5.6)
Mean Plasma Glucose: 119.76 mg/dL

## 2023-06-12 MED ORDER — LORAZEPAM 2 MG/ML PO CONC: 2.0000 mg | ORAL | Status: DC | PRN

## 2023-06-12 MED ORDER — INSULIN ASPART 100 UNIT/ML IJ SOLN: 0.0000 [IU] | Freq: Every day | INTRAMUSCULAR | Status: DC

## 2023-06-12 MED ORDER — ENOXAPARIN SODIUM 30 MG/0.3ML IJ SOSY
30.0000 mg | PREFILLED_SYRINGE | INTRAMUSCULAR | Status: DC
  Administered 2023-06-12 – 2023-06-13 (×2): 30 mg via SUBCUTANEOUS
  Filled 2023-06-12 (×2): qty 0.3

## 2023-06-12 MED ORDER — INSULIN ASPART 100 UNIT/ML IJ SOLN: 0.0000 [IU] | Freq: Three times a day (TID) | INTRAMUSCULAR | Status: DC

## 2023-06-12 MED ORDER — LEVETIRACETAM 500 MG PO TABS
500.0000 mg | ORAL_TABLET | Freq: Two times a day (BID) | ORAL | Status: DC
  Administered 2023-06-12: 500 mg via ORAL
  Filled 2023-06-12: qty 1

## 2023-06-12 NOTE — Evaluation (Signed)
Physical Therapy Evaluation Patient Details Name: Alicia Garner MRN: [id number] DOB: Sep 28, 1940 Today's Date: 06/12/2023  History of Present Illness  82 y/o female presented to ED on 06/11/23 for L sided weakness with associated slurred speech. CT head negative. PMH: CVA with L sided weakness, HTN, T2DM, CKD stage IV, chronic anemia.  Clinical Impression  Patient admitted with the above. PTA, patient lives with daughter who provides assistance with w/c transfers with maxA. Patient presents with baseline L sided weakness, impaired balance, impaired functional mobility, and decreased activity tolerance. Patient required modA for bed mobility and maxA for simulated squat pivot transfer. Daughter reports feeling as though patient is close to baseline but would like PT to follow while hospitalized but does not want PT follow up. Patient will benefit from skilled PT services during acute stay to address listed deficits.     If plan is discharge home, recommend the following: A lot of help with walking and/or transfers;A lot of help with bathing/dressing/bathroom;Assistance with cooking/housework;Direct supervision/assist for financial management;Direct supervision/assist for medications management;Assist for transportation;Help with stairs or ramp for entrance   Can travel by private vehicle        Equipment Recommendations None recommended by PT  Recommendations for Other Services       Functional Status Assessment Patient has had a recent decline in their functional status and demonstrates the ability to make significant improvements in function in a reasonable and predictable amount of time.     Precautions / Restrictions Precautions Precautions: Fall Precaution Comments: w/c bound at baseline Restrictions Weight Bearing Restrictions: No      Mobility  Bed Mobility Overal bed mobility: Needs Assistance Bed Mobility: Supine to Sit, Sit to Supine     Supine to sit: Mod assist Sit to  supine: Min assist   General bed mobility comments: assist for LE management and trunk management    Transfers Overall transfer level: Needs assistance Equipment used: 2 person hand held assist Transfers: Bed to chair/wheelchair/BSC       Squat pivot transfers: Max assist     General transfer comment: simulated squat pivot transfer to higher point in the bed with maxA to clear buttocks from bed    Ambulation/Gait                  Stairs            Wheelchair Mobility     Tilt Bed    Modified Rankin (Stroke Patients Only)       Balance Overall balance assessment: Needs assistance Sitting-balance support: No upper extremity supported, Feet supported Sitting balance-Leahy Scale: Fair Sitting balance - Comments: R lateral lean with additional task (i.e eating breakfast) Postural control: Right lateral lean                                   Pertinent Vitals/Pain Pain Assessment Pain Assessment: No/denies pain    Home Living Family/patient expects to be discharged to:: Private residence Living Arrangements: Children Available Help at Discharge: Family;Available 24 hours/day Type of Home: House Home Access: Stairs to enter   [employer] of Steps: 2   Home Layout: One level Home Equipment: Agricultural consultant (2 wheels);BSC/3in1;Wheelchair - manual;Hospital bed      Prior Function Prior Level of Function : Needs assist             Mobility Comments: per daughter, w/c bound with requiring min-modA for bed mobility and  maxA for transfers to w/c ADLs Comments: assist for ADLs     Extremity/Trunk Assessment   Upper Extremity Assessment Upper Extremity Assessment: Defer to OT evaluation    Lower Extremity Assessment Lower Extremity Assessment: LLE deficits/detail LLE Deficits / Details: grossly 4-/5    Cervical / Trunk Assessment Cervical / Trunk Assessment: Kyphotic  Communication   Communication Communication:  No apparent difficulties  Cognition Arousal: Alert Behavior During Therapy: WFL for tasks assessed/performed Overall Cognitive Status: History of cognitive impairments - at baseline                                          General Comments      Exercises     Assessment/Plan    PT Assessment Patient needs continued PT services  PT Problem List Decreased strength;Decreased activity tolerance;Decreased balance;Decreased mobility;Decreased cognition       PT Treatment Interventions DME instruction;Functional mobility training;Therapeutic activities;Therapeutic exercise;Balance training;Patient/family education    PT Goals (Current goals can be found in the Care Plan section)  Acute Rehab PT Goals Patient Stated Goal: per daughter, to go home PT Goal Formulation: With family Time For Goal Achievement: 06/26/23 Potential to Achieve Goals: Poor    Frequency Min 1X/week     Co-evaluation               AM-PAC PT "6 Clicks" Mobility  Outcome Measure Help needed turning from your back to your side while in a flat bed without using bedrails?: A Lot Help needed moving from lying on your back to sitting on the side of a flat bed without using bedrails?: A Lot Help needed moving to and from a bed to a chair (including a wheelchair)?: A Lot Help needed standing up from a chair using your arms (e.g., wheelchair or bedside chair)?: Total Help needed to walk in hospital room?: Total Help needed climbing 3-5 steps with a railing? : Total 6 Click Score: 9    End of Session   Activity Tolerance: Patient tolerated treatment well Patient left: in bed;with call bell/phone within reach;with bed alarm set;with family/visitor present Nurse Communication: Mobility status PT Visit Diagnosis: Muscle weakness (generalized) (M62.81);Other abnormalities of gait and mobility (R26.89)    Time: 0938-1000 PT Time Calculation (min) (ACUTE ONLY): 22 min   Charges:   PT  Evaluation $PT Eval Moderate Complexity: 1 Mod   PT General Charges $$ ACUTE PT VISIT: 1 Visit         Maylon Peppers, PT, DPT Physical Therapist - [facility]  [facility]   Jarika Robben A Deshanda Molitor 06/12/2023, 10:48 AM

## 2023-06-12 NOTE — ED Notes (Addendum)
Patient had episode of urinary incontinence in bed. Writer and Ronnald Collum, RN changed linens, gown, brief, and performed peri care on patient.

## 2023-06-12 NOTE — ED Notes (Signed)
Advised nurse that patient has ready bed 

## 2023-06-12 NOTE — Consult Note (Signed)
Neurology Consultation Reason for Consult: Seizure vs. stroke Requesting Physician: Sunnie Nielsen  CC: Stiffening episode followed by worsened left sided weakness   History is obtained from: Daughter at bedside, chart review  HPI: Alicia Garner is a 82 y.o. female with a PMHx significant for dementia (MRI notable for likely severe chronic microhemmorhagic disease secondary to hypertension, less likely CAA), DM, HTN, HLD.   Daughter reports she was napping and woke up calling out for her daughter then had versive head turn to the right followed by generalized tonic clonic activity and grunting for 3-5 minutes. Post event she was confused and had worsened left sided weakness. This has gradually resolved and she is currently at her baseline  She is able to feed herself but otherwise dependent for all other care. Non-ambulatory. She had severely worsening renal function and failure to thrive earlier this year and was discharged on hospice care from [facility] in June, but with the adjustments made to her medications at that time her renal function has gradually improved, she is eating well now and has meaningful interactions with her family though oriented essentially only to self at baseline.   Initial notes report no prior seizure history but daughter notes this is the 3rd sterotyped episode   Regarding stroke history, daughter notes patient completed her Plavix approximately 1 month ago but tolerated it well when she was on it.    Spell Characteristics - Semiology:  Versive head turn to the right followed by generalized tonic clonic activity and grunting for 3-5 minutes - Prodome: Panic? Nausea/vomiting - Post-spell: Nausea, worsened left sided weakness / confusion / slurred speech, gradual return to baseline - Triggers: Unclear - Frequency: 1st event late 2023, second event Jan 2024, 3rd event 06/11/23  LKW: 06/11/2023 18:30 per telespecialist exam (see note 9/21 by Dr. Imogene Burn) Thrombolytic  given?: No, resolved symptoms on telespecialist exam (see note 9/21 by Dr. Imogene Burn) IA performed?: No, resolved symptoms on telespecialist exam (see note 9/21 by Dr. Imogene Burn) Premorbid modified rankin scale:      5 - Severe disability. Requires constant nursing care and attention, bedridden, incontinent.   ROS: Unable to obtain due to baseline dementia, obtained from family    Past Medical History:  Diagnosis Date   Diabetes mellitus without complication ([name])    Hyperlipidemia    Hypertension    Stroke ([name])    No past surgical history on file.  Current Outpatient Medications  Medication Instructions   acetaminophen (TYLENOL) 650 mg, Oral, Every 8 hours PRN   amLODipine (NORVASC) 10 mg, Oral, Daily   amLODipine (NORVASC) 10 mg, Oral, Daily   aspirin EC 81 mg, Oral, Daily, Swallow whole.   atenolol (TENORMIN) 25 mg, Oral, Daily   Cholecalciferol 25 MCG (1000 UT) capsule Oral   clopidogrel (PLAVIX) 75 mg, Oral, Daily   cyanocobalamin 1,000 mcg, Oral, Daily   diclofenac Sodium (VOLTAREN) 2 g, Topical, 4 times daily   linagliptin (TRADJENTA) 5 mg, Oral, Daily   polyethylene glycol (MIRALAX / GLYCOLAX) 17 g, Oral, Daily PRN   polyethylene glycol (MIRALAX) 17 g, Oral, Daily PRN   potassium chloride SA (KLOR-CON M) 20 MEQ tablet 20 mEq, Oral, 2 times daily   rosuvastatin (CRESTOR) 40 mg, Oral, Daily   sitaGLIPtin (JANUVIA) 25 mg, Oral, Daily    Family History  Problem Relation Age of Onset   Hypertension Mother    Diabetes Mother    Diabetes Father    Hypertension Father    Social History:  reports that  she has never smoked. She has never used smokeless tobacco. She reports that she does not drink alcohol and does not use drugs.   Exam: Current vital signs: BP (!) 143/72 (BP Location: Left Arm)   Pulse 74   Temp 98.4 F (36.9 C) (Oral)   Resp 19   Ht 5\' 4"  (1.626 m)   Wt 73.4 kg   LMP  (LMP Unknown)   SpO2 100%   BMI 27.78 kg/m  Vital signs in last 24 hours: Temp:   [97.9 F (36.6 C)-98.7 F (37.1 C)] 98.4 F (36.9 C) (09/23 1610) Pulse Rate:  [73-88] 74 (09/23 0812) Resp:  [19-20] 19 (09/23 0812) BP: (126-169)/(68-80) 143/72 (09/23 0812) SpO2:  [97 %-100 %] 100 % (09/23 9604)   Physical Exam  Constitutional: Appears chronically ill  Psych: Sleeping but awakens to light touch, interacts with grandchildren, smiling at the and conversant with them Eyes: No scleral injection HENT: No oropharyngeal obstruction.  MSK: no major joint deformities.  Cardiovascular: Perfusing extremities well Respiratory: Effort normal, non-labored breathing GI: Soft.  No distension.  Skin: Warm dry and intact visible skin  Neuro: Mental Status: Sleeping, answers age at 63, month as June, not oriented to situation, able to name simple objects, repeat and follow simple commands  Cranial Nerves: II: Visual Fields are full. Pupils reactive to light (resists exam, cannot comment if equal) III,IV, VI: Tracks examiner bilaterally  V: Facial sensation is symmetric to light eyelash brush and light touch VII: Facial movement is symmetric.  VIII: hearing is intact to voice Motor: Left hemiparesis at her baseline Sensory: Responsive to light touch throughout, reports equal sensation Cerebellar: FNF intact bilaterally  Gait:  Non ambulatory at baseline  NIHSS total 6 Score breakdown: 1 point for drowsiness, 2 for questions, 1 for left arm, 1 for left leg, 1 for right leg,   I have reviewed labs in epic and the results pertinent to this consultation are:  Basic Metabolic Panel: Recent Labs  Lab 06/11/23 2021/07/02 06/13/23 0659  NA 141 140  K 4.5 4.9  CL 113* 113*  CO2 17* 22  GLUCOSE 191* 79  BUN 41* 39*  CREATININE 2.52* 2.27*  CALCIUM 8.8* 8.8*    CBC: Recent Labs  Lab 06/11/23 2021/07/02  WBC 6.3  NEUTROABS 2.6  HGB 10.2*  HCT 32.5*  MCV 98.2  PLT 148*    Coagulation Studies: Recent Labs    06/11/23 Jul 02, 2021  LABPROT 14.1  INR 1.1     Lab Results   Component Value Date   CHOL 194 06/12/2023   HDL 53 06/12/2023   LDLCALC 130 (H) 06/12/2023   TRIG 54 06/12/2023   CHOLHDL 3.7 06/12/2023   Lab Results  Component Value Date   HGBA1C 5.8 (H) 06/11/2023    I have reviewed the images obtained:  MRI brain w/o contrast personally reviewed with family at bedside by myself  1. 6 mm acute nonhemorrhagic infarct along the inferior aspect of the posterior right temporal lobe [versus artifact on my read, with several other small foci of movement artifact vs. Stroke] 2. Mild atrophy and moderate diffuse confluent white matter disease likely reflects the sequela of chronic microvascular ischemia. 3. Multiple remote lacunar infarcts of the basal ganglia, thalami, and right cerebellum. 4. Extensive susceptibility artifacts throughout the basal ganglia and scattered over the convexities bilaterally. This likely reflects the sequela of chronic hypertension.    10/12/2022 carotid US 1. Mild-to-moderate atheromatous plaque about the carotid bifurcations, left greater than right,  but without hemodynamically significant greater than 50% stenosis by sonography. 2. Patent antegrade flow within the vertebral arteries bilaterally. 3. Diffuse tortuosity of the major arterial vasculature of the neck, most commonly seen in the setting of chronic underlying hypertension.  Jan ECHO  1. Left ventricular ejection fraction, by estimation, is 55 to 60%. The  left ventricle has normal function. The left ventricle has no regional  wall motion abnormalities. There is mild left ventricular hypertrophy.  Left ventricular diastolic parameters  are consistent with Grade I diastolic dysfunction (impaired relaxation).   2. Right ventricular systolic function is normal. The right ventricular  size is normal. Tricuspid regurgitation signal is inadequate for assessing  PA pressure.   3. The mitral valve is normal in structure. No evidence of mitral valve   regurgitation. No evidence of mitral stenosis.   4. The aortic valve is normal in structure. Aortic valve regurgitation is  not visualized. No aortic stenosis is present.   5. The inferior vena cava is normal in size with greater than 50%  respiratory variability, suggesting right atrial pressure of 3 mmHg.    Assessment:   Regarding seizure, history is very consistent with focal epilepsy given head turn at onset and stereotyped nature of events per daughter [name]. Likely secondary to prior strokes, atrophy/dementia.   Regarding possible stroke this seems less likely given patient's gradual recovery (more consistent with post-ictal Todd's; with severity of underlying microvascular disease would expect a fairly large deficit and very slow/incomplete recovery with even a relatively small stroke). However, given on my review there would be multiple possible strokes (vs. more likely artifact) I do think repeating ECHO is indicated.   Regarding goals of care are to maximize quality of life with treatment of easily treatable conditions without aggressive measures, daughter agrees patient would not want acute intervention such as TNK/tPA given significant chronic microhemorrhages and potential CAA; she would not want thrombectomy given again risk/benefit profile unlikely to be favorable due to her baseline debility   Impression:  - Focal seizures  - Possible multifocal stroke vs. Motion artifact - Dementia, likely vascular, less likely underlying CAA   Recommendations: # Focal epilepsy - Keppra 250 mg BID. Adjust as needed for renal function and seizure control Estimated Creatinine Clearance: 19.1 mL/min (A) (by C-G formula based on SCr of 2.27 mg/dL (H)).   CrCl 80 to 130 mL/minute/1.73 m2: 500 mg to 1.5 g every 12 hours.  CrCl 50 to <80 mL/minute/1.73 m2: 500 mg to 1 g every 12 hours.  CrCl 30 to <50 mL/minute/1.73 m2: 250 to 750 mg every 12 hours.  CrCl 15 to <30 mL/minute/1.73 m2: 250 to  500 mg every 12 hours.  CrCl <15 mL/minute/1.73 m2: 250 to 500 mg every 24 hours (expert opinion). - rescue medication to be prescribed at discharge, lorazepam 2 mg to be squirted into the cheek for seizure activity > 5 min (reviewed instructions with family) - seizure precautions reviewed with family with emphasis on medication adherence as the remainder do not apply given her level of care already meets these precautions - Patient prefers outpatient follow-up with [facility] clinic neurology, should be given number to call for appointment [phone] - Discussed with primary team via secure chat  # Possible stroke - Repeat ECHO, if left atrial enlargement is present will need 30 day event monitor - DAPT 21 days, then ASA alone - Neurology will follow-up ECHO but otherwise will sign off, please do not hesitate to reach out with any questions/concerns.  Thank you for involving [place] in care of this patient  Standard seizure precautions: Per [facility] statutes, patients with seizures are not allowed to drive until  they have been seizure-free for six months. Use caution when using heavy equipment or power tools. Avoid working on ladders or at heights. Take showers instead of baths. Ensure the water temperature is not too high on the home water heater. Do not go swimming alone. When caring for infants or small children, sit down when holding, feeding, or changing them to minimize risk of injury to the child in the event you have a seizure.  To reduce risk of seizures, maintain good sleep hygiene avoid alcohol and illicit drug use, take all anti-seizure medications as prescribed.    Brooke Dare MD-PhD [facility] [phone] [facility] coverage for [facility] is from 8 AM to 4 AM in-house and 4 PM to 8 PM by telephone/video. 8 PM to 8 AM emergent questions or overnight urgent questions should be addressed to Teleneurology On-call or [facility] neurohospitalist; contact  information can be found on AMION

## 2023-06-12 NOTE — Hospital Course (Addendum)
HPI: Alicia Garner is a 82 y.o. African-American female with medical history significant for CVA with left-sided weakness, essential hypertension, dyslipidemia, type 2 diabetes mellitus, stage IV chronic kidney disease and chronic anemia, who presented to the emergency room with acute onset of worsening left-sided weakness with associated slurred speech that started around 6:35 PM. Per daughter's description which I confirmed today 09/22, pt was sitting in wheelchair watching TV nodded off about 5 mins, woke up calling daughter's name, reaching out for her, then "out of it" locking up her arms, eyes rolled up into her head, making "ah ah" sounds like the was trying to talk. I asked daughter if she ever fell or passed out, if she seemed alert during this episode, daughter just said "she was out of it." This episode lasted about 2-3 minutes after which she seemed like her normal self except for feeling hot and daughter said she seemed like she was hurting. Daughter was asking patient if she was ok and patient was unable to form words just kept saying "ah ah". Daughter states pt vomited during the episode.  Per EMS the patient was witnessed to have left upper and lower extremity weakness compared to baseline and developed dysarthria.  In the ER her weakness is returned to her baseline but her family thought her slurred speech was still there.   Hospital course / significant events:  09/21: In ED, code stroke protocol, VS and labs unremarkable. CT head showed no acute intracranial abn, showed remote lacunar infarcts throughout the basal ganglia, thalami and pons. Per neuro, proceed w/ stroke workup and due to suspicion about seizure recommendation was for IV Keppra. The patient was given 1 g of IV Keppra.  09/22: unable to complete MRI, pt vomiting anytime lying down flat, despite phenergan, will reattempt this afternoon as well as EEG. Spoke w/ Dr Iver Nestle, neurology, continue antiepileptics pending EEG/MRI  09/23:  Echo still pending. EEG done pending read. Per neuro, continue antiepileptics.    Consultants:  Neurology   Procedures: none      ASSESSMENT & PLAN:   Left-sided weakness, likely acute CVA vs new seizure, see below  Acute CVA History CVA w/ residual L weakness Neuro checks, fall and seizure precautions  Permissive HTN for now  Echocardiogram pending  ASA, Plavix, statin PT, OT, SLP Risk stratification w/ A1C and lipids    Seizure-like activity, true seizure not confirmed  Keppra 500 mg bid maintenance  Ativan IV prn seizure seizure precautions. EEG pending read MRI brain (+)CVA Neurology following   Type 2 diabetes mellitus with chronic kidney disease, without long-term current use of insulin ([name]) SSI D/c home Tradjenta    Essential hypertension Permissive HTN for now    Dyslipidemia continue statin therapy.       No concerns based on BMI: Body mass index is 27.78 kg/m.   DVT prophylaxis: lovenox  IV fluids: stopped continuous IV fluids  Nutrition: carb modified Central lines / invasive devices: none  Code Status: DNR ACP documentation reviewed: MOST is on file in [facility], reviewed 06/12/23   TOC needs: TBD Barriers to dispo / significant pending items: EEG, Echo

## 2023-06-12 NOTE — ED Notes (Signed)
This RN message Sunnie Nielsen, attending, to notify of MRIs inability to obtain scan d/t patients intolerance of lying flat. Per MRI, patient vomits when lying flat.

## 2023-06-12 NOTE — Progress Notes (Signed)
Pt brought back to MRI this AM after receiving phenergan for N/V while laying flat.  Pt still unable to lay flat without vomiting.  ED RN Morrie Sheldon D notified. Pt sent to room 126

## 2023-06-12 NOTE — Plan of Care (Signed)
Notified by Dr. Lyn Hollingshead that patient's MRI did reveal a new stroke.  If fever further sequelae of her microvascular disease.  Although recommended for only 3 months of DAPT after her last stroke in January, she is apparently on chronic DAPT at this time.  Will need to further clarify whether the event that occurred was more clinically consistent with stroke or seizure  In the meantime I will hold Keppra to increase diagnostic clarity, ordered Ativan as needed for seizure activity and will follow-up EEG tomorrow.  Full consultation to follow tomorrow

## 2023-06-12 NOTE — Evaluation (Signed)
Speech Language Pathology Evaluation Patient Details Name: Alicia Garner MRN: [id number] DOB: 09/02/1941 Today's Date: 06/12/2023 Time: 2952-8413 SLP Time Calculation (min) (ACUTE ONLY): 12 min  Problem List:  Patient Active Problem List   Diagnosis Date Noted   Left-sided weakness 06/11/2023   Seizure-like activity ([name]) 06/11/2023   Type 2 diabetes mellitus with chronic kidney disease, without long-term current use of insulin ([name]) 06/11/2023   Dyslipidemia 06/11/2023   Dysarthria 06/11/2023   Acute renal failure superimposed on stage 4 chronic kidney disease ([name]) 10/18/2022   HLD (hyperlipidemia) 10/18/2022   Chronic diastolic CHF (congestive heart failure) ([name]) 10/18/2022   Gout 10/18/2022   Type II diabetes mellitus with renal manifestations ([name]) 10/18/2022   Generalized weakness 10/18/2022   Normocytic anemia 10/18/2022   Bilateral foot pain 10/18/2022   CVA (cerebral vascular accident) ([name]) 10/11/2022   CKD (chronic kidney disease) stage 4, GFR 15-29 ml/min ([name]) 10/11/2022   Constipation 12/07/2021   Weakness 12/06/2021   Anemia of chronic disease 12/05/2021   Elevated liver function tests 12/04/2021   Pain in joint, multiple sites 12/04/2021   Type 2 diabetes mellitus with hyperlipidemia ([name]) 12/04/2021   Essential hypertension 12/04/2021   DNR (do not resuscitate) 12/04/2021   Past Medical History:  Past Medical History:  Diagnosis Date   Diabetes mellitus without complication ([name])    Hyperlipidemia    Hypertension    Stroke [facility])    Past Surgical History: No past surgical history on file. HPI:  82 y/o female presented to ED on 06/11/23 for L sided weakness with associated slurred speech. CT head negative. PMH: CVA with L sided weakness, HTN, T2DM, CKD stage IV, chronic anemia.   Assessment / Plan / Recommendation Clinical Impression  Pt seen for speech/language and cognitive communication evaluation. Assessment completed via informal means. Pt with  waxing/waning LOA during evaluation. Of note, pt with baseline dementia which is likely impacting results of today's evaluation. No family present to provide additional details of PLOF. RN noted pt lives with daughter, is w/c bound, and receives [facility] services. Based on today's evaluation, pt presents with s/sx mild dysarthria c/b reduced vocal loudness and articulatory imprecision which is also likely affected by edentulism. Anomia noted with semantic paraphasias during confrontation naming. Limited initiation of speech and speech is often limited to short phrases. Pt able to follow 1-step commands and answer basic yes/no questions. Pt oriented to self and loosely to place. Pt with impaired attention, memory, problem solving, and insight. ?baseline level of functioning. Recommend consideration for [facility] SLP services for functional cognitive-linguistic evaluation and tx in home/functional environment. SLP to sign off at this level of care.    SLP Assessment  SLP Recommendation/Assessment: All further Speech Lanaguage Pathology  needs can be addressed in the next venue of care SLP Visit Diagnosis: Cognitive communication deficit (R41.841)    Recommendations for follow up therapy are one component of a multi-disciplinary discharge planning process, led by the attending physician.  Recommendations may be updated based on patient status, additional functional criteria and insurance authorization.    Follow Up Recommendations  Home health SLP    Assistance Recommended at Discharge  Frequent or constant Supervision/Assistance  Functional Status Assessment Patient has had a recent decline in their functional status and/or demonstrates limited ability to make significant improvements in function in a reasonable and predictable amount of time        SLP Evaluation Cognition  Overall Cognitive Status: History of cognitive impairments - at baseline Orientation Level: Oriented to  person;Oriented to  place;Disoriented to time;Disoriented to situation (loosely to place; hospital; "[facility]") Memory: Impaired Memory Impairment: Storage deficit;Retrieval deficit;Decreased recall of new information Problem Solving: Impaired Problem Solving Impairment: Functional basic Executive Function: Sequencing;Reasoning;Decision Making Reasoning: Impaired Reasoning Impairment: Verbal basic Sequencing: Impaired Sequencing Impairment: Verbal basic Decision Making: Impaired Decision Making Impairment: Verbal basic       Comprehension  Auditory Comprehension Overall Auditory Comprehension: Impaired Yes/No Questions: Impaired Complex Questions: 25-49% accurate Commands: Impaired Two Step Basic Commands: 25-49% accurate    Expression Expression Primary Mode of Expression: Verbal Verbal Expression Overall Verbal Expression: Impaired Initiation: Impaired Automatic Speech: Name;Social Response (WFL) Level of Generative/Spontaneous Verbalization:  (limited to short phrases) Repetition: Impaired Level of Impairment: Phrase level;Sentence level (?attention to task) Naming: Impairment Confrontation: Impaired Verbal Errors: Semantic paraphasias Written Expression Dominant Hand: Right Written Expression: Not tested   Oral / Motor  Oral Motor/Sensory Function Lingual ROM: Reduced right;Reduced left Lingual Strength: Reduced Motor Speech Overall Motor Speech: Impaired Respiration: Impaired Phonation: Low vocal intensity Resonance: Within functional limits Articulation: Impaired Level of Impairment:  (all levels) Intelligibility: Intelligibility reduced Word: 75-100% accurate Phrase: 75-100% accurate Sentence: 75-100% accurate Conversation: 75-100% accurate Motor Planning: Witnin functional limits           Alicia Garner, M.S., CCC-SLP Speech-Language Pathologist [facility] [phone] [name])  [name] 06/12/2023, 1:23 PM

## 2023-06-12 NOTE — Evaluation (Signed)
Occupational Therapy Evaluation Patient Details Name: Alicia Garner MRN: [id number] DOB: [birth year] Today's Date: 06/12/2023   History of Present Illness 82 y/o female presented to ED on 06/11/23 for L sided weakness with associated slurred speech. CT head negative. PMH: CVA with L sided weakness, HTN, T2DM, CKD stage IV, chronic anemia.   Clinical Impression   Upon entering the room, pt supine in bed and no family present at time of evaluation. Pt lives with daughter and is wheelchair level at baseline.Per chart review and staff report, pt feeds self but needs assistance for all self care and has an aide at baseline. Pt's family assists with transfer from bed at max A level. She appears to be at functional baseline for self care and mobility tasks at this time. Pt requires mod A to roll for repositioning and placed into R side lying position for skin intergrity. Pt declines further mobility and reports all self care performed from bed level at home. No family present to confirm. OT to complete orders at this time based on reported PLOF in regards to self care needs.       If plan is discharge home, recommend the following:      Functional Status Assessment  Patient has not had a recent decline in their functional status  Equipment Recommendations  None recommended by OT       Precautions / Restrictions Precautions Precautions: Fall Precaution Comments: w/c bound at baseline      Mobility Bed Mobility Overal bed mobility: Needs Assistance Bed Mobility: Rolling Rolling: Mod assist              Transfers                   General transfer comment: pt declined          ADL either performed or assessed with clinical judgement   ADL Overall ADL's : At baseline                                       General ADL Comments: Pt appears to be at baseline which is assistance for all self care tasks at home. Per staff she has an aide that assist with self  care and is wheelchair bound at baseline.     Vision Patient Visual Report: No change from baseline              Pertinent Vitals/Pain Pain Assessment Pain Assessment: Faces Faces Pain Scale: No hurt     Extremity/Trunk Assessment Upper Extremity Assessment Upper Extremity Assessment: Overall WFL for tasks assessed   Lower Extremity Assessment Lower Extremity Assessment: Overall WFL for tasks assessed       Communication Communication Communication: No apparent difficulties   Cognition Arousal: Alert Behavior During Therapy: WFL for tasks assessed/performed Overall Cognitive Status: History of cognitive impairments - at baseline                                                  Home Living Family/patient expects to be discharged to:: Private residence Living Arrangements: Children;Other relatives Available Help at Discharge: Family;Available 24 hours/day Type of Home: House Home Access: Stairs to enter [employer] of Steps: 2   Home Layout: One level     Bathroom  Shower/Tub: Sponge bathes at baseline         Home Equipment: Agricultural consultant (2 wheels);BSC/3in1;Wheelchair - manual;Hospital bed          Prior Functioning/Environment Prior Level of Function : Needs assist;Patient poor historian/Family not available             Mobility Comments: per daughter, w/c bound with requiring min-modA for bed mobility and maxA for transfers to w/c ADLs Comments: daughter no longer present but reported to PT that pt needs assistance for self care tasks. Pt reports self care tasks performed from bed level and RN reports aide assists at home.                 OT Goals(Current goals can be found in the care plan section) Acute Rehab OT Goals Patient Stated Goal: to rest OT Goal Formulation: With patient Time For Goal Achievement: 06/12/23 Potential to Achieve Goals: Fair  OT Frequency:         AM-PAC OT "6 Clicks" Daily  Activity     Outcome Measure Help from another person eating meals?: None Help from another person taking care of personal grooming?: A Little Help from another person toileting, which includes using toliet, bedpan, or urinal?: A Lot Help from another person bathing (including washing, rinsing, drying)?: A Lot Help from another person to put on and taking off regular upper body clothing?: A Lot Help from another person to put on and taking off regular lower body clothing?: A Lot 6 Click Score: 15   End of Session Nurse Communication: Mobility status  Activity Tolerance: Patient limited by fatigue Patient left: in bed;with bed alarm set;with chair alarm set                   Time: 4782-9562 OT Time Calculation (min): 12 min Charges:  OT General Charges $OT Visit: 1 Visit OT Evaluation $OT Eval Low Complexity: 1 Low  Jackquline Denmark, MS, OTR/L , CBIS ascom [phone]  06/12/23, 3:15 PM

## 2023-06-12 NOTE — Progress Notes (Addendum)
PROGRESS NOTE    Alicia Garner   WUJ:[id number] DOB: 1941-08-31  DOA: 06/11/2023 Date of Service: 06/12/23 which is hospital day 0  PCP: [facility]   HPI: Alicia Garner is a 82 y.o. African-American female with medical history significant for CVA with left-sided weakness, essential hypertension, dyslipidemia, type 2 diabetes mellitus, stage IV chronic kidney disease and chronic anemia, who presented to the emergency room with acute onset of worsening left-sided weakness with associated slurred speech that started around 6:35 PM. Per daughter's description which I confirmed today 09/22, pt was sitting in wheelchair watching TV nodded off about 5 mins, woke up calling daughter's name, reaching out for her, then "out of it" locking up her arms, eyes rolled up into her head, making "ah ah" sounds like the was trying to talk. I asked daughter if she ever fell or passed out, if she seemed alert during this episode, daughter just said "she was out of it." This episode lasted about 2-3 minutes after which she seemed like her normal self except for feeling hot and daughter said she seemed like she was hurting. Daughter was asking patient if she was ok and patient was unable to form words just kept saying "ah ah". Daughter states pt vomited during the episode.  Per EMS the patient was witnessed to have left upper and lower extremity weakness compared to baseline and developed dysarthria.  In the ER her weakness is returned to her baseline but her family thought her slurred speech was still there.   Hospital course / significant events:  09/21: In ED, code stroke protocol, VS and labs unremarkable. CT head showed no acute intracranial abn, showed remote lacunar infarcts throughout the basal ganglia, thalami and pons. Per neuro, proceed w/ stroke workup and due to suspicion about seizure recommendation was for IV Keppra. The patient was given 1 g of IV Keppra.  09/22: unable to complete MRI, pt vomiting  anytime lying down flat, despite phenergan, will reattempt this afternoon as well as EEG. Spoke w/ Dr Iver Nestle, neurology, continue antiepileptics pending EEG/MRI   Consultants:  Neurology   Procedures: none      ASSESSMENT & PLAN:   Left-sided weakness, likely acute CVA vs new seizure, see below  Acute CVA History CVA w/ residual L weakness Neuro checks, fall and seizure precautions  Permissive HTN for now  Echocardiogram pending  ASA, Plavix, statin PT, OT, SLP Risk stratification w/ A1C and lipids    Seizure-like activity, true seizure not confirmed  IV Keppra x1 followed by 500 mg bid maintenance - d/c for now to facilitate EEG tomorrow Ativan IV prn seizure seizure precautions. EEG pending  MRI brain (+)CVA Neurology following - appreciate recs re: confirm plan for antiepileptics    Type 2 diabetes mellitus with chronic kidney disease, without long-term current use of insulin ([name]) SSI Continue home Tradjenta    Essential hypertension Permissive HTN for now    Dyslipidemia continue statin therapy.       No concerns based on BMI: Body mass index is 27.78 kg/m.   DVT prophylaxis: lovenox  IV fluids: stopped continuous IV fluids  Nutrition: carb modified Central lines / invasive devices: none  Code Status: DNR ACP documentation reviewed: MOST is on file in [facility], reviewed 06/12/23   TOC needs: TBD Barriers to dispo / significant pending items: EEG, MRI brain              Subjective / Brief ROS:  Patient unable to contribute w/ details information  but answers "no" when asked about pain, trouble breathing, nausea, headache   Family Communication: confirms chroinc L side weakness and daughter [name] states patient is back to her normal self today. Reports occasional choking w/ drinking water but otherwise good appetite and no issues eating.     Objective Findings:  Vitals:   06/12/23 0600 06/12/23 0630 06/12/23 0822 06/12/23 1222  BP:  (!) 142/78 (!) 157/71 (!) 171/99 (!) 151/79  Pulse: 69 64 88 88  Resp:   18 19  Temp:   98.3 F (36.8 C) 98.7 F (37.1 C)  TempSrc:   Oral Oral  SpO2: 100% 100% 99% 97%  Weight:      Height:        Intake/Output Summary (Last 24 hours) at 06/12/2023 1612 Last data filed at 06/12/2023 1500 Gross per 24 hour  Intake 150 ml  Output --  Net 150 ml   Filed Weights   06/11/23 2049  Weight: 73.4 kg    Examination:  Physical Exam Constitutional:      General: She is not in acute distress. Cardiovascular:     Rate and Rhythm: Normal rate and regular rhythm.  Pulmonary:     Effort: Pulmonary effort is normal. No respiratory distress.     Breath sounds: Normal breath sounds.  Abdominal:     General: Abdomen is flat.     Palpations: Abdomen is soft.  Musculoskeletal:     Right lower leg: No edema.     Left lower leg: No edema.  Skin:    General: Skin is warm and dry.  Neurological:     Mental Status: She is alert. She is disoriented.     Motor: Weakness (RUE weaker than LUE) present.     Comments: Disoriented, cooperative and follows commands   Psychiatric:        Behavior: Behavior normal.          Scheduled Medications:   amLODipine  10 mg Oral Daily   aspirin EC  81 mg Oral Daily   clopidogrel  75 mg Oral Daily   cyanocobalamin  1,000 mcg Oral Daily   enoxaparin (LOVENOX) injection  30 mg Subcutaneous Q24H   insulin aspart  0-5 Units Subcutaneous QHS   insulin aspart  0-9 Units Subcutaneous TID WC   linagliptin  5 mg Oral Daily   rosuvastatin  20 mg Oral Daily    Continuous Infusions:  promethazine (PHENERGAN) injection (IM or IVPB) Stopped (06/12/23 0851)    PRN Medications:  acetaminophen **OR** acetaminophen (TYLENOL) oral liquid 160 mg/5 mL **OR** acetaminophen, LORazepam, ondansetron (ZOFRAN) IV, polyethylene glycol, promethazine (PHENERGAN) injection (IM or IVPB), senna-docusate, traZODone  Antimicrobials from admission:  Anti-infectives (From  admission, onward)    None           Data Reviewed:  I have personally reviewed the following...  CBC: Recent Labs  Lab 06/11/23 2022  WBC 6.3  NEUTROABS 2.6  HGB 10.2*  HCT 32.5*  MCV 98.2  PLT 148*   Basic Metabolic Panel: Recent Labs  Lab 06/11/23 2022  NA 141  K 4.5  CL 113*  CO2 17*  GLUCOSE 191*  BUN 41*  CREATININE 2.52*  CALCIUM 8.8*   GFR: Estimated Creatinine Clearance: 17.2 mL/min (A) (by C-G formula based on SCr of 2.52 mg/dL (H)). Liver Function Tests: Recent Labs  Lab 06/11/23 2022  AST 15  ALT 11  ALKPHOS 64  BILITOT 0.7  PROT 8.0  ALBUMIN 3.6   No results for  input(s): "LIPASE", "AMYLASE" in the last 168 hours. No results for input(s): "AMMONIA" in the last 168 hours. Coagulation Profile: Recent Labs  Lab 06/11/23 2022  INR 1.1   Cardiac Enzymes: No results for input(s): "CKTOTAL", "CKMB", "CKMBINDEX", "TROPONINI" in the last 168 hours. BNP (last 3 results) No results for input(s): "PROBNP" in the last 8760 hours. HbA1C: Recent Labs    06/11/23 2022  HGBA1C 5.8*   CBG: Recent Labs  Lab 06/11/23 2021 06/12/23 0039 06/12/23 0818 06/12/23 0847 06/12/23 1215  GLUCAP 169* 98 67* 79 80   Lipid Profile: Recent Labs    06/12/23 0040  CHOL 194  HDL 53  LDLCALC 130*  TRIG 54  CHOLHDL 3.7   Thyroid Function Tests: No results for input(s): "TSH", "T4TOTAL", "FREET4", "T3FREE", "THYROIDAB" in the last 72 hours. Anemia Panel: No results for input(s): "VITAMINB12", "FOLATE", "FERRITIN", "TIBC", "IRON", "RETICCTPCT" in the last 72 hours. Most Recent Urinalysis On File:     Component Value Date/Time   COLORURINE YELLOW (A) 10/18/2022 1106   APPEARANCEUR CLOUDY (A) 10/18/2022 1106   LABSPEC 1.013 10/18/2022 1106   PHURINE 5.0 10/18/2022 1106   GLUCOSEU NEGATIVE 10/18/2022 1106   HGBUR MODERATE (A) 10/18/2022 1106   BILIRUBINUR NEGATIVE 10/18/2022 1106   KETONESUR NEGATIVE 10/18/2022 1106   PROTEINUR 100 (A)  10/18/2022 1106   NITRITE NEGATIVE 10/18/2022 1106   LEUKOCYTESUR LARGE (A) 10/18/2022 1106   Sepsis Labs: @LABRCNTIP (procalcitonin:4,lacticidven:4) Microbiology: No results found for this or any previous visit (from the past 240 hour(s)).    Radiology Studies last 3 days: MR BRAIN WO CONTRAST  Result Date: 06/12/2023 CLINICAL DATA:  Neuro deficit, acute, stroke suspected. Left-sided weakness. EXAM: MRI HEAD WITHOUT CONTRAST TECHNIQUE: Multiplanar, multiecho pulse sequences of the brain and surrounding structures were obtained without intravenous contrast. COMPARISON:  CT head without contrast 06/11/2023. MR head without contrast 10/11/2022. FINDINGS: Brain: A 6 mm acute nonhemorrhagic infarct is present along the inferior aspect of the posterior right temporal lobe. No other acute infarct is present. Mild atrophy and moderate diffuse confluent white matter changes are present bilaterally. Extensive susceptibility artifacts are present throughout the basal ganglia and scattered over the convexities bilaterally. Multiple susceptibility foci are again noted within the brainstem and cerebellum. Multiple remote lacunar infarcts are present within the basal ganglia. Remote lacunar infarcts of the thalami are stable. Diffuse white matter changes extend into the brainstem. A remote lacunar infarct is present the right cerebellum. No acute hemorrhage is present. The ventricles are proportionate to the degree of atrophy. No significant extraaxial fluid collection is present. Vascular: Flow is present in the major intracranial arteries. Skull and upper cervical spine: The craniocervical junction is normal. Upper cervical spine is within normal limits. Marrow signal is unremarkable. Sinuses/Orbits: Insert normal lens replacement IMPRESSION: 1. 6 mm acute nonhemorrhagic infarct along the inferior aspect of the posterior right temporal lobe. 2. Mild atrophy and moderate diffuse confluent white matter disease likely  reflects the sequela of chronic microvascular ischemia. 3. Multiple remote lacunar infarcts of the basal ganglia, thalami, and right cerebellum. 4. Extensive susceptibility artifacts throughout the basal ganglia and scattered over the convexities bilaterally. This likely reflects the sequela of chronic hypertension. These results will be called to the ordering clinician or representative by the Radiologist Assistant, and communication documented in the PACS or [employer]. Electronically Signed   By: Marin Roberts M.D.   On: 06/12/2023 15:04   CT HEAD CODE STROKE WO CONTRAST`  Result Date: 06/11/2023 CLINICAL DATA:  Code  stroke. EXAM: CT HEAD WITHOUT CONTRAST TECHNIQUE: Contiguous axial images were obtained from the base of the skull through the vertex without intravenous contrast. RADIATION DOSE REDUCTION: This exam was performed according to the departmental dose-optimization program which includes automated exposure control, adjustment of the mA and/or kV according to patient size and/or use of iterative reconstruction technique. COMPARISON:  10/18/2022 FINDINGS: Brain: No evidence of acute infarction, hemorrhage, mass, mass effect, or midline shift. No hydrocephalus or extra-axial collection. Age related cerebral atrophy. Remote lacunar infarcts throughout the basal ganglia thalami, and pons. Periventricular white matter changes, likely the sequela of chronic small vessel ischemic disease. Vascular: No hyperdense vessel. Skull: Negative for fracture or focal lesion. Sinuses/Orbits: Mucosal thickening in the maxillary sinuses. Status post bilateral lens replacements. Other: Trace fluid in the mastoid air cells. ASPECTS [facility] Stroke Program Early CT Score) - Ganglionic level infarction (caudate, lentiform nuclei, internal capsule, insula, M1-M3 cortex): 7 - Supraganglionic infarction (M4-M6 cortex): 3 Total score (0-10 with 10 being normal): 10 IMPRESSION: 1. No acute intracranial process.  ASPECTS is 10. 2. Remote lacunar infarcts throughout the basal ganglia, thalami, and pons. Code stroke imaging results were communicated on 06/11/2023 at 8:13 pm to provider [facility] via telephone, who verbally acknowledged these results. Electronically Signed   By: Wiliam Ke M.D.   On: 06/11/2023 20:13       Time spent: 50 min    Sunnie Nielsen, DO [facility] 06/12/2023, 4:12 PM    Dictation software may have been used to generate the above note. Typos may occur and escape review in typed/dictated notes. Please contact Dr Lyn Hollingshead directly for clarity if needed.  Staff may message me via secure chat in Epic  but this may not receive an immediate response,  please page me for urgent matters!  If 7PM-7AM, please contact night coverage [email or web address]

## 2023-06-12 NOTE — Progress Notes (Signed)
PHARMACIST - PHYSICIAN COMMUNICATION  CONCERNING:  Enoxaparin (Lovenox) for DVT Prophylaxis   RECOMMENDATION: Patient was prescribed enoxaparin 40mg  every 24 hours for VTE prophylaxis.   Filed Weights   06/11/23 2049  Weight: 73.4 kg (161 lb 13.1 oz)   Body mass index is 27.78 kg/m.  Estimated Creatinine Clearance: 17.2 mL/min (A) (by C-G formula based on SCr of 2.52 mg/dL (H)).  Patient is candidate for enoxaparin 30mg  every 24 hours based on CrCl <51ml/min  DESCRIPTION: Pharmacy has adjusted enoxaparin dose per [facility] policy.  Patient is now receiving enoxaparin 30 mg every 24 hours.  Littie Deeds, PharmD PGY1 Pharmacy Resident 06/12/2023 7:18 AM

## 2023-06-12 NOTE — Plan of Care (Incomplete Revision)
Notified by Dr. Lyn Hollingshead that patient's MRI did reveal a new stroke.  I favor further sequelae of her microvascular disease, although imaging is motion limited so cannot rule out an embolic event.  Although recommended for only 3 months of DAPT after her last stroke in January, she is apparently on chronic DAPT at this time.  Will need to further clarify whether the event that occurred was more clinically consistent with stroke or seizure  In the meantime I will hold Keppra to increase diagnostic clarity, ordered Ativan as needed for seizure activity and will follow-up EEG tomorrow.  Full consultation to follow tomorrow

## 2023-06-13 ENCOUNTER — Ambulatory Visit: Payer: Medicare HMO

## 2023-06-13 DIAGNOSIS — G40209 Localization-related (focal) (partial) symptomatic epilepsy and epileptic syndromes with complex partial seizures, not intractable, without status epilepticus: Secondary | ICD-10-CM | POA: Diagnosis not present

## 2023-06-13 DIAGNOSIS — R569 Unspecified convulsions: Secondary | ICD-10-CM

## 2023-06-13 DIAGNOSIS — R531 Weakness: Secondary | ICD-10-CM

## 2023-06-13 LAB — GLUCOSE, CAPILLARY
Glucose-Capillary: 58 mg/dL — ABNORMAL LOW (ref 70–99)
Glucose-Capillary: 89 mg/dL (ref 70–99)
Glucose-Capillary: 73 mg/dL (ref 70–99)
Glucose-Capillary: 110 mg/dL — ABNORMAL HIGH (ref 70–99)
Glucose-Capillary: 119 mg/dL — ABNORMAL HIGH (ref 70–99)

## 2023-06-13 LAB — BASIC METABOLIC PANEL
Anion gap: 5 (ref 5–15)
BUN: 39 mg/dL — ABNORMAL HIGH (ref 8–23)
CO2: 22 mmol/L (ref 22–32)
Calcium: 8.8 mg/dL — ABNORMAL LOW (ref 8.9–10.3)
Chloride: 113 mmol/L — ABNORMAL HIGH (ref 98–111)
Creatinine, Ser: 2.27 mg/dL — ABNORMAL HIGH (ref 0.44–1.00)
GFR, Estimated: 21 mL/min — ABNORMAL LOW (ref >60)
Glucose, Bld: 79 mg/dL (ref 70–99)
Potassium: 4.9 mmol/L (ref 3.5–5.1)
Sodium: 140 mmol/L (ref 135–145)

## 2023-06-13 MED ORDER — LORAZEPAM 2 MG/ML PO CONC: 2.0000 mg | ORAL | Status: DC | PRN

## 2023-06-13 MED ORDER — LEVETIRACETAM 250 MG PO TABS
250.0000 mg | ORAL_TABLET | Freq: Two times a day (BID) | ORAL | Status: DC
  Administered 2023-06-13 – 2023-06-14 (×2): 250 mg via ORAL
  Filled 2023-06-13 (×3): qty 1

## 2023-06-13 NOTE — Progress Notes (Signed)
Physical Therapy Treatment Patient Details Name: Alicia Garner MRN: [id number] DOB: [birth year] Today's Date: 06/13/2023   History of Present Illness 82 y/o female presented to ED on 06/11/23 for L sided weakness with associated slurred speech. CT head negative. PMH: CVA with L sided weakness, HTN, T2DM, CKD stage IV, chronic anemia.    PT Comments  Therapist in to see pt this am, daughter at bedside. Pt completed AAROM B LE in bed with good tolerance, no visual signs of discomfort. Several supine<>sit transfers and rolling to don brief. Therapist acquired a w/c to transfer pt OOB with daughter's assist, however unable due to transport arriving to take pt for EEG. Will continue next available date/time per POC.    If plan is discharge home, recommend the following: A lot of help with walking and/or transfers;A lot of help with bathing/dressing/bathroom;Assistance with cooking/housework;Direct supervision/assist for financial management;Direct supervision/assist for medications management;Assist for transportation;Help with stairs or ramp for entrance   Can travel by private vehicle        Equipment Recommendations  None recommended by PT    Recommendations for Other Services       Precautions / Restrictions Precautions Precautions: Fall;Other (comment) (Sz precautions) Precaution Comments: w/c bound at baseline Restrictions Weight Bearing Restrictions: No     Mobility  Bed Mobility Overal bed mobility: Needs Assistance Bed Mobility: Rolling Rolling: Mod assist   Supine to sit: Mod assist Sit to supine: Min assist   General bed mobility comments: assist for LE management and trunk management    Transfers                   General transfer comment: Unable due to transport arriving for EEG    Ambulation/Gait               General Gait Details: non-ambulatory   Stairs             Wheelchair Mobility     Tilt Bed    Modified Rankin (Stroke Patients  Only)       Balance Overall balance assessment: Needs assistance Sitting-balance support: No upper extremity supported, Feet supported Sitting balance-Leahy Scale: Fair Sitting balance - Comments: Sitting EOB with Supervision for several minutes, no LOB                                    Cognition Arousal: Alert Behavior During Therapy: WFL for tasks assessed/performed Overall Cognitive Status: History of cognitive impairments - at baseline                                 General Comments: Pleasant and cooperative        Exercises General Exercises - Lower Extremity Ankle Circles/Pumps: AAROM, Both, 10 reps Heel Slides: AAROM, Both, 5 reps Hip ABduction/ADduction: AAROM, Both, 5 reps    General Comments General comments (skin integrity, edema, etc.): Pt incontinent at baseline, Rolling to don brief prior to planned transfer to w/c      Pertinent Vitals/Pain Pain Assessment Pain Assessment: No/denies pain    Home Living                          Prior Function            PT Goals (current goals can now be found in the care plan  section) Acute Rehab PT Goals Patient Stated Goal: per daughter, to go home    Frequency    Min 1X/week      PT Plan      Co-evaluation              AM-PAC PT "6 Clicks" Mobility   Outcome Measure  Help needed turning from your back to your side while in a flat bed without using bedrails?: A Lot Help needed moving from lying on your back to sitting on the side of a flat bed without using bedrails?: A Lot Help needed moving to and from a bed to a chair (including a wheelchair)?: A Lot Help needed standing up from a chair using your arms (e.g., wheelchair or bedside chair)?: Total Help needed to walk in hospital room?: Total Help needed climbing 3-5 steps with a railing? : Total 6 Click Score: 9    End of Session   Activity Tolerance: Patient tolerated treatment well Patient left:  in bed;with call bell/phone within reach;with bed alarm set;with family/visitor present Nurse Communication: Mobility status PT Visit Diagnosis: Muscle weakness (generalized) (M62.81);Other abnormalities of gait and mobility (R26.89)     Time: 4098-1191 PT Time Calculation (min) (ACUTE ONLY): 24 min  Charges:    $Therapeutic Exercise: 8-22 mins $Therapeutic Activity: 8-22 mins PT General Charges $$ ACUTE PT VISIT: 1 Visit                    Zadie Cleverly, PTA  Jannet Askew 06/13/2023, 2:06 PM

## 2023-06-13 NOTE — Procedures (Signed)
Patient Name: Alicia Garner  MRN: [id number]  Epilepsy Attending: Charlsie Quest  Referring Physician/Provider: Hannah Beat, MD  Date: 06/13/2023 Duration: 31.21 mins  Patient history: 82yo F with acute onset of worsening left-sided weakness with associated slurred speech. EEG to evaluate for seizure  Level of alertness: Awake, drowsy  AEDs during EEG study: None  Technical aspects: This EEG study was done with scalp electrodes positioned according to the 10-20 International system of electrode placement. Electrical activity was reviewed with band pass filter of 1-70Hz , sensitivity of 7 uV/mm, display speed of 25mm/sec with a 60Hz  notched filter applied as appropriate. EEG data were recorded continuously and digitally stored.  Video monitoring was available and reviewed as appropriate.  Description: The posterior dominant rhythm consists of 8-9 Hz activity of moderate voltage (25-35 uV) seen predominantly in posterior head regions, symmetric and reactive to eye opening and eye closing. Drowsiness was characterized by attenuation of the posterior background rhythm. Hyperventilation and photic stimulation were not performed.     IMPRESSION: This study is within normal limits. No seizures or epileptiform discharges were seen throughout the recording.  A normal interictal EEG does not exclude the diagnosis of epilepsy.   Alicia Garner Alicia Garner

## 2023-06-13 NOTE — Plan of Care (Signed)

## 2023-06-13 NOTE — Progress Notes (Signed)
PROGRESS NOTE    Alicia Garner   GNF:[id number] DOB: 1940-11-27  DOA: 06/11/2023 Date of Service: 06/13/23 which is hospital day 1  PCP: [facility], [facility]   HPI: Alicia Garner is a 82 y.o. African-American female with medical history significant for CVA with left-sided weakness, essential hypertension, dyslipidemia, type 2 diabetes mellitus, stage IV chronic kidney disease and chronic anemia, who presented to the emergency room with acute onset of worsening left-sided weakness with associated slurred speech that started around 6:35 PM. Per daughter's description which I confirmed today 09/22, pt was sitting in wheelchair watching TV nodded off about 5 mins, woke up calling daughter's name, reaching out for her, then "out of it" locking up her arms, eyes rolled up into her head, making "ah ah" sounds like the was trying to talk. I asked daughter if she ever fell or passed out, if she seemed alert during this episode, daughter just said "she was out of it." This episode lasted about 2-3 minutes after which she seemed like her normal self except for feeling hot and daughter said she seemed like she was hurting. Daughter was asking patient if she was ok and patient was unable to form words just kept saying "ah ah". Daughter states pt vomited during the episode.  Per EMS the patient was witnessed to have left upper and lower extremity weakness compared to baseline and developed dysarthria.  In the ER her weakness is returned to her baseline but her family thought her slurred speech was still there.   Hospital course / significant events:  09/21: In ED, code stroke protocol, VS and labs unremarkable. CT head showed no acute intracranial abn, showed remote lacunar infarcts throughout the basal ganglia, thalami and pons. Per neuro, proceed w/ stroke workup and due to suspicion about seizure recommendation was for IV Keppra. The patient was given 1 g of IV Keppra.  09/22: unable to complete MRI, pt vomiting  anytime lying down flat, despite phenergan, will reattempt this afternoon as well as EEG. Spoke w/ Dr Iver Nestle, neurology, continue antiepileptics pending EEG/MRI  09/23: Echo still pending. EEG done pending read. Per neuro, continue antiepileptics.    Consultants:  Neurology   Procedures: none      ASSESSMENT & PLAN:   Left-sided weakness, likely acute CVA vs new seizure, see below  Acute CVA History CVA w/ residual L weakness Neuro checks, fall and seizure precautions  Permissive HTN for now  Echocardiogram pending  ASA, Plavix, statin PT, OT, SLP Risk stratification w/ A1C and lipids    Seizure-like activity, true seizure not confirmed  Keppra 500 mg bid maintenance  Ativan IV prn seizure seizure precautions. EEG pending read MRI brain (+)CVA Neurology following   Type 2 diabetes mellitus with chronic kidney disease, without long-term current use of insulin ([name]) SSI D/c home Tradjenta    Essential hypertension Permissive HTN for now    Dyslipidemia continue statin therapy.       No concerns based on BMI: Body mass index is 27.78 kg/m.   DVT prophylaxis: lovenox  IV fluids: stopped continuous IV fluids  Nutrition: carb modified Central lines / invasive devices: none  Code Status: DNR ACP documentation reviewed: MOST is on file in [facility], reviewed 06/12/23   TOC needs: TBD Barriers to dispo / significant pending items: EEG, Echo             Subjective / Brief ROS:  Patient unable to contribute w/ details information  Family Communication: neurology has d/w daughter today  Objective Findings:  Vitals:   06/13/23 0519 06/13/23 0812 06/13/23 1212 06/13/23 1612  BP: (!) 169/80 (!) 143/72 133/66 114/65  Pulse: 79 74 77 92  Resp: 20 19 17 18   Temp: 98.3 F (36.8 C) 98.4 F (36.9 C) 98.2 F (36.8 C)   TempSrc:  Oral Oral Oral  SpO2: 98% 100% 100% 100%  Weight:      Height:        Intake/Output Summary (Last 24 hours) at  06/13/2023 1701 Last data filed at 06/13/2023 8657 Gross per 24 hour  Intake 240 ml  Output 1250 ml  Net -1010 ml   Filed Weights   06/11/23 2049  Weight: 73.4 kg    Examination:  Physical Exam Constitutional:      General: She is not in acute distress. Cardiovascular:     Rate and Rhythm: Normal rate and regular rhythm.  Pulmonary:     Effort: Pulmonary effort is normal. No respiratory distress.     Breath sounds: Normal breath sounds.  Abdominal:     General: Abdomen is flat.     Palpations: Abdomen is soft.  Musculoskeletal:     Right lower leg: No edema.     Left lower leg: No edema.  Skin:    General: Skin is warm and dry.  Neurological:     Mental Status: She is alert. She is disoriented.     Motor: Weakness (RUE weaker than LUE) present.     Comments: Disoriented, cooperative and follows commands   Psychiatric:        Behavior: Behavior normal.          Scheduled Medications:   amLODipine  10 mg Oral Daily   aspirin EC  81 mg Oral Daily   clopidogrel  75 mg Oral Daily   cyanocobalamin  1,000 mcg Oral Daily   enoxaparin (LOVENOX) injection  30 mg Subcutaneous Q24H   insulin aspart  0-5 Units Subcutaneous QHS   insulin aspart  0-9 Units Subcutaneous TID WC   rosuvastatin  20 mg Oral Daily    Continuous Infusions:  promethazine (PHENERGAN) injection (IM or IVPB) Stopped (06/12/23 0851)    PRN Medications:  acetaminophen **OR** acetaminophen (TYLENOL) oral liquid 160 mg/5 mL **OR** acetaminophen, LORazepam, ondansetron (ZOFRAN) IV, polyethylene glycol, promethazine (PHENERGAN) injection (IM or IVPB), senna-docusate, traZODone  Antimicrobials from admission:  Anti-infectives (From admission, onward)    None           Data Reviewed:  I have personally reviewed the following...  CBC: Recent Labs  Lab 06/11/23 2022  WBC 6.3  NEUTROABS 2.6  HGB 10.2*  HCT 32.5*  MCV 98.2  PLT 148*   Basic Metabolic Panel: Recent Labs  Lab  06/11/23 2022 06/13/23 0659  NA 141 140  K 4.5 4.9  CL 113* 113*  CO2 17* 22  GLUCOSE 191* 79  BUN 41* 39*  CREATININE 2.52* 2.27*  CALCIUM 8.8* 8.8*   GFR: Estimated Creatinine Clearance: 19.1 mL/min (A) (by C-G formula based on SCr of 2.27 mg/dL (H)). Liver Function Tests: Recent Labs  Lab 06/11/23 2022  AST 15  ALT 11  ALKPHOS 64  BILITOT 0.7  PROT 8.0  ALBUMIN 3.6   No results for input(s): "LIPASE", "AMYLASE" in the last 168 hours. No results for input(s): "AMMONIA" in the last 168 hours. Coagulation Profile: Recent Labs  Lab 06/11/23 2022  INR 1.1   Cardiac Enzymes: No results for input(s): "CKTOTAL", "CKMB", "CKMBINDEX", "TROPONINI" in the last 168 hours.  BNP (last 3 results) No results for input(s): "PROBNP" in the last 8760 hours. HbA1C: Recent Labs    06/11/23 2022  HGBA1C 5.8*   CBG: Recent Labs  Lab 06/12/23 2124 06/13/23 0801 06/13/23 0826 06/13/23 1138 06/13/23 1633  GLUCAP 109* 58* 89 73 110*   Lipid Profile: Recent Labs    06/12/23 0040  CHOL 194  HDL 53  LDLCALC 130*  TRIG 54  CHOLHDL 3.7   Thyroid Function Tests: No results for input(s): "TSH", "T4TOTAL", "FREET4", "T3FREE", "THYROIDAB" in the last 72 hours. Anemia Panel: No results for input(s): "VITAMINB12", "FOLATE", "FERRITIN", "TIBC", "IRON", "RETICCTPCT" in the last 72 hours. Most Recent Urinalysis On File:     Component Value Date/Time   COLORURINE YELLOW (A) 10/18/2022 1106   APPEARANCEUR CLOUDY (A) 10/18/2022 1106   LABSPEC 1.013 10/18/2022 1106   PHURINE 5.0 10/18/2022 1106   GLUCOSEU NEGATIVE 10/18/2022 1106   HGBUR MODERATE (A) 10/18/2022 1106   BILIRUBINUR NEGATIVE 10/18/2022 1106   KETONESUR NEGATIVE 10/18/2022 1106   PROTEINUR 100 (A) 10/18/2022 1106   NITRITE NEGATIVE 10/18/2022 1106   LEUKOCYTESUR LARGE (A) 10/18/2022 1106   Sepsis Labs: @LABRCNTIP (procalcitonin:4,lacticidven:4) Microbiology: No results found for this or any previous visit (from  the past 240 hour(s)).    Radiology Studies last 3 days: EEG adult  Result Date: 06/13/2023 Charlsie Quest, MD     06/13/2023  2:22 PM Patient Name: Alicia Garner MRN: [id number] Epilepsy Attending: Charlsie Quest Referring Physician/Provider: Hannah Beat, MD Date: 06/13/2023 Duration: 31.21 mins Patient history: 82yo F with acute onset of worsening left-sided weakness with associated slurred speech. EEG to evaluate for seizure Level of alertness: Awake, drowsy AEDs during EEG study: None Technical aspects: This EEG study was done with scalp electrodes positioned according to the 10-20 International system of electrode placement. Electrical activity was reviewed with band pass filter of 1-70Hz , sensitivity of 7 uV/mm, display speed of 40mm/sec with a 60Hz  notched filter applied as appropriate. EEG data were recorded continuously and digitally stored.  Video monitoring was available and reviewed as appropriate. Description: The posterior dominant rhythm consists of 8-9 Hz activity of moderate voltage (25-35 uV) seen predominantly in posterior head regions, symmetric and reactive to eye opening and eye closing. Drowsiness was characterized by attenuation of the posterior background rhythm. Hyperventilation and photic stimulation were not performed.   IMPRESSION: This study is within normal limits. No seizures or epileptiform discharges were seen throughout the recording. A normal interictal EEG does not exclude the diagnosis of epilepsy. Charlsie Quest   MR BRAIN WO CONTRAST  Result Date: 06/12/2023 CLINICAL DATA:  Neuro deficit, acute, stroke suspected. Left-sided weakness. EXAM: MRI HEAD WITHOUT CONTRAST TECHNIQUE: Multiplanar, multiecho pulse sequences of the brain and surrounding structures were obtained without intravenous contrast. COMPARISON:  CT head without contrast 06/11/2023. MR head without contrast 10/11/2022. FINDINGS: Brain: A 6 mm acute nonhemorrhagic infarct is present along the inferior  aspect of the posterior right temporal lobe. No other acute infarct is present. Mild atrophy and moderate diffuse confluent white matter changes are present bilaterally. Extensive susceptibility artifacts are present throughout the basal ganglia and scattered over the convexities bilaterally. Multiple susceptibility foci are again noted within the brainstem and cerebellum. Multiple remote lacunar infarcts are present within the basal ganglia. Remote lacunar infarcts of the thalami are stable. Diffuse white matter changes extend into the brainstem. A remote lacunar infarct is present the right cerebellum. No acute hemorrhage is present. The ventricles are proportionate to the  degree of atrophy. No significant extraaxial fluid collection is present. Vascular: Flow is present in the major intracranial arteries. Skull and upper cervical spine: The craniocervical junction is normal. Upper cervical spine is within normal limits. Marrow signal is unremarkable. Sinuses/Orbits: Insert normal lens replacement IMPRESSION: 1. 6 mm acute nonhemorrhagic infarct along the inferior aspect of the posterior right temporal lobe. 2. Mild atrophy and moderate diffuse confluent white matter disease likely reflects the sequela of chronic microvascular ischemia. 3. Multiple remote lacunar infarcts of the basal ganglia, thalami, and right cerebellum. 4. Extensive susceptibility artifacts throughout the basal ganglia and scattered over the convexities bilaterally. This likely reflects the sequela of chronic hypertension. These results will be called to the ordering clinician or representative by the Radiologist Assistant, and communication documented in the PACS or [employer]. Electronically Signed   By: Marin Roberts M.D.   On: 06/12/2023 15:04   CT HEAD CODE STROKE WO CONTRAST`  Result Date: 06/11/2023 CLINICAL DATA:  Code stroke. EXAM: CT HEAD WITHOUT CONTRAST TECHNIQUE: Contiguous axial images were obtained from the base  of the skull through the vertex without intravenous contrast. RADIATION DOSE REDUCTION: This exam was performed according to the departmental dose-optimization program which includes automated exposure control, adjustment of the mA and/or kV according to patient size and/or use of iterative reconstruction technique. COMPARISON:  10/18/2022 FINDINGS: Brain: No evidence of acute infarction, hemorrhage, mass, mass effect, or midline shift. No hydrocephalus or extra-axial collection. Age related cerebral atrophy. Remote lacunar infarcts throughout the basal ganglia thalami, and pons. Periventricular white matter changes, likely the sequela of chronic small vessel ischemic disease. Vascular: No hyperdense vessel. Skull: Negative for fracture or focal lesion. Sinuses/Orbits: Mucosal thickening in the maxillary sinuses. Status post bilateral lens replacements. Other: Trace fluid in the mastoid air cells. ASPECTS [facility] Stroke Program Early CT Score) - Ganglionic level infarction (caudate, lentiform nuclei, internal capsule, insula, M1-M3 cortex): 7 - Supraganglionic infarction (M4-M6 cortex): 3 Total score (0-10 with 10 being normal): 10 IMPRESSION: 1. No acute intracranial process. ASPECTS is 10. 2. Remote lacunar infarcts throughout the basal ganglia, thalami, and pons. Code stroke imaging results were communicated on 06/11/2023 at 8:13 pm to provider [facility] via telephone, who verbally acknowledged these results. Electronically Signed   By: Wiliam Ke M.D.   On: 06/11/2023 20:13       Time spent: 50 min    Sunnie Nielsen, DO [facility] 06/13/2023, 5:01 PM    Dictation software may have been used to generate the above note. Typos may occur and escape review in typed/dictated notes. Please contact Dr Lyn Hollingshead directly for clarity if needed.  Staff may message me via secure chat in Epic  but this may not receive an immediate response,  please page me for urgent matters!  If 7PM-7AM,  please contact night coverage [email or web address]

## 2023-06-13 NOTE — Inpatient Diabetes Management (Signed)
Inpatient Diabetes Program Recommendations  AACE/ADA: New Consensus Statement on Inpatient Glycemic Control   Target Ranges:  Prepandial:   less than 140 mg/dL      Peak postprandial:   less than 180 mg/dL (1-2 hours)      Critically ill patients:  140 - 180 mg/dL    Latest Reference Range & Units 06/12/23 00:39 06/12/23 08:18 06/12/23 08:47 06/12/23 12:15 06/12/23 16:28 06/12/23 16:31 06/12/23 17:22 06/12/23 21:24  Glucose-Capillary 70 - 99 mg/dL 98 67 (L) 79 80 68 (L) 77 75 109 (H)    Latest Reference Range & Units 06/11/23 20:22  Hemoglobin A1C 4.8 - 5.6 % 5.8 (H)   Review of Glycemic Control  Diabetes history: DM2 Outpatient Diabetes medications: Januvia 25 mg daily Current orders for Inpatient glycemic control: Tradjenta 5 mg daily, Novolog 0-9 units TID with meals, Novolog 0-5 units QHS  Inpatient Diabetes Program Recommendations:    Oral DM medication: Please discontinue Tradjenta as inpatient and may want to discontinue Januvia as an outpatient.  Thanks, Orlando Penner, RN, MSN, CDCES Diabetes Coordinator Inpatient Diabetes Program [phone] (Team Pager from 8am to 5pm)

## 2023-06-13 NOTE — Progress Notes (Signed)
Eeg done 

## 2023-06-14 ENCOUNTER — Inpatient Hospital Stay
Admit: 2023-06-14 | Discharge: 2023-06-14 | Disposition: A | Discharge status: Home / Self Care | Payer: Medicare Other | Attending: Neurology | Admitting: Neurology

## 2023-06-14 ENCOUNTER — Inpatient Hospital Stay (HOSPITAL_COMMUNITY)
Admit: 2023-06-14 | Discharge: 2023-06-14 | Disposition: A | Discharge status: Home / Self Care | Payer: Medicare Other | Attending: Neurology | Admitting: Neurology

## 2023-06-14 DIAGNOSIS — I6389 Other cerebral infarction: Secondary | ICD-10-CM

## 2023-06-14 DIAGNOSIS — R531 Weakness: Secondary | ICD-10-CM | POA: Diagnosis not present

## 2023-06-14 LAB — GLUCOSE, CAPILLARY
Glucose-Capillary: 76 mg/dL (ref 70–99)
Glucose-Capillary: 97 mg/dL (ref 70–99)
Glucose-Capillary: 69 mg/dL — ABNORMAL LOW (ref 70–99)
Glucose-Capillary: 87 mg/dL (ref 70–99)

## 2023-06-14 LAB — ECHOCARDIOGRAM COMPLETE
AR max vel: 2.48 cm2
AV Area VTI: 2.9 cm2
AV Area mean vel: 2.43 cm2
AV Mean grad: 3.2 mmHg
AV Peak grad: 6 mmHg
Ao pk vel: 1.23 m/s
Area-P 1/2: 3.03 cm2
Height: 64 in
MV VTI: 4.56 cm2
S' Lateral: 2.9 cm
Weight: 2589.08 oz

## 2023-06-14 MED ORDER — CLOPIDOGREL BISULFATE 75 MG PO TABS: 75.0000 mg | ORAL_TABLET | Freq: Every day | ORAL | 0 refills | Status: AC

## 2023-06-14 MED ORDER — ROSUVASTATIN CALCIUM 20 MG PO TABS: ORAL_TABLET | ORAL | 0 refills | Status: AC

## 2023-06-14 MED ORDER — LEVETIRACETAM 250 MG PO TABS: 250.0000 mg | ORAL_TABLET | Freq: Two times a day (BID) | ORAL | 0 refills | Status: DC

## 2023-06-14 MED ORDER — LORAZEPAM 2 MG/ML PO CONC
2.0000 mg | Freq: Once | ORAL | 0 refills | Status: AC | PRN
Start: 2023-06-14 — End: ?

## 2023-06-14 NOTE — Discharge Summary (Addendum)
Physician Discharge Summary   Patient: Alicia Garner MRN: [id number]  DOB: [birth year]   Admit:     Date of Admission: 06/11/2023 Admitted from: home   Discharge: Date of discharge: 06/14/23 Disposition: Relative's home Condition at discharge: good  CODE STATUS: DNR     Discharge Physician: Sunnie Nielsen, DO [facility]     PCP: [facility]  Recommendations for Outpatient Follow-up:  Follow up with PCP [facility], [facility] in 1-2 weeks Please obtain labs/tests: consider BMP, CBC in 1-2 weeks Neurology follow up as below  PCP AND OTHER OUTPATIENT PROVIDERS: SEE BELOW FOR SPECIFIC DISCHARGE INSTRUCTIONS PRINTED FOR PATIENT IN ADDITION TO GENERIC AVS PATIENT INFO    Discharge Instructions     Diet general   Complete by: As directed    Discharge instructions   Complete by: As directed    follow-up with [facility] neurology, call for appointment [phone]   Increase activity slowly   Complete by: As directed          Discharge Diagnoses: Principal Problem:   Left-sided weakness Active Problems:   Seizure-like activity ([name])   Type 2 diabetes mellitus with chronic kidney disease, without long-term current use of insulin ([name])   Essential hypertension   Dyslipidemia   Dysarthria       Hospital Course: HPI: Alicia Garner is a 82 y.o. African-American female with medical history significant for CVA with left-sided weakness, essential hypertension, dyslipidemia, type 2 diabetes mellitus, stage IV chronic kidney disease and chronic anemia, who presented to the emergency room with acute onset of worsening left-sided weakness with associated slurred speech that started around 6:35 PM. Per daughter's description which I confirmed today 09/22, pt was sitting in wheelchair watching TV nodded off about 5 mins, woke up calling daughter's name, reaching out for her, then "out of it" locking up her arms, eyes rolled up into her head, making "ah  ah" sounds like the was trying to talk. I asked daughter if she ever fell or passed out, if she seemed alert during this episode, daughter just said "she was out of it." This episode lasted about 2-3 minutes after which she seemed like her normal self except for feeling hot and daughter said she seemed like she was hurting. Daughter was asking patient if she was ok and patient was unable to form words just kept saying "ah ah". Daughter states pt vomited during the episode.  Per EMS the patient was witnessed to have left upper and lower extremity weakness compared to baseline and developed dysarthria.  In the ER her weakness is returned to her baseline but her family thought her slurred speech was still there.   Hospital course / significant events:  09/21: In ED, code stroke protocol, VS and labs unremarkable. CT head showed no acute intracranial abn, showed remote lacunar infarcts throughout the basal ganglia, thalami and pons. Per neuro, proceed w/ stroke workup and due to suspicion about seizure recommendation was for IV Keppra. The patient was given 1 g of IV Keppra.  09/22: unable to complete MRI, pt vomiting anytime lying down flat, despite phenergan, will reattempt this afternoon as well as EEG. Spoke w/ Dr Iver Nestle, neurology, continue antiepileptics pending EEG/MRI  09/23: Echo still pending. EEG done. Per neuro, continue antiepileptics.  09/24: Echo no LA dilation or thrombus, otherwise no concerns other than grade 1 diast df.   Consultants:  Neurology   Procedures: none      ASSESSMENT & PLAN:   Left-sided  weakness, likely acute CVA vs new seizure, see below  Acute CVA History CVA w/ residual L weakness Echocardiogram pending - LA normal size, do not need cardiac monitor  ASA, Plavix, statin - per neurology DAPT x21d then ASA alone  Outpatient neurology follow up    Seizure-like activity, true seizure not confirmed  Keppra 500 mg bid maintenance  Ativan liquid prn  seizure Outpatient neurology follow up    Type 2 diabetes mellitus with chronic kidney disease, without long-term current use of insulin ([name]) Hypoglycemia  D/c tradjenta    Essential hypertension Continue home meds    Dyslipidemia statin           Discharge Instructions  Allergies as of 06/14/2023       Reactions   Lisinopril Swelling        Medication List     STOP taking these medications    atenolol 25 MG tablet Commonly known as: TENORMIN   linagliptin 5 MG Tabs tablet Commonly known as: TRADJENTA       TAKE these medications    acetaminophen 650 MG CR tablet Commonly known as: TYLENOL Take 650 mg by mouth every 8 (eight) hours as needed.   amLODipine 10 MG tablet Commonly known as: NORVASC Take 10 mg by mouth daily.   aspirin EC 81 MG tablet Take 81 mg by mouth daily. Swallow whole.   Cholecalciferol 25 MCG (1000 UT) capsule Take by mouth.   clopidogrel 75 MG tablet Commonly known as: PLAVIX Take 1 tablet (75 mg total) by mouth daily.   cyanocobalamin 1000 MCG tablet Take 1 tablet (1,000 mcg total) by mouth daily.   diclofenac Sodium 1 % Gel Commonly known as: VOLTAREN Apply 2 g topically 4 (four) times daily.   levETIRAcetam 250 MG tablet Commonly known as: KEPPRA Take 1 tablet (250 mg total) by mouth 2 (two) times daily.   LORazepam 2 MG/ML concentrated solution Commonly known as: LORazepam Intensol Take 1 mL (2 mg total) by mouth once as needed for up to 1 dose for seizure.   polyethylene glycol 17 g packet Commonly known as: MIRALAX / GLYCOLAX Take 17 g by mouth daily as needed for moderate constipation.   MiraLax 17 g packet Generic drug: polyethylene glycol Take 17 g by mouth daily as needed.   potassium chloride SA 20 MEQ tablet Commonly known as: KLOR-CON M Take 20 mEq by mouth 2 (two) times daily.   rosuvastatin 20 MG tablet Commonly known as: CRESTOR Take 0.5 tablets (10 mg total) by mouth daily for 14 days,  THEN 1 tablet (20 mg total) daily for 14 days. Start taking on: June 15, 2023 What changed:  medication strength See the new instructions.   sitaGLIPtin 25 MG tablet Commonly known as: JANUVIA Take 25 mg by mouth daily.          Allergies  Allergen Reactions   Lisinopril Swelling     Subjective: pt reports feeling well today, no concerns, denies CP/SOB, denies new weakness or headache    Discharge Exam: BP (!) 149/81 (BP Location: Right Arm)   Pulse 76   Temp 98.1 F (36.7 C) (Oral)   Resp 19   Ht 5\' 4"  (1.626 m)   Wt 73.4 kg   LMP  (LMP Unknown)   SpO2 100%   BMI 27.78 kg/m  General: Pt is alert, awake, not in acute distress Cardiovascular: RRR, S1/S2, no rubs, no gallops Respiratory: CTA bilaterally, no wheezing, no rhonchi Abdominal: Soft, NT, ND, bowel  sounds +WNL Extremities: no edema, no cyanosis     The results of significant diagnostics from this hospitalization (including imaging, microbiology, ancillary and laboratory) are listed below for reference.     Microbiology: No results found for this or any previous visit (from the past 240 hour(s)).   Labs: BNP (last 3 results) Recent Labs    10/18/22 0954  BNP 37.6   Basic Metabolic Panel: Recent Labs  Lab 06/11/23 2022 06/13/23 0659  NA 141 140  K 4.5 4.9  CL 113* 113*  CO2 17* 22  GLUCOSE 191* 79  BUN 41* 39*  CREATININE 2.52* 2.27*  CALCIUM 8.8* 8.8*   Liver Function Tests: Recent Labs  Lab 06/11/23 2022  AST 15  ALT 11  ALKPHOS 64  BILITOT 0.7  PROT 8.0  ALBUMIN 3.6   No results for input(s): "LIPASE", "AMYLASE" in the last 168 hours. No results for input(s): "AMMONIA" in the last 168 hours. CBC: Recent Labs  Lab 06/11/23 2022  WBC 6.3  NEUTROABS 2.6  HGB 10.2*  HCT 32.5*  MCV 98.2  PLT 148*   Cardiac Enzymes: No results for input(s): "CKTOTAL", "CKMB", "CKMBINDEX", "TROPONINI" in the last 168 hours. BNP: Invalid input(s): "POCBNP" CBG: Recent Labs   Lab 06/13/23 2030 06/14/23 0816 06/14/23 1149 06/14/23 1611 06/14/23 1640  GLUCAP 119* 76 97 69* 87   D-Dimer No results for input(s): "DDIMER" in the last 72 hours. Hgb A1c Recent Labs    06/11/23 2022  HGBA1C 5.8*   Lipid Profile Recent Labs    06/12/23 0040  CHOL 194  HDL 53  LDLCALC 130*  TRIG 54  CHOLHDL 3.7   Thyroid function studies No results for input(s): "TSH", "T4TOTAL", "T3FREE", "THYROIDAB" in the last 72 hours.  Invalid input(s): "FREET3" Anemia work up No results for input(s): "VITAMINB12", "FOLATE", "FERRITIN", "TIBC", "IRON", "RETICCTPCT" in the last 72 hours. Urinalysis    Component Value Date/Time   COLORURINE YELLOW (A) 10/18/2022 1106   APPEARANCEUR CLOUDY (A) 10/18/2022 1106   LABSPEC 1.013 10/18/2022 1106   PHURINE 5.0 10/18/2022 1106   GLUCOSEU NEGATIVE 10/18/2022 1106   HGBUR MODERATE (A) 10/18/2022 1106   BILIRUBINUR NEGATIVE 10/18/2022 1106   KETONESUR NEGATIVE 10/18/2022 1106   PROTEINUR 100 (A) 10/18/2022 1106   NITRITE NEGATIVE 10/18/2022 1106   LEUKOCYTESUR LARGE (A) 10/18/2022 1106   Sepsis Labs Recent Labs  Lab 06/11/23 2022  WBC 6.3   Microbiology No results found for this or any previous visit (from the past 240 hour(s)). Imaging No results found.    Time coordinating discharge: over 30 minutes  SIGNED:  Sunnie Nielsen DO [facility]

## 2023-06-14 NOTE — Progress Notes (Signed)
*  PRELIMINARY RESULTS* Echocardiogram 2D Echocardiogram has been performed.  Carolyne Fiscal 06/14/2023, 2:21 PM

## 2023-06-14 NOTE — Progress Notes (Signed)
Patient discharged home, all belongings sent home with patient. IV removed. Discharge education and teaching provided to patient and daughter, all questions answered.

## 2023-06-14 NOTE — Plan of Care (Signed)

## 2023-09-06 ENCOUNTER — Emergency Department
Admission: EM | Admit: 2023-09-06 | Discharge: 2023-09-07 | Disposition: A | Discharge status: Home / Self Care | Attending: Emergency Medicine | Admitting: Emergency Medicine

## 2023-09-06 ENCOUNTER — Emergency Department

## 2023-09-06 ENCOUNTER — Encounter: Payer: Self-pay | Admitting: Emergency Medicine

## 2023-09-06 ENCOUNTER — Other Ambulatory Visit: Payer: Self-pay

## 2023-09-06 DIAGNOSIS — I69354 Hemiplegia and hemiparesis following cerebral infarction affecting left non-dominant side: Secondary | ICD-10-CM | POA: Diagnosis not present

## 2023-09-06 DIAGNOSIS — R569 Unspecified convulsions: Secondary | ICD-10-CM | POA: Insufficient documentation

## 2023-09-06 DIAGNOSIS — E785 Hyperlipidemia, unspecified: Secondary | ICD-10-CM | POA: Diagnosis not present

## 2023-09-06 DIAGNOSIS — E119 Type 2 diabetes mellitus without complications: Secondary | ICD-10-CM | POA: Insufficient documentation

## 2023-09-06 DIAGNOSIS — R4182 Altered mental status, unspecified: Secondary | ICD-10-CM | POA: Diagnosis not present

## 2023-09-06 DIAGNOSIS — Z993 Dependence on wheelchair: Secondary | ICD-10-CM | POA: Insufficient documentation

## 2023-09-06 DIAGNOSIS — I10 Essential (primary) hypertension: Secondary | ICD-10-CM | POA: Insufficient documentation

## 2023-09-06 DIAGNOSIS — R059 Cough, unspecified: Secondary | ICD-10-CM | POA: Diagnosis not present

## 2023-09-06 DIAGNOSIS — Z8673 Personal history of transient ischemic attack (TIA), and cerebral infarction without residual deficits: Secondary | ICD-10-CM | POA: Insufficient documentation

## 2023-09-06 DIAGNOSIS — R5383 Other fatigue: Secondary | ICD-10-CM | POA: Insufficient documentation

## 2023-09-06 DIAGNOSIS — R258 Other abnormal involuntary movements: Secondary | ICD-10-CM | POA: Insufficient documentation

## 2023-09-06 DIAGNOSIS — I6782 Cerebral ischemia: Secondary | ICD-10-CM | POA: Insufficient documentation

## 2023-09-06 DIAGNOSIS — Z7401 Bed confinement status: Secondary | ICD-10-CM | POA: Diagnosis not present

## 2023-09-06 DIAGNOSIS — Y900 Blood alcohol level of less than 20 mg/100 ml: Secondary | ICD-10-CM | POA: Insufficient documentation

## 2023-09-06 LAB — COMPREHENSIVE METABOLIC PANEL
ALT: 10 U/L (ref 0–44)
AST: 13 U/L — ABNORMAL LOW (ref 15–41)
Albumin: 3.5 g/dL (ref 3.5–5.0)
Alkaline Phosphatase: 62 U/L (ref 38–126)
Anion gap: 10 (ref 5–15)
BUN: 37 mg/dL — ABNORMAL HIGH (ref 8–23)
CO2: 19 mmol/L — ABNORMAL LOW (ref 22–32)
Calcium: 9.1 mg/dL (ref 8.9–10.3)
Chloride: 107 mmol/L (ref 98–111)
Creatinine, Ser: 2.33 mg/dL — ABNORMAL HIGH (ref 0.44–1.00)
GFR, Estimated: 21 mL/min — ABNORMAL LOW (ref >60)
Glucose, Bld: 168 mg/dL — ABNORMAL HIGH (ref 70–99)
Potassium: 4.5 mmol/L (ref 3.5–5.1)
Sodium: 136 mmol/L (ref 135–145)
Total Bilirubin: 0.5 mg/dL (ref <1.2)
Total Protein: 7.6 g/dL (ref 6.5–8.1)

## 2023-09-06 LAB — CBC
HCT: 33.4 % — ABNORMAL LOW (ref 36.0–46.0)
Hemoglobin: 10.6 g/dL — ABNORMAL LOW (ref 12.0–15.0)
MCH: 30.9 pg (ref 26.0–34.0)
MCHC: 31.7 g/dL (ref 30.0–36.0)
MCV: 97.4 fL (ref 80.0–100.0)
Platelets: 150 10*3/uL (ref 150–400)
RBC: 3.43 MIL/uL — ABNORMAL LOW (ref 3.87–5.11)
RDW: 14.1 % (ref 11.5–15.5)
WBC: 4.5 10*3/uL (ref 4.0–10.5)
nRBC: 0 % (ref 0.0–0.2)

## 2023-09-06 LAB — PROTIME-INR
INR: 1.1 (ref 0.8–1.2)
Prothrombin Time: 14 s (ref 11.4–15.2)

## 2023-09-06 LAB — APTT: aPTT: 29 s (ref 24–36)

## 2023-09-06 LAB — DIFFERENTIAL
Abs Immature Granulocytes: 0.02 10*3/uL (ref 0.00–0.07)
Basophils Absolute: 0 10*3/uL (ref 0.0–0.1)
Basophils Relative: 0 %
Eosinophils Absolute: 0 10*3/uL (ref 0.0–0.5)
Eosinophils Relative: 0 %
Immature Granulocytes: 0 %
Lymphocytes Relative: 17 %
Lymphs Abs: 0.8 10*3/uL (ref 0.7–4.0)
Monocytes Absolute: 0.3 10*3/uL (ref 0.1–1.0)
Monocytes Relative: 6 %
Neutro Abs: 3.4 10*3/uL (ref 1.7–7.7)
Neutrophils Relative %: 77 %

## 2023-09-06 LAB — ETHANOL: Alcohol, Ethyl (B): <10 mg/dL (ref <10)

## 2023-09-06 MED ORDER — LEVETIRACETAM IN NACL 1000 MG/100ML IV SOLN
1000.0000 mg | Freq: Once | INTRAVENOUS | Status: AC
  Administered 2023-09-07: 1000 mg via INTRAVENOUS
  Filled 2023-09-06: qty 100

## 2023-09-06 MED ORDER — LEVETIRACETAM 250 MG PO TABS: 250.0000 mg | ORAL_TABLET | Freq: Two times a day (BID) | ORAL | 1 refills | Status: AC

## 2023-09-06 MED ORDER — SODIUM CHLORIDE 0.9% FLUSH: 3.0000 mL | Freq: Once | INTRAVENOUS | Status: DC

## 2023-09-06 NOTE — ED Triage Notes (Addendum)
Patient to ED via [facility] from home for AMS. Patient only alert to self at this time. Per daughter in EMS report, pt had seizure like activity at home and left sided weakness. LWK last night at 1900. VAN negative in triage.

## 2023-09-06 NOTE — ED Provider Notes (Signed)
[facility] Provider Note    Event Date/Time   First MD Initiated Contact with Patient 09/06/23 2313     (approximate)   History   Altered Mental Status   HPI  Alicia Garner is a 82 y.o. female   Past medical history of prior stroke with residual left-sided deficits, diabetes, hypertension hyperlipidemia presents to the emergency department with seizure-like activity occurred yesterday afternoon and then subsequently 1 day of fatigue, altered mental status, dry cough, poor p.o. intake.  Of note a similar occurrence happened in September leading to hospitalization and got a stroke workup showing remote lacunar infarcts throughout the basal ganglia and was started on Keppra due to suspicion about seizure.  Her MRI ended up showing a 6 mm acute nonhemorrhagic infarct on the right posterior temporal lobe.  She was discharged on Keppra and was supposed to follow-up with neurology but never followed up.  They have taken Keppra for approximately 3 months but had just stopped within the last 1 to 2 weeks.  That hospitalization was prompted by a similar occurrence to today where she had some seizure-like activity witnessed by daughter.  The daughter explains that yesterday this patient was sitting watching TV and started some rhythmic contractions of her upper extremities, foaming at the mouth, but was able to look at daughter and interact to some degree during the episode.  Lasted about 3 minutes.  Afterwards had some confusion, slow to respond.  This got dramatically better but throughout the last 1 day seems more fatigued than usual.  She is typically bedbound and requires wheelchair to get around.  She is typically more conversant.  She has had poor p.o. intake over the last 24 hours.  She has had a dry cough.  She denies abdominal pain or urinary symptoms.  There was no trauma sustained during this episode.  She has some mild left-sided motor weakness from prior stroke.   Neurologically is at baseline currently, per daughter.   Independent Historian contributed to assessment above: Daughter at bedside corroborates information above  External Medical Documents Reviewed: Discharge summary from 06/14/2023 for seizure-like activity and stroke workup      Physical Exam   Triage Vital Signs: ED Triage Vitals  Encounter Vitals Group     BP 09/06/23 1656 121/71     Systolic BP Percentile --      Diastolic BP Percentile --      Pulse Rate 09/06/23 1656 79     Resp 09/06/23 1656 18     Temp 09/06/23 1656 97.9 F (36.6 C)     Temp Source 09/06/23 1656 Oral     SpO2 09/06/23 1656 97 %     Weight 09/06/23 1657 160 lb 15 oz (73 kg)     Height 09/06/23 1657 5\' 4"  (1.626 m)     Head Circumference --      Peak Flow --      Pain Score 09/06/23 1657 0     Pain Loc --      Pain Education --      Exclude from Growth Chart --     Most recent vital signs: Vitals:   09/06/23 1656 09/06/23 2247  BP: 121/71 137/86  Pulse: 79 80  Resp: 18 16  Temp: 97.9 F (36.6 C) 98.4 F (36.9 C)  SpO2: 97% 99%    General: Awake, no distress.  CV:  Good peripheral perfusion.  Resp:  Normal effort. Abd:  No distention.  Other:  Awake  alert oriented, slow to respond but answers questions appropriately.  Moving all extremities with full active range of motion.  No facial asymmetry.  No motor or sensory deficits.  Lungs are clear.  Abdomen is soft and nontender.   ED Results / Procedures / Treatments   Labs (all labs ordered are listed, but only abnormal results are displayed) Labs Reviewed  CBC - Abnormal; Notable for the following components:      Result Value   RBC 3.43 (*)    Hemoglobin 10.6 (*)    HCT 33.4 (*)    All other components within normal limits  COMPREHENSIVE METABOLIC PANEL - Abnormal; Notable for the following components:   CO2 19 (*)    Glucose, Bld 168 (*)    BUN 37 (*)    Creatinine, Ser 2.33 (*)    AST 13 (*)    GFR, Estimated 21 (*)    All  other components within normal limits  PROTIME-INR  APTT  DIFFERENTIAL  ETHANOL  URINALYSIS, ROUTINE W REFLEX MICROSCOPIC  LEVETIRACETAM LEVEL  CBG MONITORING, ED     I ordered and reviewed the above labs they are notable for she is a creatinine of 2.33, similar to baseline.  EKG  ED ECG REPORT I, Pilar Jarvis, the attending physician, personally viewed and interpreted this ECG.   Date: 09/06/2023  EKG Time: 1658  Rate: 77  Rhythm: sinus  Axis: nl  Intervals:lafb  ST&T Change: no stemi    RADIOLOGY I independently reviewed and interpreted CT scan of the head see no obvious bleeding or midline shift I also reviewed radiologist's formal read.   PROCEDURES:  Critical Care performed: No  Procedures   MEDICATIONS ORDERED IN ED: Medications  sodium chloride flush (NS) 0.9 % injection 3 mL (has no administration in time range)  levETIRAcetam (KEPPRA) IVPB 1000 mg/100 mL premix (has no administration in time range)    IMPRESSION / MDM / ASSESSMENT AND PLAN / ED COURSE  I reviewed the triage vital signs and the nursing notes.                                Patient's presentation is most consistent with acute presentation with potential threat to life or bodily function.  Differential diagnosis includes, but is not limited to, seizure, metabolic derangement, metabolic encephalopathy, infection, CVA or ICH   The patient is on the cardiac monitor to evaluate for evidence of arrhythmia and/or significant heart rate changes.  MDM:    This is a patient who exhibited seizure-like activity yesterday who had similar symptoms prompting hospitalization in September and had just completed her Keppra and did not have refills, has been off of this medication for 1 week prior to the onset of symptoms.  Currently she is neurologically intact, neurologically at baseline, and oriented however continues to have fatigue, poor p.o. intake, and complains of a dry cough and a single  episode of dysuria to her daughter yesterday.  Labs unremarkable, will complete infectious workup with chest x-ray as well as urinalysis.  I considered stroke but unlikely in this patient with no acute neurologic deficits.  I considered seizure as well and will Keppra load her and start her back on her 250 twice daily Keppra and have her follow-up outpatient with neurology.  I considered CVA but I do not think likely given no focal neurologic deficits currently.  I anticipate she can be discharged home with Keppra  and neurology follow-up, pending UA/chest x-ray and Keppra load in the ED.       FINAL CLINICAL IMPRESSION(S) / ED DIAGNOSES   Final diagnoses:  Seizure-like activity ([name])     Rx / DC Orders   ED Discharge Orders          Ordered    levETIRAcetam (KEPPRA) 250 MG tablet  2 times daily        09/06/23 2347             Note:  This document was prepared using Dragon voice recognition software and may include unintentional dictation errors.    Pilar Jarvis, MD 09/06/23 [phone]

## 2023-09-06 NOTE — ED Provider Triage Note (Signed)
Emergency Medicine Provider Triage Evaluation Note  Alicia Garner , a 82 y.o. female  was evaluated in triage.  Pt complains of AMS, last known well was 1900 last night. Daughter found her to be AMS. Patient is only oriented to self.   Review of Systems  Positive:  Negative:   Physical Exam  LMP  (LMP Unknown)  Gen:   Awake, no distress   Resp:  Normal effort  MSK:   Moves extremities without difficulty  Other:  Patient does have some left sided weakness, VAN is negative  Medical Decision Making  Medically screening exam initiated at 4:55 PM.  Appropriate orders placed.  Alicia Garner was informed that the remainder of the evaluation will be completed by another provider, this initial triage assessment does not replace that evaluation, and the importance of remaining in the ED until their evaluation is complete.    Cameron Ali, PA-C 09/06/23 1703

## 2023-09-06 NOTE — ED Triage Notes (Signed)
Pt comes via EMS from home. Pt LKW was 1900 last night. Daughter found pt AMS. Pt then later today about hour ago had seizure like symptoms and some left sided weakness.   EMS did not see any weakness with pt. Pt was altered with EMS. Pt maybe A*OX1.  Pt did have this happen 6 months ago and had dx of small brain bleed.  Pt is on thinners and stopped them 3 months ago.  86 98% RA 146/77 CBG 177 98.8 temp

## 2023-09-07 LAB — URINALYSIS, ROUTINE W REFLEX MICROSCOPIC
Bacteria, UA: NONE SEEN
Bilirubin Urine: NEGATIVE
Glucose, UA: NEGATIVE mg/dL
Hgb urine dipstick: NEGATIVE
Ketones, ur: NEGATIVE mg/dL
Leukocytes,Ua: NEGATIVE
Nitrite: NEGATIVE
Protein, ur: 100 mg/dL — AB
Specific Gravity, Urine: 1.014 (ref 1.005–1.030)
pH: 5 (ref 5.0–8.0)

## 2023-09-07 NOTE — Progress Notes (Signed)
ED staff able to establish PIV access.

## 2023-09-07 NOTE — ED Notes (Signed)
IV failed attempt x2

## 2023-09-07 NOTE — Discharge Instructions (Signed)
Take the Keppra as prescribed and call Dr. Malvin Johns who is a neurologist for an appointment.  Thank you for choosing [place] for your health care today!  Please see your primary doctor this week for a follow up appointment.   If you have any new, worsening, or unexpected symptoms call your doctor right away or come back to the emergency department for reevaluation.  It was my pleasure to care for you today.   Daneil Dan Modesto Charon, MD

## 2023-09-08 LAB — LEVETIRACETAM LEVEL: Levetiracetam Lvl: <2 ug/mL — ABNORMAL LOW (ref 10.0–40.0)

## 2023-09-27 IMAGING — US US ABDOMEN LIMITED
1 series · 14 of 25 positions shown · non-contrast
Comparison: None.

CLINICAL DATA: Elevated LFTs

EXAM:
ULTRASOUND ABDOMEN LIMITED RIGHT UPPER QUADRANT

[Series 1: us abdomen limited ruq (liver/gb) · 14 of 34 slices shown]
[im 1/34]
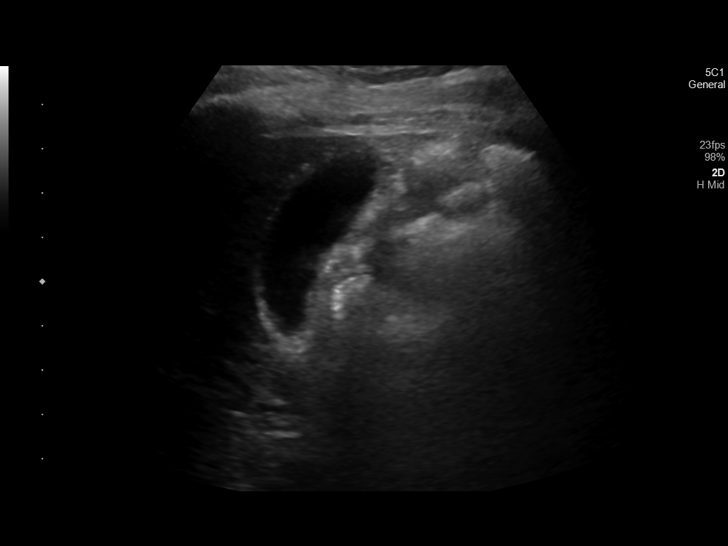
[im 3/34]
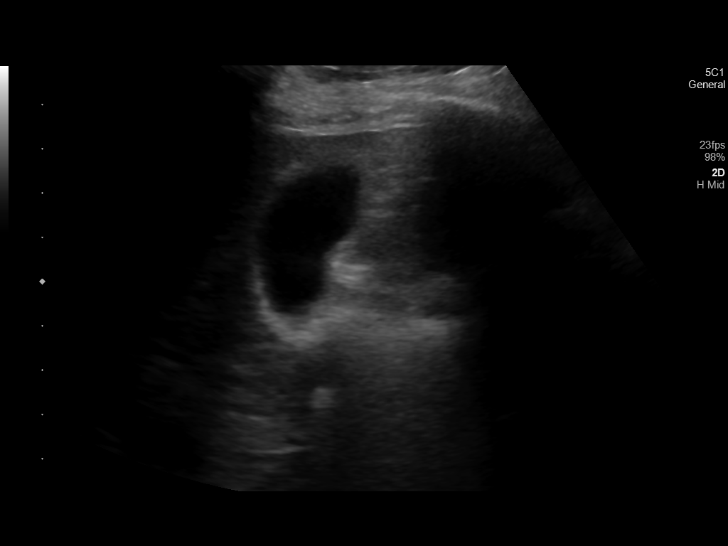
[im 6/34]
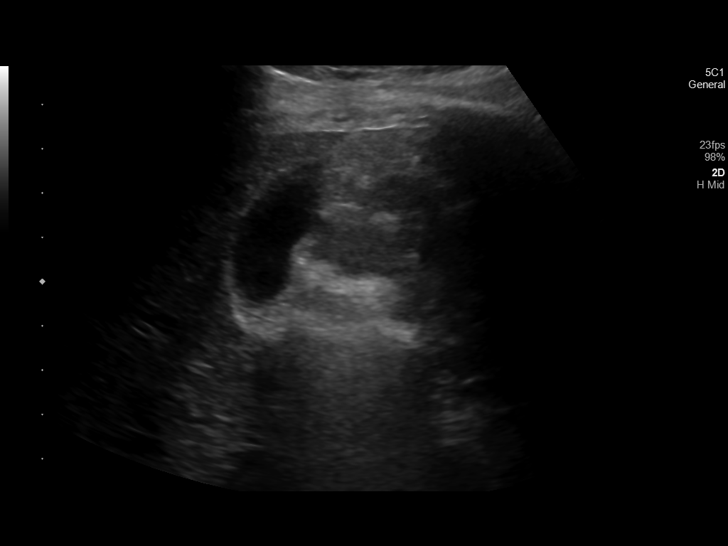
[im 9/34]
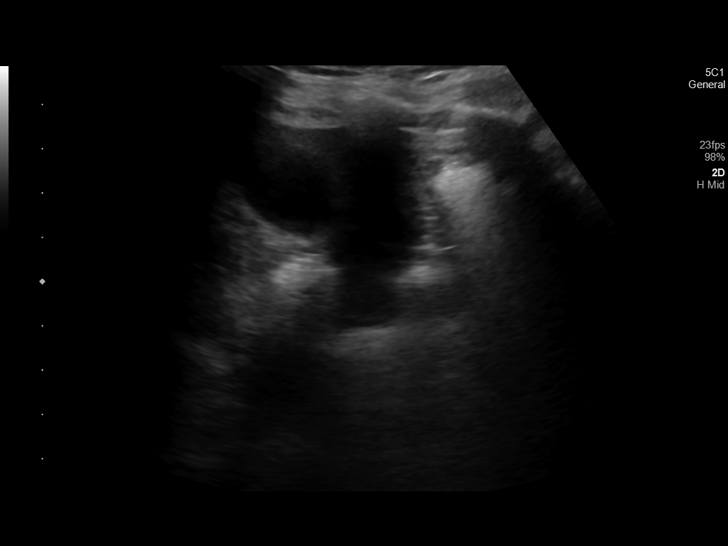
[im 12/34]
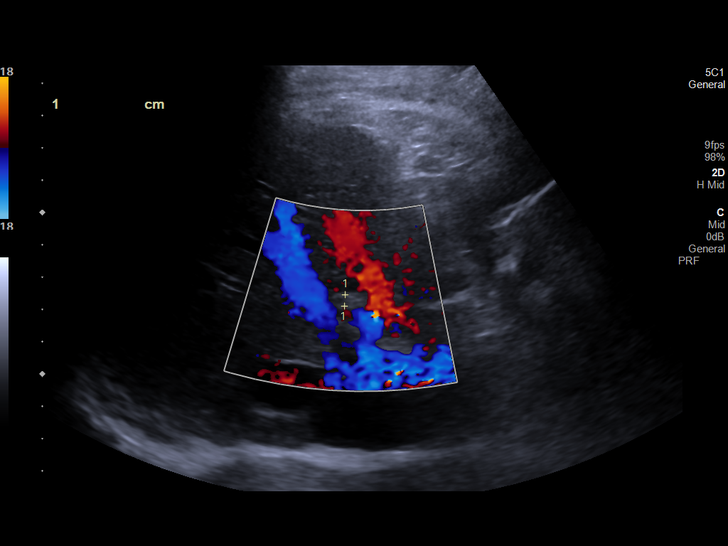
[im 13/34]
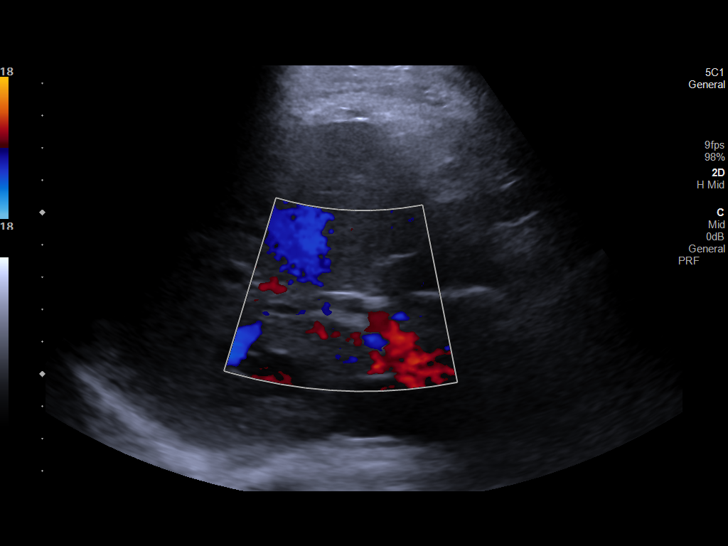
[im 16/34]
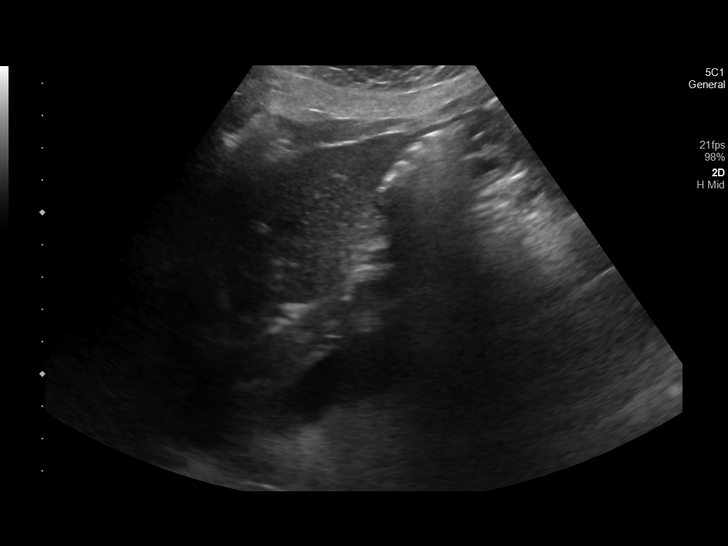
[im 18/34]
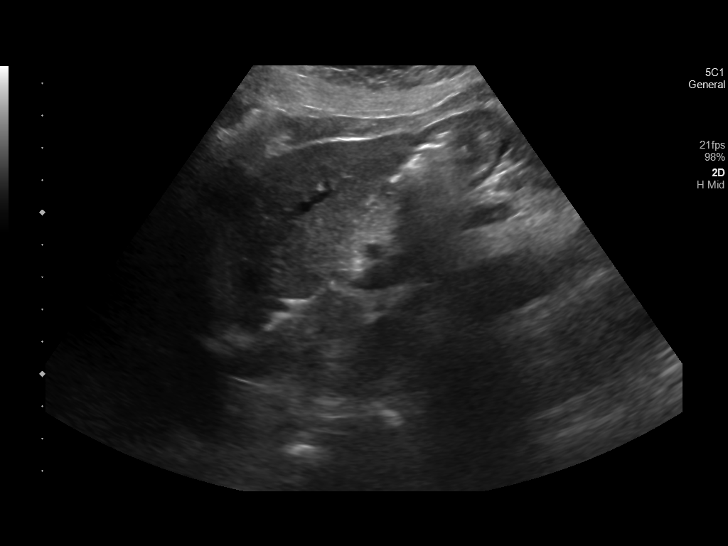
[im 21/34]
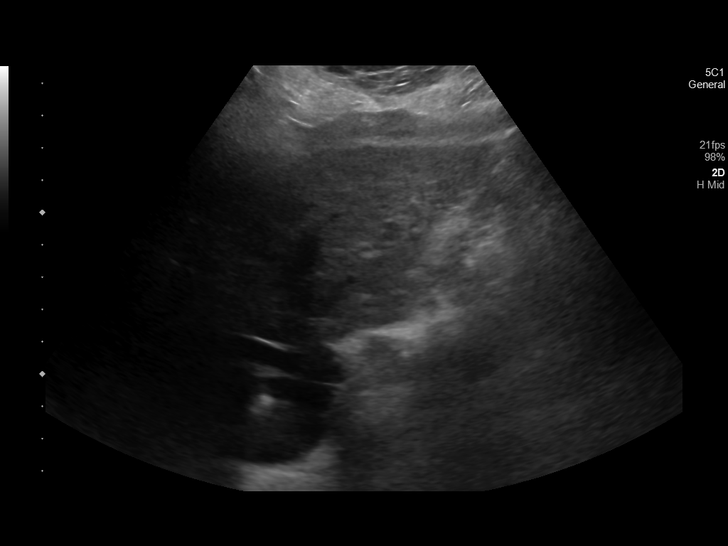
[im 23/34]
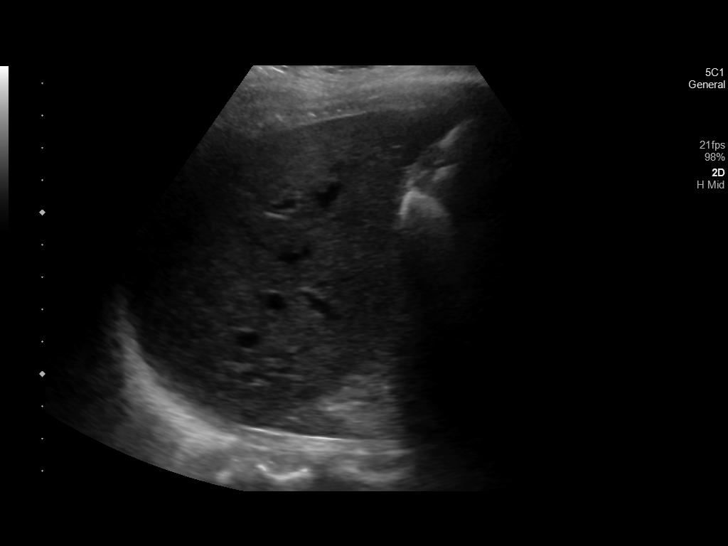
[im 25/34]
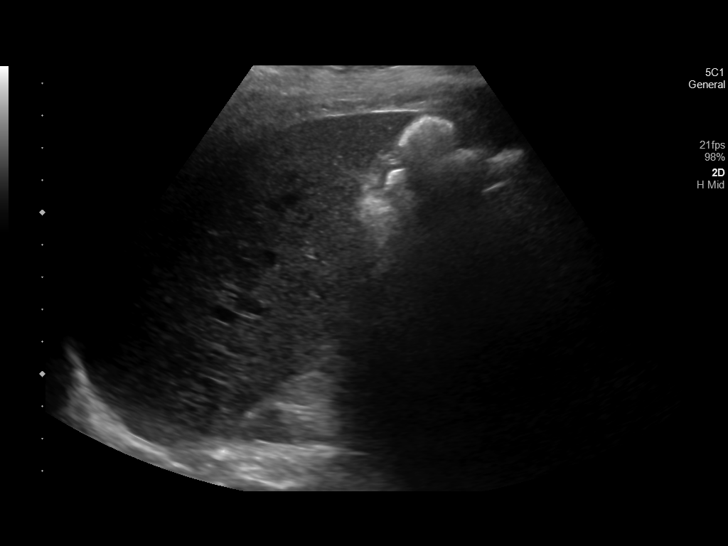
[im 28/34]
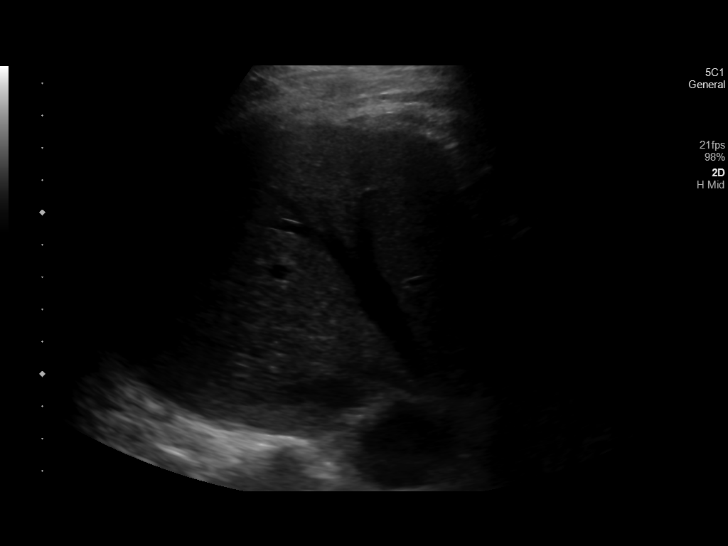
[im 31/34]
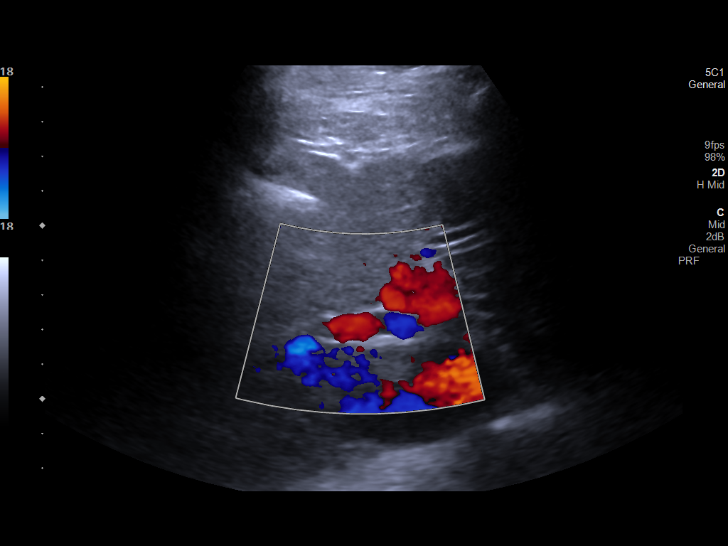
[im 34/34]
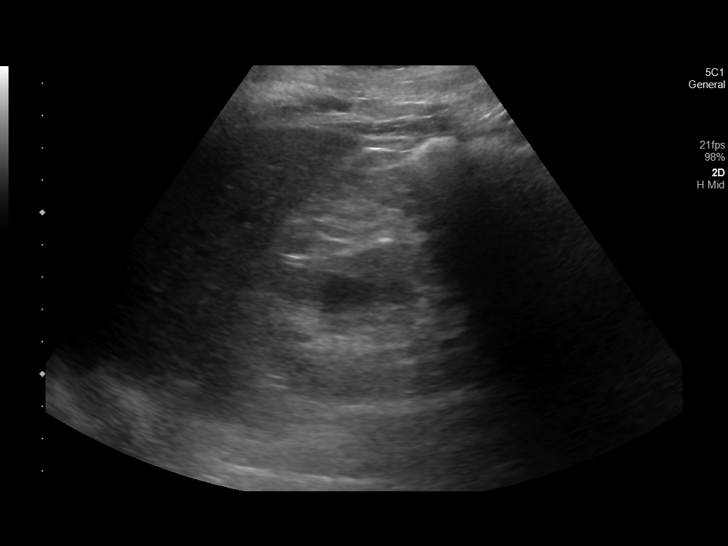

[14 of 25 positions shown; findings below may reference images not displayed]

FINDINGS: Gallbladder:

No gallstones or wall thickening visualized. No sonographic Murphy
sign noted by sonographer.

Common bile duct:

Diameter: 3.5 mm

Liver:

No focal lesion identified. Increased parenchymal echogenicity.
Portal vein is patent on color Doppler imaging with normal direction
of blood flow towards the liver.

Other: None.
IMPRESSION: 1. No acute abnormality.
2. Increased echogenicity of the liver suggesting fatty
infiltration.

## 2023-09-28 IMAGING — US US EXTREM LOW VENOUS
1 series · 13 of 24 positions shown · non-contrast
Comparison: None.

CLINICAL DATA: Lower extremity swelling



[Series 1: us venous img lower bilat (dvt) · portal-venous · 13 of 58 slices shown]
[im 1/58]
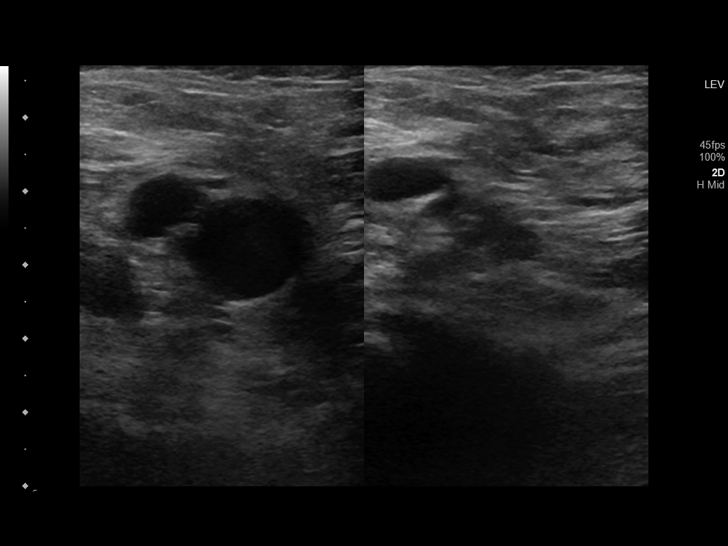
[im 5/58]
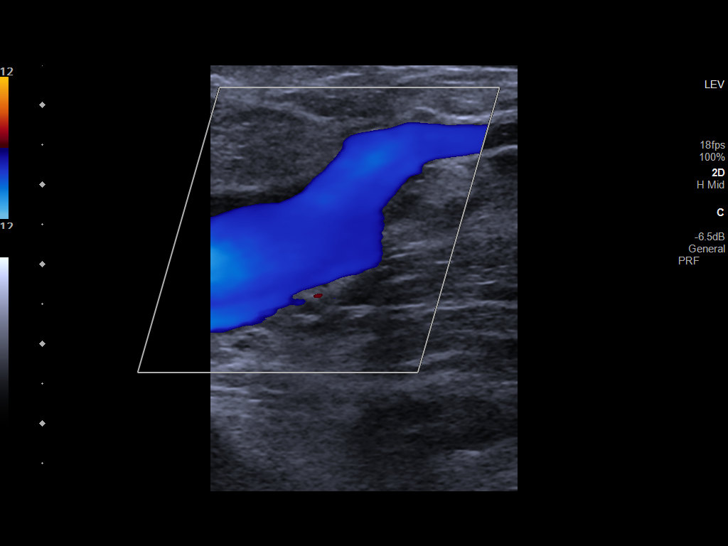
[im 10/58]
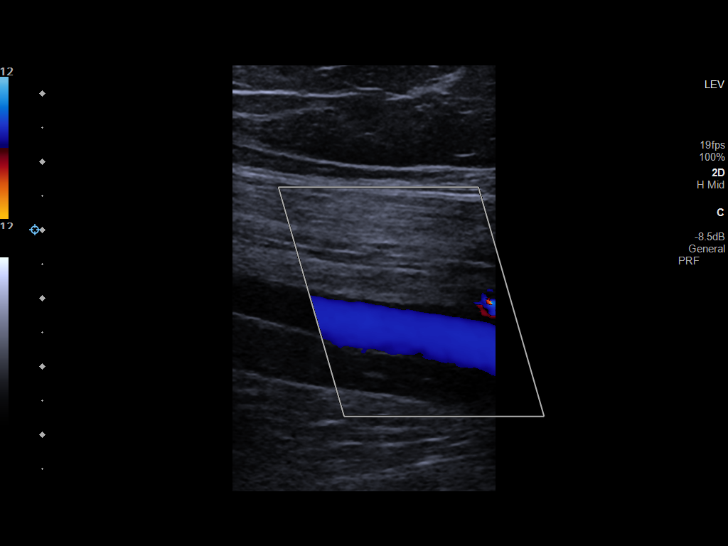
[im 15/58]
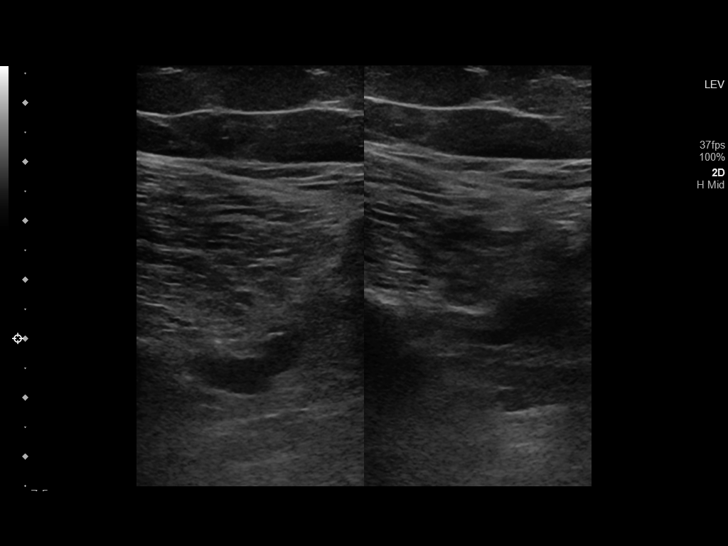
[im 20/58]
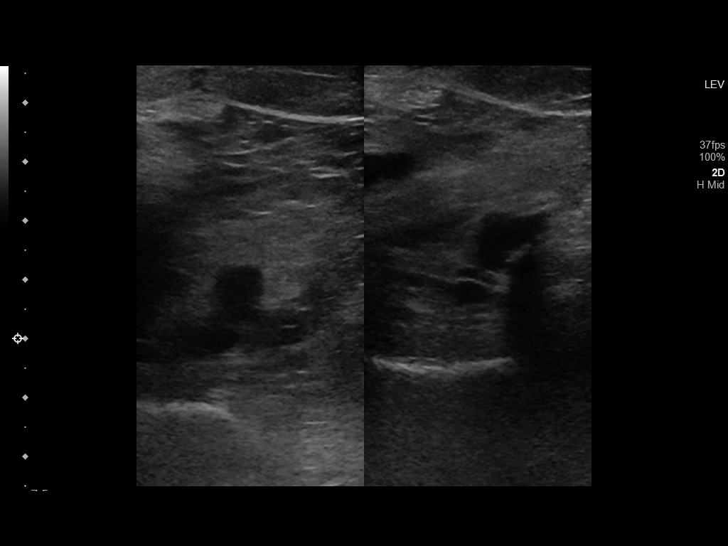
[im 25/58]
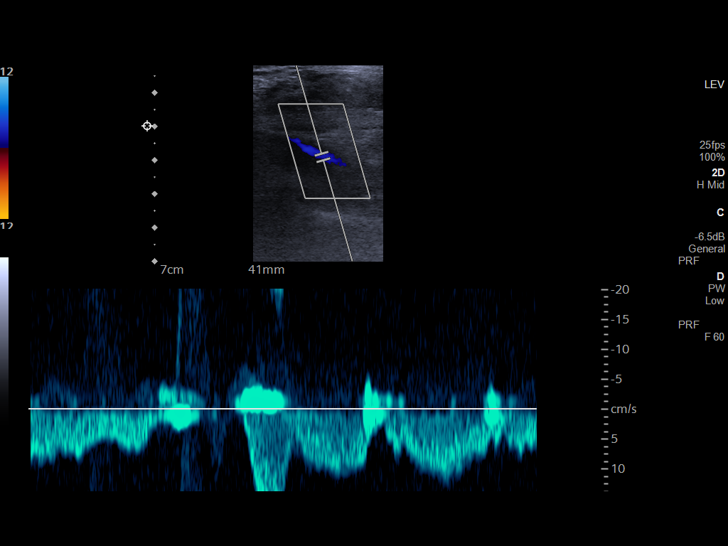
[im 30/58]
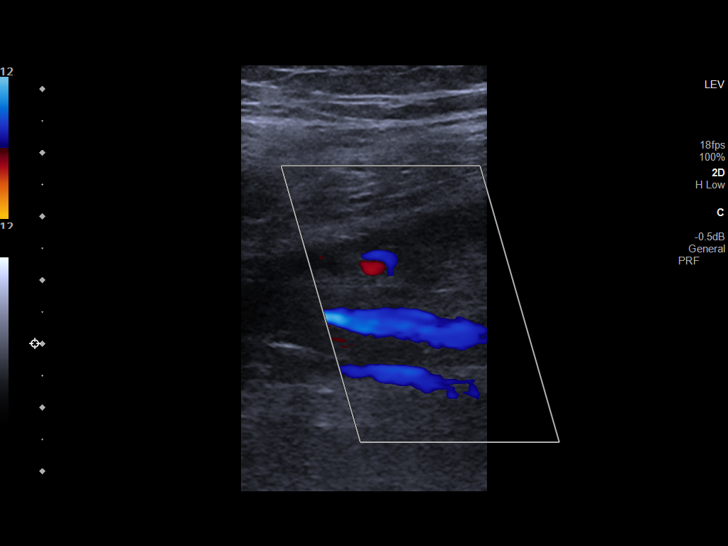
[im 33/58]
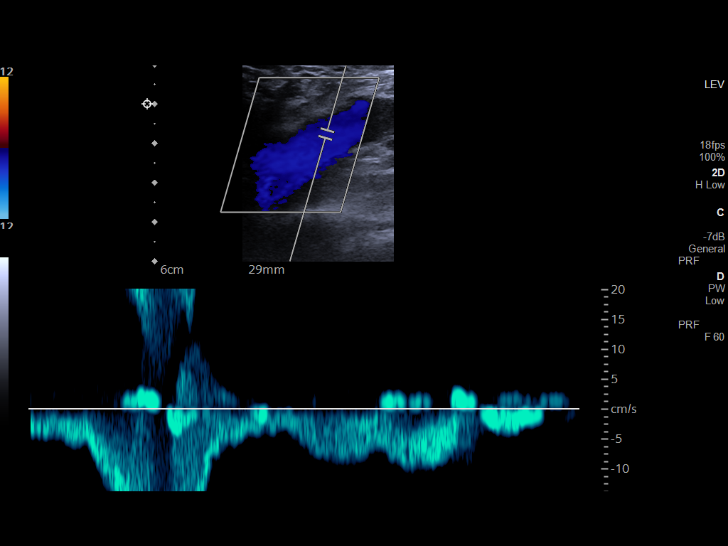
[im 38/58]
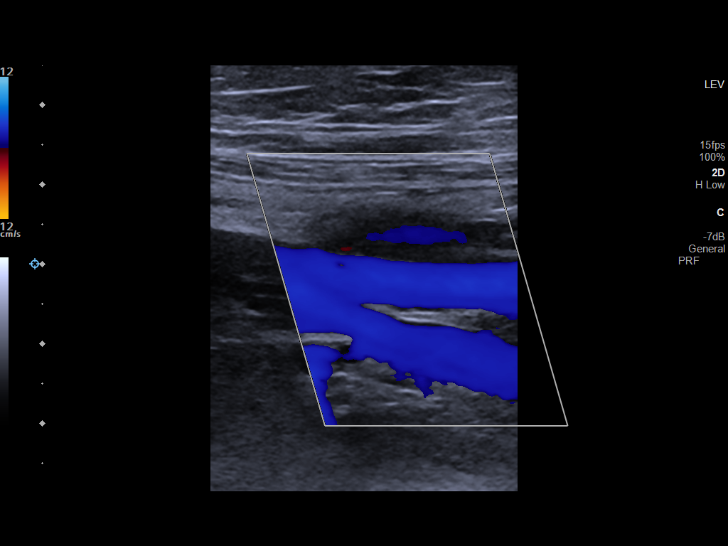
[im 43/58]
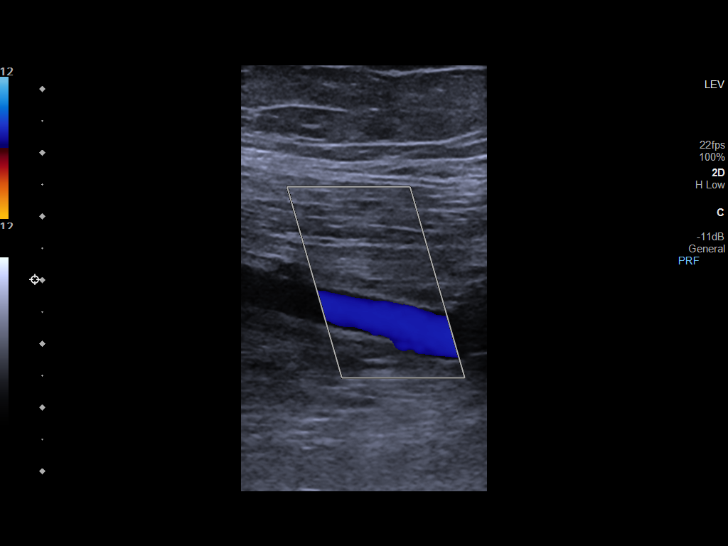
[im 48/58]
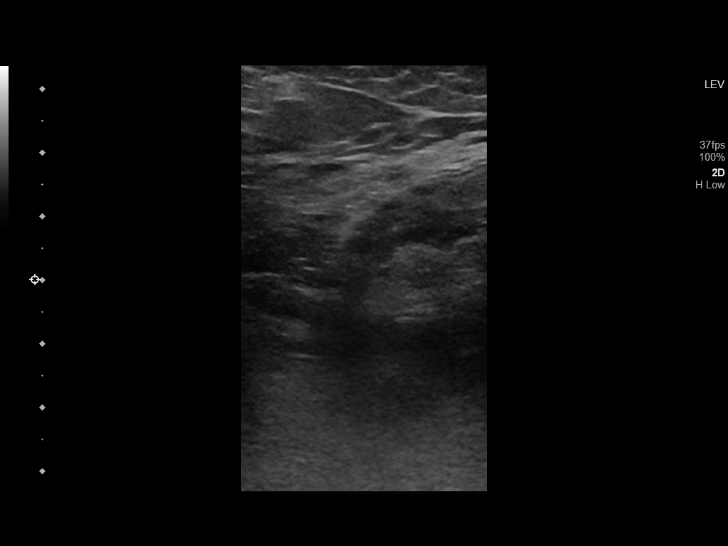
[im 53/58]
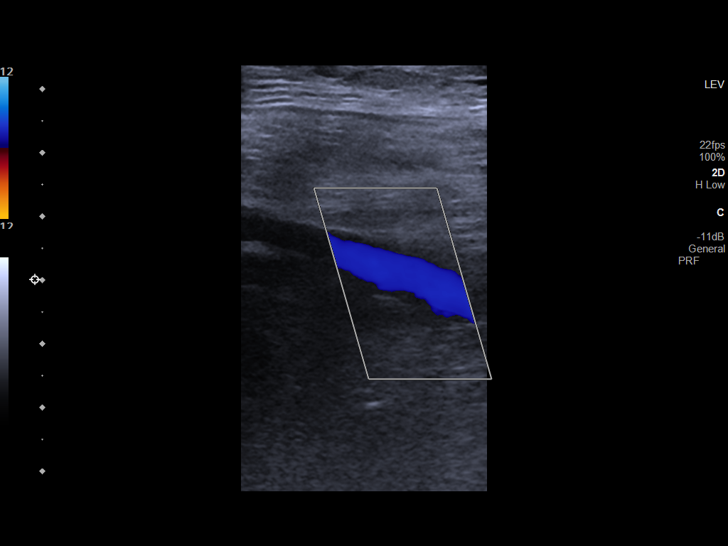
[im 58/58]
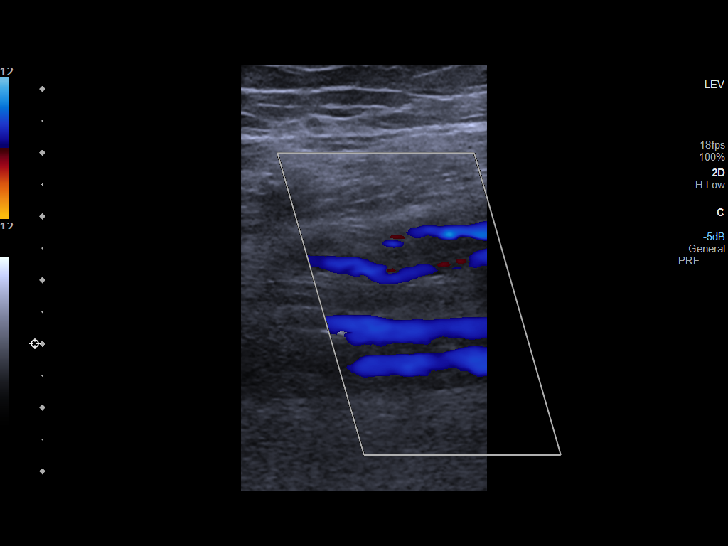

[13 of 24 positions shown; findings below may reference images not displayed]

FINDINGS: RIGHT LOWER EXTREMITY

Common Femoral Vein: No evidence of thrombus. Normal
compressibility, respiratory phasicity and response to augmentation.

Saphenofemoral Junction: No evidence of thrombus. Normal
compressibility and flow on color Doppler imaging.

Profunda Femoral Vein: No evidence of thrombus. Normal
compressibility and flow on color Doppler imaging.

Femoral Vein: No evidence of thrombus. Normal compressibility,
respiratory phasicity and response to augmentation.

Popliteal Vein: No evidence of thrombus. Normal compressibility,
respiratory phasicity and response to augmentation.

Calf Veins: No evidence of thrombus. Normal compressibility and flow
on color Doppler imaging.

Other Findings: Thin nondistended right popliteal fossa Baker's cyst
measures 5.5 cm in length and 3.7 cm in diameter.

LEFT LOWER EXTREMITY

Common Femoral Vein: No evidence of thrombus. Normal
compressibility, respiratory phasicity and response to augmentation.

Saphenofemoral Junction: No evidence of thrombus. Normal
compressibility and flow on color Doppler imaging.

Profunda Femoral Vein: No evidence of thrombus. Normal
compressibility and flow on color Doppler imaging.

Femoral Vein: No evidence of thrombus. Normal compressibility,
respiratory phasicity and response to augmentation.

Popliteal Vein: No evidence of thrombus. Normal compressibility,
respiratory phasicity and response to augmentation.

Calf Veins: No evidence of thrombus. Normal compressibility and flow
on color Doppler imaging.
IMPRESSION: No evidence of deep venous thrombosis in either lower extremity.

Nondistended right popliteal fossa 5.5 cm Baker's cyst.

## 2024-02-07 ENCOUNTER — Encounter (INDEPENDENT_AMBULATORY_CARE_PROVIDER_SITE_OTHER): Payer: Self-pay

## 2024-10-12 ENCOUNTER — Encounter: Payer: Self-pay | Admitting: Emergency Medicine

## 2024-10-12 ENCOUNTER — Ambulatory Visit
Admission: EM | Admit: 2024-10-12 | Discharge: 2024-10-12 | Disposition: A | Discharge status: Home / Self Care | Attending: Family Medicine | Admitting: Family Medicine

## 2024-10-12 DIAGNOSIS — N3001 Acute cystitis with hematuria: Secondary | ICD-10-CM | POA: Diagnosis present

## 2024-10-12 LAB — POCT URINE DIPSTICK
Glucose, UA: NEGATIVE mg/dL
Nitrite, UA: NEGATIVE
Protein Ur, POC: >=300 mg/dL — AB
Spec Grav, UA: 1.01
Urobilinogen, UA: 0.2 U/dL
pH, UA: >=9 — AB

## 2024-10-12 MED ORDER — SULFAMETHOXAZOLE-TRIMETHOPRIM 400-80 MG PO TABS: 1.0000 | ORAL_TABLET | Freq: Two times a day (BID) | ORAL | 0 refills | Status: AC

## 2024-10-12 NOTE — ED Provider Notes (Signed)
 " [facility]    CSN: [id number] Arrival date & time: 10/12/24  1624      History   Chief Complaint Chief Complaint  Patient presents with   Dysuria     HPI HPI Alicia Garner is a 84 y.o. female.   History provided by pt's daughter    Alicia Garner presents for burning sensation with urination with bloody discharge seen in her underwear for the past 2 days.   No LMP recorded (lmp unknown). Patient is postmenopausal.         Past Medical History:  Diagnosis Date   Diabetes mellitus without complication ([name])    Hyperlipidemia    Hypertension    Stroke [facility])     Patient Active Problem List   Diagnosis Date Noted   Left-sided weakness 06/11/2023   Seizure-like activity ([name]) 06/11/2023   Type 2 diabetes mellitus with chronic kidney disease, without long-term current use of insulin  ([name]) 06/11/2023   Dyslipidemia 06/11/2023   Dysarthria 06/11/2023   Acute renal failure superimposed on stage 4 chronic kidney disease ([name]) 10/18/2022   HLD (hyperlipidemia) 10/18/2022   Chronic diastolic CHF (congestive heart failure) ([name]) 10/18/2022   Gout 10/18/2022   Type II diabetes mellitus with renal manifestations ([name]) 10/18/2022   Generalized weakness 10/18/2022   Normocytic anemia 10/18/2022   Bilateral foot pain 10/18/2022   CVA (cerebral vascular accident) ([name]) 10/11/2022   CKD (chronic kidney disease) stage 4, GFR 15-29 ml/min ([name]) 10/11/2022   Constipation 12/07/2021   Weakness 12/06/2021   Anemia of chronic disease 12/05/2021   Elevated liver function tests 12/04/2021   Pain in joint, multiple sites 12/04/2021   Type 2 diabetes mellitus with hyperlipidemia ([name]) 12/04/2021   Essential hypertension 12/04/2021   DNR (do not resuscitate) 12/04/2021    History reviewed. No pertinent surgical history.  OB History   No obstetric history on file.      Home Medications    Prior to Admission medications  Medication Sig Start Date End Date Taking?  Authorizing Provider  sulfamethoxazole-trimethoprim (BACTRIM) 400-80 MG tablet Take 1 tablet by mouth 2 (two) times daily for 5 days. 10/12/24 10/17/24 Yes Rozella Servello, DO  acetaminophen  (TYLENOL ) 650 MG CR tablet Take 650 mg by mouth every 8 (eight) hours as needed. 02/25/23   [provider]  amLODipine  (NORVASC ) 10 MG tablet Take 10 mg by mouth daily. 02/25/23   [provider]  aspirin  EC 81 MG tablet Take 81 mg by mouth daily. Swallow whole.    [provider]  Cholecalciferol 25 MCG (1000 UT) capsule Take by mouth. 12/22/18   [provider]  clopidogrel  (PLAVIX ) 75 MG tablet Take 1 tablet (75 mg total) by mouth daily. 06/14/23   Alexander, Natalie, DO  cyanocobalamin  1000 MCG tablet Take 1 tablet (1,000 mcg total) by mouth daily. 10/14/22   Amin, Sumayya, MD  diclofenac  Sodium (VOLTAREN ) 1 % GEL Apply 2 g topically 4 (four) times daily.    [provider]  levETIRAcetam  (KEPPRA ) 250 MG tablet Take 1 tablet (250 mg total) by mouth 2 (two) times daily. 09/06/23 11/05/23  Cyrena Mylar, MD  LORazepam  (LORAZEPAM  INTENSOL) 2 MG/ML concentrated solution Take 1 mL (2 mg total) by mouth once as needed for up to 1 dose for seizure. 06/14/23   Alexander, Natalie, DO  polyethylene glycol (MIRALAX  / GLYCOLAX ) 17 g packet Take 17 g by mouth daily as needed for moderate constipation. 12/08/21   Josette Ade, MD  polyethylene glycol (MIRALAX ) 17  g packet Take 17 g by mouth daily as needed. 02/25/23   [provider]  potassium chloride SA (KLOR-CON M) 20 MEQ tablet Take 20 mEq by mouth 2 (two) times daily. 03/23/23   [provider]  rosuvastatin  (CRESTOR ) 20 MG tablet Take 0.5 tablets (10 mg total) by mouth daily for 14 days, THEN 1 tablet (20 mg total) daily for 14 days. 06/15/23 07/13/23  Alexander, Natalie, DO  sitaGLIPtin (JANUVIA) 25 MG tablet Take 25 mg by mouth daily. 02/25/23   [provider]    Family History Family History  Problem  Relation Age of Onset   Hypertension Mother    Diabetes Mother    Diabetes Father    Hypertension Father     Social History Social History[1]   Allergies   Lisinopril   Review of Systems Review of Systems: :negative unless otherwise stated in HPI.      Physical Exam Triage Vital Signs ED Triage Vitals  Encounter Vitals Group     BP 10/12/24 1705 120/84     Girls Systolic BP Percentile --      Girls Diastolic BP Percentile --      Boys Systolic BP Percentile --      Boys Diastolic BP Percentile --      Pulse Rate 10/12/24 1705 (!) 102     Resp 10/12/24 1705 15     Temp 10/12/24 1705 98.2 F (36.8 C)     Temp Source 10/12/24 1705 Oral     SpO2 10/12/24 1705 99 %     Weight 10/12/24 1703 160 lb 15 oz (73 kg)     Height 10/12/24 1703 5' 4 (1.626 m)     Head Circumference --      Peak Flow --      Pain Score 10/12/24 1703 0     Pain Loc --      Pain Education --      Exclude from Growth Chart --    No data found.  Updated Vital Signs BP 120/84 (BP Location: Left Arm)   Pulse (!) 102   Temp 98.2 F (36.8 C) (Oral)   Resp 15   Ht 5' 4 (1.626 m)   Wt 73 kg   LMP  (LMP Unknown)   SpO2 99%   BMI 27.62 kg/m   Visual Acuity Right Eye Distance:   Left Eye Distance:   Bilateral Distance:    Right Eye Near:   Left Eye Near:    Bilateral Near:     Physical Exam GEN: well appearing female in no acute distress  CVS: well perfused  RESP: speaking in full sentences without pause  ABD: soft, non-tender, non-distended, no palpable masses, no CVA tenderness      UC Treatments / Results  Labs (all labs ordered are listed, but only abnormal results are displayed) Labs Reviewed  POCT URINE DIPSTICK - Abnormal; Notable for the following components:      Result Value   Clarity, UA cloudy (*)    Bilirubin, UA small (*)    Ketones, POC UA trace (5) (*)    Blood, UA moderate (*)    pH, UA >=9.0 (*)    Protein Ur, POC >=300 (*)    Leukocytes, UA Large (3+)  (*)    All other components within normal limits  URINE CULTURE    EKG   Radiology No results found.  Procedures Procedures (including critical care time)  Medications Ordered in UC Medications - No  data to display  Initial Impression / Assessment and Plan / UC Course  I have reviewed the triage vital signs and the nursing notes.  Pertinent labs & imaging results that were available during my care of the patient were reviewed by me and considered in my medical decision making (see chart for details).      Patient is a 84 y.o. female  who presents for 2 days of dysuria.  Overall patient is well-appearing and afebrile.  Vital signs stable.   POC urine dipstick consistent with acute cystitis.   Hematuria  present but no  microscopy available.   Treat with renally dosed Bactrim 2 times daily for 5 days.  Urine culture obtained.  Follow-up sensitivities and change antibiotics, if needed.   Return precautions including abdominal pain, fever, chills, nausea, or vomiting given. Follow-up,  if symptoms not improving or getting worse. Discussed MDM, treatment plan and plan for follow-up with daughter who agrees with plan.        Final Clinical Impressions(s) / UC Diagnoses   Final diagnoses:  Acute cystitis with hematuria     Discharge Instructions      You have a urinary tract infection. I sent your urine for culture to be sure the antibiotic prescribed will treat your infection. Someone may call you to change antibiotics. Stop by the pharmacy to pick up your prescriptions.  Follow up with your primary care provider or return to the urgent care, if not improving.      ED Prescriptions     Medication Sig Dispense Auth. Provider   sulfamethoxazole-trimethoprim (BACTRIM) 400-80 MG tablet Take 1 tablet by mouth 2 (two) times daily for 5 days. 10 tablet Salif Tay, DO      PDMP not reviewed this encounter.     [1]  Social History Tobacco Use   Smoking status:  Never   Smokeless tobacco: Never  Vaping Use   Vaping status: Never Used  Substance Use Topics   Alcohol use: Never   Drug use: Never     Kriste Berth, DO 10/12/24 1739  "

## 2024-10-12 NOTE — Discharge Instructions (Signed)
 You have a urinary tract infection. I sent your urine for culture to be sure the antibiotic prescribed will treat your infection. Someone may call you to change antibiotics. Stop by the pharmacy to pick up your prescriptions.  Follow up with your primary care provider or return to the urgent care, if not improving.

## 2024-10-12 NOTE — ED Triage Notes (Signed)
 Patient c/o dysuria that started 2 days ago.  Patient unsure of fevers.  Patient is here with her daughter.

## 2024-10-13 LAB — URINE CULTURE: Special Requests: NORMAL

## 2024-10-17 ENCOUNTER — Ambulatory Visit (HOSPITAL_COMMUNITY): Payer: Self-pay
